# Patient Record
Sex: Female | Born: 1960 | Race: Black or African American | Hispanic: No | Marital: Married | Smoking: Never smoker
Health system: Southern US, Community
[De-identification: ages and names within clinical notes are randomized; demographics above are authoritative.]

## PROBLEM LIST (undated history)

## (undated) DIAGNOSIS — R51 Headache: Secondary | ICD-10-CM

## (undated) DIAGNOSIS — G8929 Other chronic pain: Secondary | ICD-10-CM

## (undated) DIAGNOSIS — E119 Type 2 diabetes mellitus without complications: Secondary | ICD-10-CM

## (undated) DIAGNOSIS — T7840XA Allergy, unspecified, initial encounter: Secondary | ICD-10-CM

## (undated) DIAGNOSIS — R519 Headache, unspecified: Secondary | ICD-10-CM

## (undated) DIAGNOSIS — D649 Anemia, unspecified: Secondary | ICD-10-CM

## (undated) DIAGNOSIS — I10 Essential (primary) hypertension: Secondary | ICD-10-CM

## (undated) DIAGNOSIS — E7800 Pure hypercholesterolemia, unspecified: Secondary | ICD-10-CM

## (undated) HISTORY — DX: Anemia, unspecified: D64.9

## (undated) HISTORY — DX: Type 2 diabetes mellitus without complications: E11.9

## (undated) HISTORY — DX: Pure hypercholesterolemia, unspecified: E78.00

## (undated) HISTORY — DX: Headache, unspecified: R51.9

## (undated) HISTORY — DX: Headache: R51

## (undated) HISTORY — DX: Essential (primary) hypertension: I10

## (undated) HISTORY — PX: TONSILLECTOMY: SUR1361

## (undated) HISTORY — DX: Allergy, unspecified, initial encounter: T78.40XA

## (undated) HISTORY — DX: Other chronic pain: G89.29

## (undated) HISTORY — PX: TONSILECTOMY/ADENOIDECTOMY WITH MYRINGOTOMY: SHX6125

---
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
## 1994-01-09 HISTORY — PX: TUBAL LIGATION: SHX77

## 2004-03-10 ENCOUNTER — Ambulatory Visit: Payer: Self-pay | Admitting: Internal Medicine

## 2004-08-05 ENCOUNTER — Ambulatory Visit: Payer: Self-pay | Admitting: Internal Medicine

## 2005-05-24 ENCOUNTER — Ambulatory Visit: Payer: Self-pay | Admitting: Internal Medicine

## 2006-11-06 ENCOUNTER — Ambulatory Visit: Payer: Self-pay | Admitting: Internal Medicine

## 2006-11-21 ENCOUNTER — Emergency Department: Payer: Self-pay | Admitting: Emergency Medicine

## 2007-11-27 ENCOUNTER — Ambulatory Visit: Payer: Self-pay | Admitting: Internal Medicine

## 2007-12-09 ENCOUNTER — Ambulatory Visit: Payer: Self-pay | Admitting: Internal Medicine

## 2008-03-02 ENCOUNTER — Ambulatory Visit: Payer: Self-pay | Admitting: Unknown Physician Specialty

## 2008-03-12 ENCOUNTER — Ambulatory Visit: Payer: Self-pay | Admitting: Unknown Physician Specialty

## 2008-03-27 ENCOUNTER — Observation Stay: Payer: Self-pay | Admitting: Unknown Physician Specialty

## 2008-04-01 ENCOUNTER — Inpatient Hospital Stay: Payer: Self-pay | Admitting: Unknown Physician Specialty

## 2008-12-23 ENCOUNTER — Ambulatory Visit: Payer: Self-pay | Admitting: Internal Medicine

## 2009-01-09 HISTORY — PX: ABDOMINAL HYSTERECTOMY: SHX81

## 2009-12-29 ENCOUNTER — Ambulatory Visit: Payer: Self-pay | Admitting: Internal Medicine

## 2011-01-25 ENCOUNTER — Ambulatory Visit: Payer: Self-pay | Admitting: Unknown Physician Specialty

## 2011-12-01 ENCOUNTER — Telehealth: Payer: Self-pay | Admitting: Internal Medicine

## 2011-12-01 MED ORDER — PANTOPRAZOLE SODIUM 40 MG PO TBEC: 40.0000 mg | DELAYED_RELEASE_TABLET | Freq: Every day | ORAL | Status: DC

## 2011-12-01 NOTE — Telephone Encounter (Signed)
Called patient to let her know. Appt scheduled. Prescription sent in to [facility] and also [facility]

## 2011-12-01 NOTE — Telephone Encounter (Signed)
Pt called made appointment for march  Wanted  to see if she could get in sooner.  She is not feeling well. Low energy having pains in chest (acid reflex)  Pt had endoscopy in April Pt is out of proton ix [facility] [place]

## 2011-12-01 NOTE — Telephone Encounter (Signed)
I can see her on 12/04/11 at 8:30 - will need to put on schedule.  Will need 30 min.  I am ok to refill her protonix 40mg .  Need to confirm with pt she was taking q day.  Can refill x 3.  If she is having chest pain however, I do recommend her to go ahead to acute care and be evaluated today to confirm nothing more acute going on and then I can follow up with her on Monday.

## 2011-12-04 ENCOUNTER — Ambulatory Visit (INDEPENDENT_AMBULATORY_CARE_PROVIDER_SITE_OTHER): Payer: BC Managed Care – PPO | Admitting: Internal Medicine

## 2011-12-04 ENCOUNTER — Encounter: Payer: Self-pay | Admitting: Internal Medicine

## 2011-12-04 VITALS — BP 120/70 | HR 74 | Temp 98.6°F | Ht 60.0 in | Wt 185.2 lb

## 2011-12-04 DIAGNOSIS — R109 Unspecified abdominal pain: Secondary | ICD-10-CM

## 2011-12-04 DIAGNOSIS — R5381 Other malaise: Secondary | ICD-10-CM

## 2011-12-04 DIAGNOSIS — E119 Type 2 diabetes mellitus without complications: Secondary | ICD-10-CM

## 2011-12-04 DIAGNOSIS — E7800 Pure hypercholesterolemia, unspecified: Secondary | ICD-10-CM

## 2011-12-04 DIAGNOSIS — R5383 Other fatigue: Secondary | ICD-10-CM

## 2011-12-04 DIAGNOSIS — G4710 Hypersomnia, unspecified: Secondary | ICD-10-CM

## 2011-12-04 DIAGNOSIS — D649 Anemia, unspecified: Secondary | ICD-10-CM

## 2011-12-04 DIAGNOSIS — R400 Somnolence: Secondary | ICD-10-CM

## 2011-12-04 DIAGNOSIS — R079 Chest pain, unspecified: Secondary | ICD-10-CM

## 2011-12-04 DIAGNOSIS — I10 Essential (primary) hypertension: Secondary | ICD-10-CM

## 2011-12-04 DIAGNOSIS — K219 Gastro-esophageal reflux disease without esophagitis: Secondary | ICD-10-CM

## 2011-12-04 LAB — CBC WITH DIFFERENTIAL/PLATELET
Basophils Absolute: 0 10*3/uL (ref 0.0–0.1)
Basophils Relative: 0.3 % (ref 0.0–3.0)
Eosinophils Relative: 1.2 % (ref 0.0–5.0)
Hemoglobin: 12.4 g/dL (ref 12.0–15.0)
Lymphocytes Relative: 42.4 % (ref 12.0–46.0)
Monocytes Relative: 5.3 % (ref 3.0–12.0)
Neutro Abs: 2.8 10*3/uL (ref 1.4–7.7)
RBC: 4.3 Mil/uL (ref 3.87–5.11)
WBC: 5.6 10*3/uL (ref 4.5–10.5)

## 2011-12-04 LAB — BASIC METABOLIC PANEL
CO2: 29 mEq/L (ref 19–32)
Chloride: 103 mEq/L (ref 96–112)
Creatinine, Ser: 0.8 mg/dL (ref 0.4–1.2)
Glucose, Bld: 110 mg/dL — ABNORMAL HIGH (ref 70–99)

## 2011-12-04 LAB — LDL CHOLESTEROL, DIRECT: Direct LDL: 165.7 mg/dL

## 2011-12-04 LAB — LIPID PANEL
Cholesterol: 219 mg/dL — ABNORMAL HIGH (ref 0–200)
Total CHOL/HDL Ratio: 6
Triglycerides: 111 mg/dL (ref 0.0–149.0)
VLDL: 22.2 mg/dL (ref 0.0–40.0)

## 2011-12-04 LAB — HEPATIC FUNCTION PANEL
ALT: 14 U/L (ref 0–35)
Bilirubin, Direct: 0 mg/dL (ref 0.0–0.3)
Total Protein: 7.7 g/dL (ref 6.0–8.3)

## 2011-12-04 MED ORDER — PANTOPRAZOLE SODIUM 40 MG PO TBEC: 40.0000 mg | DELAYED_RELEASE_TABLET | Freq: Every day | ORAL | Status: DC

## 2011-12-04 MED ORDER — FLUTICASONE PROPIONATE 50 MCG/ACT NA SUSP: 2.0000 | Freq: Every day | NASAL | Status: DC

## 2011-12-04 MED ORDER — FELODIPINE ER 5 MG PO TB24: 5.0000 mg | ORAL_TABLET | Freq: Every day | ORAL | Status: DC

## 2011-12-04 NOTE — Patient Instructions (Addendum)
It was nice seeing you today.  I am sorry you have not been feeling well.  I want you to continue your Protonix.  We will get your sleep study, stress test and referral to GI - scheduled.  Let me know if you have any problems.

## 2011-12-04 NOTE — Progress Notes (Signed)
Subjective:    Patient ID: Courtney Donovan, female    DOB: 13-Feb-1960, 51 y.o.   MRN: [id number]  HPI 51 year old female with past history of hypertension, hypercholesterolemia, anemia and GERD who comes in today as a work in with concerns regarding increased acid reflux.  She reports she has been out of her protonix.  Had EGD 4/13 that revealed mild gastritis.  She has noticed burning in her chest and food "coming up".  When she would eat - would feel bad.  Feels better now that she is back on Protonix.  Still with some faint intermittent chest discomfort.  She has also noticed getting more sob with going up stairs.  Will notice some tightness occasionally with exertion - as well.  Occasional nausea, no vomiting.  Does report she feels she has a belt around her lower back and lower abdomen - at times.  Bowels stable.   Not sleeping well.  Wakes up - not feeling rested.  No increased cough.  Headaches started two weeks ago.    Past Medical History  Diagnosis Date  . Hypertension   . Hypercholesterolemia   . Chronic headaches   . Anemia   . Diabetes mellitus     diet controlled   .   Review of Systems Patient denies any lightheadedness or dizziness currently.  Headache as outlined.  Frontal.  No vision change.  No significant sinus congestion.  Some nasal dryness.  Planning to get her eyes checked soon.  Chest pain as outlined.  No palpitations.  No increased cough or congestion.  Does report some sob with exertion.  No vomiting. Does report some intermittent nausea.  Abdominal discomfort as outlined.  No bowel change, such as diarrhea, constipation, BRBPR or melana.  No urine change.  Acid reflux better now that she has restarted the Protonix.       Objective:   Physical Exam Filed Vitals:   12/04/11 0841  BP: 120/70  Pulse: 74  Temp: 98.6 F (59 C)   51 year old female in no acute distress.   HEENT:  Nares - clear.  OP- without lesions or erythema.  Minimal tenderness to  palpation over the sinus.  No temporal artery tenderness.  TMs without erythema.   NECK:  Supple, nontender.  No audible bruit.   HEART:  Appears to be regular. LUNGS:  Without crackles or wheezing audible.  Respirations even and unlabored.   RADIAL PULSE:  Equal bilaterally.  ABDOMEN:  Soft, nontender.  No audible abdominal bruit.   EXTREMITIES:  No increased edema to be present.                     Assessment & Plan:  CARDIOVASCULAR.  With the chest discomfort as outlined - EKG obtained and revealed SR with no acute ischemic changes.  Given symptoms and risk factors, will go ahead and obtain a stress test to confirm no ischemia.  Continue risk factor modification.    POSSIBLE SLEEP APNEA.  Describes headaches.  Does not feel rested when she wakes.  Daytime somnolence.  Obtain split night sleep study to further evaluate.    FATIGUE.  Check cbc, met c and tsh.   INCREASED PSYCHOSOCIAL STRESSORS.  Off Citalopram.  Discussed restarting.  Follow.    GYN.  Is s/p supracervical hysterectomy.  Previously saw Dr Harold Hedge.  States she is up to date with her pelvics and paps.  Follow.    HEALTH MAINTENANCE.  Pelvic and pap -  through GYN.  Need copy of last mammogram.  Was referred to GI for colon cancer screening - since due.  They performed an EGD.  Will refer back for a colonoscopy.

## 2011-12-05 ENCOUNTER — Telehealth: Payer: Self-pay | Admitting: Internal Medicine

## 2011-12-05 NOTE — Telephone Encounter (Signed)
Need to add an a1c (hyperglycemia - 790.6) to her labs yesterday.  Thanks.

## 2011-12-06 ENCOUNTER — Other Ambulatory Visit (INDEPENDENT_AMBULATORY_CARE_PROVIDER_SITE_OTHER): Payer: BC Managed Care – PPO

## 2011-12-06 ENCOUNTER — Other Ambulatory Visit: Payer: Self-pay | Admitting: Internal Medicine

## 2011-12-06 DIAGNOSIS — E119 Type 2 diabetes mellitus without complications: Secondary | ICD-10-CM

## 2011-12-06 DIAGNOSIS — E7800 Pure hypercholesterolemia, unspecified: Secondary | ICD-10-CM

## 2011-12-06 LAB — HEMOGLOBIN A1C: Hgb A1c MFr Bld: 6.4 % (ref 4.6–6.5)

## 2011-12-08 ENCOUNTER — Telehealth: Payer: Self-pay | Admitting: *Deleted

## 2011-12-08 MED ORDER — PRAVASTATIN SODIUM 10 MG PO TABS: 10.0000 mg | ORAL_TABLET | Freq: Every day | ORAL | Status: DC

## 2011-12-08 NOTE — Telephone Encounter (Signed)
Appointment scheduled. Sent prescription in to pharmacy.

## 2011-12-08 NOTE — Progress Notes (Signed)
Called and gave lab results to patient. Appointment scheduled. Sent prescription in to pharmacy.

## 2011-12-09 ENCOUNTER — Encounter: Payer: Self-pay | Admitting: Internal Medicine

## 2011-12-09 DIAGNOSIS — E119 Type 2 diabetes mellitus without complications: Secondary | ICD-10-CM | POA: Insufficient documentation

## 2011-12-09 DIAGNOSIS — K219 Gastro-esophageal reflux disease without esophagitis: Secondary | ICD-10-CM | POA: Insufficient documentation

## 2011-12-09 NOTE — Assessment & Plan Note (Signed)
Worsening reflux recently.  EGD 4/13 revealed mild gastritis.  She recently was out of her Protonix.  Noticed burning and food coming up. Back on Protonix now.  Better.  Follow.

## 2011-12-09 NOTE — Assessment & Plan Note (Signed)
Check cbc.  Off iron.  Is s/p hysterectomy now.  Follow. GI w/up planned and as outlined.

## 2011-12-09 NOTE — Assessment & Plan Note (Signed)
Low cholesterol diet and exercise.  Check lipid panel with next labs.  

## 2011-12-09 NOTE — Assessment & Plan Note (Signed)
Blood pressure as outlined.  Have her spot check her pressures.  Get her back in soon to reassess.  Check metabolic panel.

## 2011-12-09 NOTE — Assessment & Plan Note (Signed)
Diet controlled.  Low carb diet and exercise.  Check met b and a1c.    

## 2011-12-11 ENCOUNTER — Emergency Department: Payer: Self-pay | Admitting: Emergency Medicine

## 2011-12-11 LAB — HEPATIC FUNCTION PANEL A (ARMC)
Albumin: 3.9 g/dL (ref 3.4–5.0)
Alkaline Phosphatase: 94 U/L (ref 50–136)
Bilirubin, Direct: <0.1 mg/dL (ref 0.00–0.20)
Bilirubin,Total: 0.2 mg/dL (ref 0.2–1.0)
SGOT(AST): 16 U/L (ref 15–37)
SGPT (ALT): 18 U/L (ref 12–78)
Total Protein: 7.6 g/dL (ref 6.4–8.2)

## 2011-12-11 LAB — BASIC METABOLIC PANEL
Anion Gap: 4 — ABNORMAL LOW (ref 7–16)
Calcium, Total: 8.3 mg/dL — ABNORMAL LOW (ref 8.5–10.1)
Chloride: 105 mmol/L (ref 98–107)
Co2: 28 mmol/L (ref 21–32)
Osmolality: 274 (ref 275–301)
Potassium: 3.9 mmol/L (ref 3.5–5.1)

## 2011-12-11 LAB — CK TOTAL AND CKMB (NOT AT ARMC)
CK, Total: 118 U/L (ref 21–215)
CK-MB: 0.8 ng/mL (ref 0.5–3.6)

## 2011-12-11 LAB — CBC
HCT: 35.7 % (ref 35.0–47.0)
HGB: 12 g/dL (ref 12.0–16.0)
MCH: 29.2 pg (ref 26.0–34.0)
MCHC: 33.5 g/dL (ref 32.0–36.0)
MCV: 87 fL (ref 80–100)
Platelet: 215 10*3/uL (ref 150–440)
RBC: 4.1 10*6/uL (ref 3.80–5.20)
RDW: 13.5 % (ref 11.5–14.5)
WBC: 9.4 10*3/uL (ref 3.6–11.0)

## 2011-12-11 LAB — TROPONIN I: Troponin-I: <0.02 ng/mL

## 2011-12-12 ENCOUNTER — Telehealth: Payer: Self-pay | Admitting: Internal Medicine

## 2011-12-12 NOTE — Telephone Encounter (Signed)
Please call and confirm pt doing ok and let her know we are waiting on records.  Hold message until we receive

## 2011-12-12 NOTE — Telephone Encounter (Signed)
Pt was recently in [facility] and wanted to know if a nurse could call her back.

## 2011-12-12 NOTE — Telephone Encounter (Signed)
Patient was taken to [facility] 12/11/11 with chest pains.  [facility] performed an EKG and lab work, which came back as ok. [facility] wanted patient to follow-up with you. Faxed request for er records.

## 2011-12-13 NOTE — Telephone Encounter (Signed)
Called patient, she is agreeable for the 12:00 pm appointment for 12/5

## 2011-12-13 NOTE — Telephone Encounter (Signed)
Pt calling back and states that she is going to try to go back to work today and would like a call as soon as possible.

## 2011-12-13 NOTE — Telephone Encounter (Signed)
She said that she was going to rest today and tomorrow's appt was fine.

## 2011-12-13 NOTE — Telephone Encounter (Signed)
Please call pt and inform her we were waiting on records and make sure she is doing ok - see yesterdays message. I can see her tomorrow at 12:00 - workin for ER follow up.  Will need to schedule appt or send to robin or jamie to put on schedule.  If any acute problems - let me know.  She needs a call toay.

## 2011-12-13 NOTE — Telephone Encounter (Signed)
Was she fine with waiting until tomorrow.  If not, then acute care today - since I am not here this pm.

## 2011-12-13 NOTE — Telephone Encounter (Signed)
Patient is still having same sx,

## 2011-12-14 ENCOUNTER — Encounter: Payer: Self-pay | Admitting: Internal Medicine

## 2011-12-14 ENCOUNTER — Ambulatory Visit (INDEPENDENT_AMBULATORY_CARE_PROVIDER_SITE_OTHER): Payer: BC Managed Care – PPO | Admitting: Internal Medicine

## 2011-12-14 ENCOUNTER — Encounter: Payer: Self-pay | Admitting: General Practice

## 2011-12-14 ENCOUNTER — Other Ambulatory Visit: Payer: Self-pay | Admitting: *Deleted

## 2011-12-14 VITALS — BP 150/100 | HR 75 | Temp 98.9°F | Ht 60.0 in | Wt 181.5 lb

## 2011-12-14 DIAGNOSIS — E119 Type 2 diabetes mellitus without complications: Secondary | ICD-10-CM

## 2011-12-14 DIAGNOSIS — K219 Gastro-esophageal reflux disease without esophagitis: Secondary | ICD-10-CM

## 2011-12-14 DIAGNOSIS — D649 Anemia, unspecified: Secondary | ICD-10-CM

## 2011-12-14 DIAGNOSIS — I10 Essential (primary) hypertension: Secondary | ICD-10-CM

## 2011-12-14 DIAGNOSIS — R079 Chest pain, unspecified: Secondary | ICD-10-CM

## 2011-12-14 MED ORDER — LORAZEPAM 0.5 MG PO TABS: ORAL_TABLET | ORAL | Status: DC

## 2011-12-14 NOTE — Progress Notes (Signed)
Subjective:    Patient ID: Courtney Donovan, female    DOB: 03-Dec-1960, 51 y.o.   MRN: [id number]  HPI  51 year old female with past history of hypertension, hypercholesterolemia, anemia and GERD who comes in today as a work in for an ER follow up.  She was seen in the ER on 12/11/11 for near syncope. She reports she was driving home from work and felt a sensation in her lower abdomen that came up over her.  Felt like she was going to pass out.  EMS called.  Unsure if she actually lost consciousness.  She was able to talk and answer questions, but she states she did not feel right and was out of sorts.  Was taken to the ER.  Lab work unrevealing.  EKG revealed nonspecific T wave abnormality in the anterior leads and prolonged QT.  Was discharged to follow up here.  On questioning her, she has had intermittent chest discomfort - mostly right sided.  Some intermittent palpitations and increased heart rate.  (started occurring prior to this episode).  Will feel a little light headed when this occurs.  She is still not back to her baseline.  She did have two episodes of emesis when she got home from the ER.  Has been eating bland foods since.  Has had not further emesis.  No diarrhea.  She had started back on her Celexa - the day this occurred.  ER instructed her to stop.    Past Medical History  Diagnosis Date  . Hypertension   . Hypercholesterolemia   . Chronic headaches   . Anemia   . Diabetes mellitus     diet controlled   . Current Outpatient Prescriptions on File Prior to Visit  Medication Sig Dispense Refill  . aspirin 81 MG chewable tablet Chew 81 mg by mouth daily.      . felodipine (PLENDIL) 5 MG 24 hr tablet Take 1 tablet (5 mg total) by mouth daily.  90 tablet  3  . fluticasone (FLONASE) 50 MCG/ACT nasal spray Place 2 sprays into the nose daily.  16 g  0  . Fluticasone Propionate (FLONASE NA) Place into the nose.      . pantoprazole (PROTONIX) 40 MG tablet Take 1 tablet (40 mg  total) by mouth daily.  90 tablet  3  . pravastatin (PRAVACHOL) 10 MG tablet Take 1 tablet (10 mg total) by mouth daily.  30 tablet  1      Review of Systems Has had some minimal headache.  No vision change or sinus symptoms.  She does describe the intermittent chest pain and palpitatioins as outlined.   No increased shortness of breath, cough or congestion.  Had the nausea and emesis as outlined.  Had been having increased acid reflux.  See last note for details.  Back on Protonix now.  Helping.  No bowel change, such as diarrhea, constipation, BRBPR or melana.  No urine change.        Objective:   Physical Exam  Filed Vitals:   12/14/11 1208  BP: 150/100  Pulse: 75  Temp: 98.9 F (37.2 C)   Blood pressure recheck:  6/71  51 year old female in no acute distress.   HEENT:  Nares - clear.  OP- without lesions or erythema.  No temporal artery tenderness.  TMs without erythema.   NECK:  Supple, nontender.  No audible bruit.   HEART:  Appears to be regular. LUNGS:  Without crackles or wheezing audible.  Respirations even and unlabored.   RADIAL PULSE:  Equal bilaterally.  ABDOMEN:  Soft, nontender.  No audible abdominal bruit.   EXTREMITIES:  No increased edema to be present.                     Assessment & Plan:  CARDIOVASCULAR.  Symptoms as outlined.  Given concern over possible prolonged QT, EKG repeated today.  Revealed ST with no prolonged QT and no acute ischemic changes.  Unclear as to the exact etiology.  Given the persistent intermittent chest pain, palpitations, increased heart rate and associated light headedness - I do feel further cardiac w/up is warranted.  We had already discussed doing a stress echo - last visit.  Will instead, refer her to cardiology and let them evaluate her prior to any testing - for consideration of stress test, holter monitor and/or ECHO.  Pt comfortable with this plan.  Knows if she has any worsening change or problems, she is to be reevaluated.  Treat the anxiety and GERD as outlined.  I spent more that 25 minutes with the patient and more than 50% of the time was spent in consultation regarding the above.    ANXIETY.  We discussed the possibility of some of this being related to anxiety (or at least worsened by anxiety).  Will hold on the Celexa for now, until we can get the above worked up - regarding her heart.  I did give her a Rx for Lorazepam .5mg  (instructed to take 1/2 tab bid prn) - if needed.  Will follow closely.    POSSIBLE VIRAL SYNDROME.  She did have some nausea and emesis associated with the above episode.  Eating bland foods now.  No diarrhea.  Advance diet as tolerated.  Follow.  Stay hydrated. If persistent flares, consider abdominal ultrasound to confirm no gallbladder etiology, etc.      POSSIBLE SLEEP APNEA.  See last visit.  Scheduled for a split night sleep study.       HEALTH MAINTENANCE.  Pelvic and pap - through GYN.  Need copy of last mammogram.  Was referred to GI for colon cancer screening - since due.  They performed an EGD.  Will refer back for a colonoscopy.

## 2011-12-14 NOTE — Telephone Encounter (Signed)
Sent in to pharmacy.  

## 2011-12-15 ENCOUNTER — Encounter: Payer: Self-pay | Admitting: Internal Medicine

## 2011-12-15 NOTE — Assessment & Plan Note (Signed)
Had worsening acid reflux recently.  Back on Protonix now.  Symptoms have improved.  Already referred back to GI.  Follow.  Question if any of this could have contributed to her recent "episode".

## 2011-12-15 NOTE — Assessment & Plan Note (Signed)
Recent cbc wnl.  Follow.   

## 2011-12-15 NOTE — Assessment & Plan Note (Signed)
Blood pressure looks good on my check.  Continue same meds.  Follow.

## 2011-12-15 NOTE — Assessment & Plan Note (Signed)
Sugars controlled.  Follow.  No lows.

## 2011-12-25 ENCOUNTER — Telehealth: Payer: Self-pay | Admitting: Internal Medicine

## 2011-12-25 NOTE — Telephone Encounter (Signed)
I don't make referrals to chiropractor.  Why does she want to cx sleep study.  i would like for her to follow through with sleep study.  If having more msk issues - I can reassess and see about referral to physical therapy.

## 2011-12-25 NOTE — Telephone Encounter (Signed)
Has not meet her deductible, she wants to wait until after new year so she want have to pay twice for same treatment.  She wants to know what she has to do to get sleep study done in home. She is still having head and neck pain that is worse. She said that there are xr's and reports in her records.

## 2011-12-25 NOTE — Telephone Encounter (Signed)
Pt is needing referral to Chiropractor and she wants to cancel sleep study and wanted to let Dr. Lorin Picket know.

## 2011-12-26 NOTE — Telephone Encounter (Signed)
Reviewed messages.  I can refer her to physical therapy for neck pain.  Let me know if she is agreeable to this and i can place order for referral.  She cannot get a full sleep study at home.  What can be done at home is an overnight oximetry - to measure oxygen level while she is sleeping.  If she is unable to get the sleep study now, I can talk with her more about this at next visit.  Notify her to avoid lying flat on her back and avoid sedating medication.

## 2011-12-26 NOTE — Telephone Encounter (Signed)
Called patient to let her know. She said that she would get back in touch with [place] to let us know if she wanted physical therapy.

## 2012-01-19 ENCOUNTER — Other Ambulatory Visit: Payer: BC Managed Care – PPO

## 2012-01-29 ENCOUNTER — Other Ambulatory Visit (INDEPENDENT_AMBULATORY_CARE_PROVIDER_SITE_OTHER): Payer: BC Managed Care – PPO

## 2012-01-29 ENCOUNTER — Ambulatory Visit: Payer: Self-pay | Admitting: Internal Medicine

## 2012-01-29 DIAGNOSIS — E7800 Pure hypercholesterolemia, unspecified: Secondary | ICD-10-CM

## 2012-01-29 LAB — HEPATIC FUNCTION PANEL
ALT: 16 U/L (ref 0–35)
AST: 17 U/L (ref 0–37)
Alkaline Phosphatase: 78 U/L (ref 39–117)
Bilirubin, Direct: 0 mg/dL (ref 0.0–0.3)
Total Bilirubin: 0.7 mg/dL (ref 0.3–1.2)
Total Protein: 7.7 g/dL (ref 6.0–8.3)

## 2012-01-29 LAB — LIPID PANEL
Cholesterol: 195 mg/dL (ref 0–200)
VLDL: 19.6 mg/dL (ref 0.0–40.0)

## 2012-01-30 ENCOUNTER — Encounter: Payer: Self-pay | Admitting: Internal Medicine

## 2012-01-30 DIAGNOSIS — K219 Gastro-esophageal reflux disease without esophagitis: Secondary | ICD-10-CM

## 2012-01-31 ENCOUNTER — Other Ambulatory Visit: Payer: Self-pay | Admitting: *Deleted

## 2012-01-31 MED ORDER — PRAVASTATIN SODIUM 20 MG PO TABS: 20.0000 mg | ORAL_TABLET | Freq: Every day | ORAL | Status: DC

## 2012-02-07 ENCOUNTER — Encounter: Payer: Self-pay | Admitting: Internal Medicine

## 2012-03-08 ENCOUNTER — Encounter: Payer: Self-pay | Admitting: *Deleted

## 2012-03-12 ENCOUNTER — Ambulatory Visit: Payer: Self-pay | Admitting: Internal Medicine

## 2012-03-12 ENCOUNTER — Telehealth: Payer: Self-pay | Admitting: *Deleted

## 2012-03-12 NOTE — Telephone Encounter (Signed)
Patient wants to know what you would recommend for her to take over the counter for headache, sinus drainage, and sore nose. Patient stated that she does not have any other symptoms.

## 2012-03-12 NOTE — Telephone Encounter (Signed)
Patient called to speak with Marcelino Duster. She did not state what the call was regarding. Will route

## 2012-03-12 NOTE — Telephone Encounter (Signed)
She can use her flonase nasal spray - 2 sprays each nostril q day - do in the evening.  Also saline nasal flushes - flush nose at least 2-3 x/day.  If persistent sx, needs evaluation.  She missed her appt today.

## 2012-03-12 NOTE — Telephone Encounter (Signed)
Left doctors recommendation on patients vm

## 2012-04-04 ENCOUNTER — Other Ambulatory Visit: Payer: Self-pay | Admitting: *Deleted

## 2012-04-05 ENCOUNTER — Other Ambulatory Visit: Payer: Self-pay | Admitting: *Deleted

## 2012-04-05 MED ORDER — LORAZEPAM 0.5 MG PO TABS: ORAL_TABLET | ORAL | Status: DC

## 2012-04-05 MED ORDER — PANTOPRAZOLE SODIUM 40 MG PO TBEC: 40.0000 mg | DELAYED_RELEASE_TABLET | Freq: Every day | ORAL | Status: DC

## 2012-04-05 NOTE — Telephone Encounter (Signed)
Med filled.  

## 2012-04-12 ENCOUNTER — Other Ambulatory Visit: Payer: Self-pay | Admitting: *Deleted

## 2012-04-12 ENCOUNTER — Telehealth: Payer: Self-pay | Admitting: Internal Medicine

## 2012-04-12 MED ORDER — PANTOPRAZOLE SODIUM 40 MG PO TBEC: 40.0000 mg | DELAYED_RELEASE_TABLET | Freq: Every day | ORAL | Status: DC

## 2012-04-12 NOTE — Telephone Encounter (Signed)
Pt was wanting to know if you could call her in some Protinics 40 mg to [facility] until she recieves from [facility] because she is having chest pains and she is completely out.

## 2012-04-12 NOTE — Telephone Encounter (Addendum)
Patient wanted to know if you could fit her in on 4/17 in the morning?

## 2012-04-12 NOTE — Telephone Encounter (Signed)
I am ok to refill her med. She does need a follow up appt with me.  Ok for her to continue her cholesterol med.

## 2012-04-12 NOTE — Telephone Encounter (Addendum)
Patient said that she is having indigestion. Patient was wanting enough to get her by until mail order comes. Patient also wanted to know if it is ok for her to take her cholesterol med with vivelle patch 0.05 mg? Another physician that she is seeing put her on the patch and she says that she is doing fine on it but she has not been taking her cholesterol med since she stated the patch.

## 2012-04-12 NOTE — Addendum Note (Signed)
Addended by: Marlene Lard on: 04/12/2012 05:03 PM   Modules accepted: Orders

## 2012-04-12 NOTE — Telephone Encounter (Signed)
Rx sent in to pharmacy. 

## 2012-04-12 NOTE — Telephone Encounter (Signed)
I am ok to refill her protonix, but need to know more about her chest pain.  If having acute chest pain - needs eval today.  Also she needs a follow up appt with me.

## 2012-04-14 NOTE — Telephone Encounter (Signed)
I can see her at 11:45 on 04/25/12.  Will need to block the 9:45 spot.  Thanks.

## 2012-04-15 ENCOUNTER — Other Ambulatory Visit: Payer: Self-pay | Admitting: *Deleted

## 2012-04-15 NOTE — Telephone Encounter (Signed)
She was just given 20 on 04/05/12.  How often is she taking?  Let me know.

## 2012-04-25 ENCOUNTER — Ambulatory Visit (INDEPENDENT_AMBULATORY_CARE_PROVIDER_SITE_OTHER): Payer: BC Managed Care – PPO | Admitting: Internal Medicine

## 2012-04-25 ENCOUNTER — Encounter: Payer: Self-pay | Admitting: Internal Medicine

## 2012-04-25 VITALS — BP 100/60 | HR 83 | Temp 98.5°F | Ht 60.0 in | Wt 188.5 lb

## 2012-04-25 DIAGNOSIS — I10 Essential (primary) hypertension: Secondary | ICD-10-CM

## 2012-04-25 DIAGNOSIS — E119 Type 2 diabetes mellitus without complications: Secondary | ICD-10-CM

## 2012-04-25 DIAGNOSIS — E7800 Pure hypercholesterolemia, unspecified: Secondary | ICD-10-CM

## 2012-04-25 DIAGNOSIS — D649 Anemia, unspecified: Secondary | ICD-10-CM

## 2012-04-25 DIAGNOSIS — K219 Gastro-esophageal reflux disease without esophagitis: Secondary | ICD-10-CM

## 2012-04-27 ENCOUNTER — Encounter: Payer: Self-pay | Admitting: Internal Medicine

## 2012-04-27 NOTE — Assessment & Plan Note (Signed)
Recent cbc wnl.  Follow.   

## 2012-04-27 NOTE — Assessment & Plan Note (Signed)
Blood pressure looks good on my check.  Continue same meds.  Follow.

## 2012-04-27 NOTE — Assessment & Plan Note (Signed)
No reflux.  Doing well on current regimen.  Follow.

## 2012-04-27 NOTE — Progress Notes (Signed)
  Subjective:    Patient ID: Courtney Donovan, female    DOB: 1960/04/16, 52 y.o.   MRN: [id number]  HPI 52 year old female with past history of hypertension, hypercholesterolemia, anemia and GERD who comes in today for a scheduled follow up.  She states she is doing better.  Feels better.  Using Vivelle dot.  Sleeping better.  No chest pain or tightness.  Breathing stable.  No acid reflux.  Bowels stable.    Past Medical History  Diagnosis Date  . Hypertension   . Hypercholesterolemia   . Chronic headaches   . Anemia   . Diabetes mellitus     diet controlled  . Allergy    . Current Outpatient Prescriptions on File Prior to Visit  Medication Sig Dispense Refill  . aspirin 81 MG chewable tablet Chew 81 mg by mouth daily.      . felodipine (PLENDIL) 5 MG 24 hr tablet Take 1 tablet (5 mg total) by mouth daily.  90 tablet  3  . fluticasone (FLONASE) 50 MCG/ACT nasal spray Place 2 sprays into the nose daily.  16 g  0  . LORazepam (ATIVAN) 0.5 MG tablet 1/2 tablet bid prn  20 tablet  0  . pantoprazole (PROTONIX) 40 MG tablet Take 1 tablet (40 mg total) by mouth daily.  15 tablet  0   No current facility-administered medications on file prior to visit.      Review of Systems Patient denies any headache, lightheadedness or dizziness.  No sinus or allergy symptoms.  No chest pain, tightness or palpitations.  No increased shortness of breath, cough or congestion.  No nausea or vomiting.  No abdominal pain or cramping.  No bowel change, such as diarrhea, constipation, BRBPR or melana.  No urine change.  Sleeping better.  Seeing GYN.         Objective:   Physical Exam  Filed Vitals:   04/25/12 1157  BP: 100/60  Pulse: 83  Temp: 98.5 F (36.9 C)   Blood pressure recheck:  112/62, pulse 22  52 year old female in no acute distress.   HEENT:  Nares - clear.  OP- without lesions or erythema.   NECK:  Supple, nontender.  No audible bruit.   HEART:  Appears to be regular. LUNGS:   Without crackles or wheezing audible.  Respirations even and unlabored.   RADIAL PULSE:  Equal bilaterally.  ABDOMEN:  Soft, nontender.  No audible abdominal bruit.   EXTREMITIES:  No increased edema to be present.                     Assessment & Plan:  CARDIOVASCULAR.  Asymptomatic.  Continue risk factor modification.      ANXIETY. Doing better. Sleeping better.  Follow.      POSSIBLE SLEEP APNEA.  See previous visits.  Was scheduled for a split night sleep study.  They have contacted her.  She has not scheduled.  Sleeping better now.         HEALTH MAINTENANCE.  Pelvic and pap - through GYN.  Mammogram 01/29/12 - Birads I.  Was referred to GI for colon cancer screening - since due.  They performed an EGD.  Was referred back for a colonoscopy.

## 2012-04-27 NOTE — Assessment & Plan Note (Signed)
Diet controlled.  Low carb diet and exercise.  Check met b and a1c.

## 2012-04-27 NOTE — Assessment & Plan Note (Signed)
Low cholesterol diet and exercise.  Lipid panel 01/29/12 revealed total cholesterol 195, triglycerides 98, HDL 44 and LDL 130.  Off pravastatin.  Wants to try diet and exercise.  Follow.  Recheck lipid profile with next fasting labs.

## 2012-05-30 ENCOUNTER — Other Ambulatory Visit: Payer: BC Managed Care – PPO

## 2012-07-01 ENCOUNTER — Encounter: Payer: Self-pay | Admitting: Adult Health

## 2012-07-01 ENCOUNTER — Ambulatory Visit (INDEPENDENT_AMBULATORY_CARE_PROVIDER_SITE_OTHER): Payer: BC Managed Care – PPO | Admitting: Adult Health

## 2012-07-01 VITALS — BP 124/76 | HR 78 | Temp 98.1°F | Resp 12 | Wt 188.0 lb

## 2012-07-01 DIAGNOSIS — R0982 Postnasal drip: Secondary | ICD-10-CM | POA: Insufficient documentation

## 2012-07-01 DIAGNOSIS — K625 Hemorrhage of anus and rectum: Secondary | ICD-10-CM

## 2012-07-01 LAB — BASIC METABOLIC PANEL
BUN: 12 mg/dL (ref 6–23)
Chloride: 103 mEq/L (ref 96–112)
Creatinine, Ser: 0.8 mg/dL (ref 0.4–1.2)

## 2012-07-01 LAB — CBC WITH DIFFERENTIAL/PLATELET
Basophils Relative: 0.3 % (ref 0.0–3.0)
Eosinophils Absolute: 0 10*3/uL (ref 0.0–0.7)
HCT: 37.7 % (ref 36.0–46.0)
Hemoglobin: 12.5 g/dL (ref 12.0–15.0)
Lymphocytes Relative: 28.4 % (ref 12.0–46.0)
MCHC: 33.2 g/dL (ref 30.0–36.0)
MCV: 87.8 fl (ref 78.0–100.0)
Monocytes Absolute: 0.4 10*3/uL (ref 0.1–1.0)
Neutro Abs: 4.4 10*3/uL (ref 1.4–7.7)
RBC: 4.29 Mil/uL (ref 3.87–5.11)

## 2012-07-01 NOTE — Assessment & Plan Note (Signed)
Suspect this may be remnants from recent sinus infection. Continue Flonase nasal spray. Start Claritin 10 mg at bedtime. Dimetapp elixir decongestant for one week. Continue to drink plenty of fluids. RTC if symptoms are not improved within one week

## 2012-07-01 NOTE — Patient Instructions (Addendum)
  Start taking claritin in the evening since it makes you sleepy.  Also take Dimetapp decongestant for 1 week to see if this helps your symptoms.  Continue the flonase nasal spray.  Drink plenty of water to help loosen the secretions.  The blood in your rectum after you had a bowel movement may be coming from an internal hemorrhoid. I am referring you for your screening colonoscopy since you have already turned 52. (Happy Birthday).

## 2012-07-01 NOTE — Assessment & Plan Note (Signed)
Given presentation of symptoms suspect this may be from internal hemorrhoids. Offered to examine patient; however, she stated there was nothing on the outside. Patient wanted to have a screening colonoscopy when she had her endoscopy; however, her insurance would not authorize. Patient turns 52 today. Refer to GI for screening colonoscopy. Check CBC and metabolic panel.

## 2012-07-01 NOTE — Progress Notes (Signed)
Subjective:    Patient ID: Courtney Donovan, female    DOB: 09-25-1960, 52 y.o.   MRN: [id number]  HPI  Patient presents to clinic after being seen at [facility] in Clinic on 06/23/12 for sinus and affect. Completed Z-pack and feels some improvement. No congestion but having persistent post nasal drip which is causing tickle in her throat and coughing. She is drinking tea and sucking on cough drops. She was taking Mucinex but she did not like the way this medication made her feel. She started taking Claritin but this made her feel sleepy. Patient is overall feeling improvement, however, she is concerned about the persistent cough.  Also patient reports seeing some blood when she wiped after a bowel movement. She denies seeing any blood in the toilet. She believes she has hemorrhoids from when she first gave birth over 18 years ago however she doesn't remember the specifics. She is concerned that she might "bleed out". She reports that she tried to call her GI doctor but there was no available appointments until August. She also states that she recently had an endoscopy but her insurance would not let her have a colonoscopy because she was not yet 52 and there were no symptoms. Patient is turning 52 today.   Current Outpatient Prescriptions on File Prior to Visit  Medication Sig Dispense Refill  . aspirin 81 MG chewable tablet Chew 81 mg by mouth daily.      . felodipine (PLENDIL) 5 MG 24 hr tablet Take 1 tablet (5 mg total) by mouth daily.  90 tablet  3  . fluticasone (FLONASE) 50 MCG/ACT nasal spray Place 2 sprays into the nose daily.  16 g  0  . LORazepam (ATIVAN) 0.5 MG tablet 1/2 tablet bid prn  20 tablet  0  . pantoprazole (PROTONIX) 40 MG tablet Take 1 tablet (40 mg total) by mouth daily.  15 tablet  0   No current facility-administered medications on file prior to visit.     Review of Systems  Constitutional: Negative for fever and chills.  HENT: Positive for postnasal drip.  Negative for congestion, sore throat and sinus pressure.   Respiratory: Positive for cough. Negative for shortness of breath and wheezing.   Cardiovascular: Negative for chest pain.  Gastrointestinal: Positive for constipation and anal bleeding. Negative for nausea, abdominal pain and rectal pain.  Psychiatric/Behavioral: The patient is nervous/anxious.      BP 124/76  Pulse 78  Temp(Src) 98.1 F (36.7 C) (Oral)  Resp 12  Wt 188 lb (85.276 kg)  BMI 36.72 kg/m2  SpO2 97%    Objective:   Physical Exam  Constitutional: She is oriented to person, place, and time.  Overweight, pleasant female in no apparent distress  HENT:  Head: Normocephalic and atraumatic.  Right Ear: External ear normal.  Left Ear: External ear normal.  Nose: Nose normal.  Mouth/Throat: Oropharynx is clear and moist. No oropharyngeal exudate.  Eyes: Conjunctivae and EOM are normal. Pupils are equal, round, and reactive to light.  Neck: Normal range of motion. Neck supple. No tracheal deviation present. No thyromegaly present.  Cardiovascular: Normal rate, regular rhythm, normal heart sounds and intact distal pulses.  Exam reveals no gallop and no friction rub.   No murmur heard. Pulmonary/Chest: Effort normal and breath sounds normal. No respiratory distress. She has no wheezes. She has no rales.  Abdominal: Soft. Bowel sounds are normal.  Lymphadenopathy:    She has no cervical adenopathy.  Neurological: She is alert and  oriented to person, place, and time.  Skin: Skin is warm and dry.  Psychiatric: She has a normal mood and affect. Her behavior is normal. Judgment and thought content normal.  Shouldn't anxious about symptoms.          Assessment & Plan:

## 2012-07-11 ENCOUNTER — Telehealth: Payer: Self-pay | Admitting: Internal Medicine

## 2012-07-11 MED ORDER — FLUCONAZOLE 150 MG PO TABS: 150.0000 mg | ORAL_TABLET | Freq: Once | ORAL | Status: DC

## 2012-07-11 NOTE — Telephone Encounter (Signed)
Patient Information:  Caller Name: Graciana  Phone: [phone]  Patient: Courtney Donovan  Gender: Female  DOB: 19-Sep-1960  Age: 52 Years  PCP: Dale Quincy  Pregnant: No  Office Follow Up:  Does the office need to follow up with this patient?: No  Instructions For The Office: N/A  RN Note:  Taking Augmentin prescribed by ENT for sinus infection.  States started Augmentin 07/06/12.  States has developed vaginal irritation and redness.  Per vaginal discharge protocol, emergent symptoms denied; appts in Epic full at this time.  Rx for diflucan 150mg  x 1 po, #1, NR called in to [facility] [phone].  krs/can  Symptoms  Reason For Call & Symptoms: possible yeast infection  Reviewed Health History In EMR: Yes  Reviewed Medications In EMR: Yes  Reviewed Allergies In EMR: Yes  Reviewed Surgeries / Procedures: Yes  Date of Onset of Symptoms: 07/09/2012 OB / GYN:  LMP: Unknown  Guideline(s) Used:  Vaginal Discharge  Disposition Per Guideline:   See Within 3 Days in Office  Reason For Disposition Reached:   Symptoms of a yeast infection" (i.e., itchy, white discharge, not bad smelling) and not improved > 3 days following Care Advice  Advice Given:  N/A  Patient Will Follow Care Advice:  YES

## 2012-07-11 NOTE — Telephone Encounter (Signed)
Pt called and stated that she uses the [facility] on [address]Sent RX in electronically to correct pharmacy

## 2012-08-09 ENCOUNTER — Encounter: Payer: Self-pay | Admitting: *Deleted

## 2012-08-19 NOTE — Telephone Encounter (Signed)
Mailed pt unread message

## 2012-08-26 ENCOUNTER — Ambulatory Visit: Payer: BC Managed Care – PPO | Admitting: Internal Medicine

## 2012-08-29 ENCOUNTER — Ambulatory Visit: Payer: Self-pay | Admitting: Otolaryngology

## 2012-09-13 ENCOUNTER — Telehealth: Payer: Self-pay | Admitting: Internal Medicine

## 2012-09-13 NOTE — Telephone Encounter (Signed)
Pt is asking that you call her regarding some questions she has about a prescription that was prescribed for her in April, Nystatin cream 100,000 units/gram.  Pt asking that you call her cell.

## 2012-09-16 NOTE — Telephone Encounter (Signed)
C/O sweating on thigh area (in creases)- currently has Nystatin cream that was prescribed in April & wants to know if it is safe to use. Please advise-if not wants to know if there is anything OTC that you would recommend.

## 2012-09-16 NOTE — Telephone Encounter (Signed)
Pt notified and to call back with persistence of symptoms.

## 2012-09-16 NOTE — Telephone Encounter (Signed)
It should be ok to use the nystatin cream.  If she has persistent problems, I can evaluate her.

## 2012-10-24 ENCOUNTER — Encounter: Payer: Self-pay | Admitting: Internal Medicine

## 2012-10-24 ENCOUNTER — Telehealth: Payer: Self-pay | Admitting: *Deleted

## 2012-10-24 ENCOUNTER — Ambulatory Visit (INDEPENDENT_AMBULATORY_CARE_PROVIDER_SITE_OTHER): Payer: BC Managed Care – PPO | Admitting: Internal Medicine

## 2012-10-24 VITALS — BP 110/80 | HR 78 | Temp 98.6°F | Ht 60.0 in | Wt 180.8 lb

## 2012-10-24 DIAGNOSIS — D649 Anemia, unspecified: Secondary | ICD-10-CM

## 2012-10-24 DIAGNOSIS — I10 Essential (primary) hypertension: Secondary | ICD-10-CM

## 2012-10-24 DIAGNOSIS — E119 Type 2 diabetes mellitus without complications: Secondary | ICD-10-CM

## 2012-10-24 DIAGNOSIS — R400 Somnolence: Secondary | ICD-10-CM

## 2012-10-24 DIAGNOSIS — G4710 Hypersomnia, unspecified: Secondary | ICD-10-CM

## 2012-10-24 DIAGNOSIS — N951 Menopausal and female climacteric states: Secondary | ICD-10-CM

## 2012-10-24 DIAGNOSIS — R5383 Other fatigue: Secondary | ICD-10-CM

## 2012-10-24 DIAGNOSIS — K625 Hemorrhage of anus and rectum: Secondary | ICD-10-CM

## 2012-10-24 DIAGNOSIS — E7800 Pure hypercholesterolemia, unspecified: Secondary | ICD-10-CM

## 2012-10-24 DIAGNOSIS — K219 Gastro-esophageal reflux disease without esophagitis: Secondary | ICD-10-CM

## 2012-10-24 DIAGNOSIS — R5381 Other malaise: Secondary | ICD-10-CM

## 2012-10-24 LAB — COMPREHENSIVE METABOLIC PANEL
ALT: 15 U/L (ref 0–35)
AST: 14 U/L (ref 0–37)
Albumin: 4.3 g/dL (ref 3.5–5.2)
BUN: 14 mg/dL (ref 6–23)
Calcium: 9.6 mg/dL (ref 8.4–10.5)
Chloride: 102 mEq/L (ref 96–112)
Creatinine, Ser: 0.9 mg/dL (ref 0.4–1.2)
Potassium: 4.6 mEq/L (ref 3.5–5.1)
Sodium: 139 mEq/L (ref 135–145)
Total Protein: 7.2 g/dL (ref 6.0–8.3)

## 2012-10-24 LAB — CBC WITH DIFFERENTIAL/PLATELET
Basophils Absolute: 0 10*3/uL (ref 0.0–0.1)
Eosinophils Absolute: 0.1 10*3/uL (ref 0.0–0.7)
Eosinophils Relative: 1.2 % (ref 0.0–5.0)
Lymphocytes Relative: 36.8 % (ref 12.0–46.0)
Lymphs Abs: 2.1 10*3/uL (ref 0.7–4.0)
MCHC: 33.9 g/dL (ref 30.0–36.0)
Monocytes Relative: 4.8 % (ref 3.0–12.0)
Platelets: 221 10*3/uL (ref 150.0–400.0)
RDW: 14.3 % (ref 11.5–14.6)
WBC: 5.7 10*3/uL (ref 4.5–10.5)

## 2012-10-24 LAB — LIPID PANEL
HDL: 44.6 mg/dL (ref >39.00)
Total CHOL/HDL Ratio: 5
Triglycerides: 90 mg/dL (ref 0.0–149.0)

## 2012-10-24 LAB — MICROALBUMIN / CREATININE URINE RATIO
Creatinine,U: 154.3 mg/dL
Microalb Creat Ratio: 0.2 mg/g (ref 0.0–30.0)
Microalb, Ur: 0.3 mg/dL (ref 0.0–1.9)

## 2012-10-24 LAB — HEMOGLOBIN A1C: Hgb A1c MFr Bld: 6.8 % — ABNORMAL HIGH (ref 4.6–6.5)

## 2012-10-24 LAB — LDL CHOLESTEROL, DIRECT: Direct LDL: 155.3 mg/dL

## 2012-10-24 LAB — TSH: TSH: 0.92 u[IU]/mL (ref 0.35–5.50)

## 2012-10-24 MED ORDER — VENLAFAXINE HCL ER 37.5 MG PO CP24: 37.5000 mg | ORAL_CAPSULE | Freq: Every day | ORAL | Status: DC

## 2012-10-24 NOTE — Telephone Encounter (Signed)
opened in error

## 2012-10-25 ENCOUNTER — Other Ambulatory Visit: Payer: Self-pay | Admitting: Internal Medicine

## 2012-10-25 ENCOUNTER — Encounter: Payer: Self-pay | Admitting: Internal Medicine

## 2012-10-25 MED ORDER — PRAVASTATIN SODIUM 10 MG PO TABS: 10.0000 mg | ORAL_TABLET | Freq: Every day | ORAL | Status: DC

## 2012-10-25 NOTE — Progress Notes (Signed)
rx sent in for pravastatin 10mg  q day.

## 2012-10-28 ENCOUNTER — Encounter: Payer: Self-pay | Admitting: Internal Medicine

## 2012-10-28 DIAGNOSIS — N951 Menopausal and female climacteric states: Secondary | ICD-10-CM | POA: Insufficient documentation

## 2012-10-28 NOTE — Assessment & Plan Note (Signed)
Noticed recently.  Resolved now.  Is scheduled for colonoscopy 11/13/12.

## 2012-10-28 NOTE — Assessment & Plan Note (Signed)
Blood pressure doing well.  Same medication regimen.  Follow.    

## 2012-10-28 NOTE — Assessment & Plan Note (Signed)
No reflux.  Doing well on current regimen.  Follow.

## 2012-10-28 NOTE — Assessment & Plan Note (Signed)
Low cholesterol diet and exercise.  Lipid panel 01/29/12 revealed total cholesterol 195, triglycerides 98, HDL 44 and LDL 130.  Off pravastatin.  Wants to try diet and exercise.  Follow.  Recheck lipid profile with next fasting labs.

## 2012-10-28 NOTE — Assessment & Plan Note (Signed)
Recent cbc wnl.  Follow.   

## 2012-10-28 NOTE — Assessment & Plan Note (Signed)
Diet controlled.  Low carb diet and exercise.  Follow met b and a1c.  Is due to see Dr Larence Penning for eye check.

## 2012-10-28 NOTE — Assessment & Plan Note (Signed)
Persistent hot flashes.  Affecting her sleep.  Discussed treatment options.  Will start effexor XR 37.5mg  q day.  Follow.

## 2012-10-28 NOTE — Progress Notes (Signed)
Subjective:    Patient ID: Courtney Donovan, female    DOB: 1960-02-21, 52 y.o.   MRN: [id number]  HPI 52 year old female with past history of hypertension, hypercholesterolemia, anemia and GERD who comes in today for a scheduled follow up.  She states she is doing better.  Feels better.  Off vivelle.  Feels it made her gaing weight.  She is still having hot flashes.  Affecting her sleep.  Discussed treatment options.  She prefers to avoid estrogen.   No chest pain or tightness.  Breathing stable.  No acid reflux.  Bowels stable.  Blood pressure per her report has been doing well - averaging 110-115/72-75.  Saw ENT for her neck.  Had CT scan.  Negative.  We discussed sleep study.  She prefers to do a study in home.  Had reported some increased daytime fatigue and not waking up rested.      Past Medical History  Diagnosis Date  . Hypertension   . Hypercholesterolemia   . Chronic headaches   . Anemia   . Diabetes mellitus     diet controlled  . Allergy    . Current Outpatient Prescriptions on File Prior to Visit  Medication Sig Dispense Refill  . aspirin 81 MG chewable tablet Chew 81 mg by mouth daily.      . fluticasone (FLONASE) 50 MCG/ACT nasal spray Place 2 sprays into the nose daily.  16 g  0  . LORazepam (ATIVAN) 0.5 MG tablet 1/2 tablet bid prn  20 tablet  0  . pantoprazole (PROTONIX) 40 MG tablet Take 1 tablet (40 mg total) by mouth daily.  15 tablet  0   No current facility-administered medications on file prior to visit.      Review of Systems Patient denies any lightheadedness or dizziness.  Headache in the am.  No sinus or allergy symptoms.  No chest pain, tightness or palpitations.  No increased shortness of breath, cough or congestion.  No nausea or vomiting.  No abdominal pain or cramping.  No bowel change, such as diarrhea, constipation, BRBPR or melana.  No urine change.  Off estrogen.  Hot flashes have returned.  Not sleeping well.  Headaches in the am.          Objective:   Physical Exam  Filed Vitals:   10/24/12 0839  BP: 110/80  Pulse: 78  Temp: 98.6 F (75 C)   52 year old female in no acute distress.   HEENT:  Nares - clear.  OP- without lesions or erythema.   NECK:  Supple, nontender.  No audible bruit.   HEART:  Appears to be regular. LUNGS:  Without crackles or wheezing audible.  Respirations even and unlabored.   RADIAL PULSE:  Equal bilaterally.  ABDOMEN:  Soft, nontender.  No audible abdominal bruit.   EXTREMITIES:  No increased edema to be present.                     Assessment & Plan:  CARDIOVASCULAR.  Asymptomatic.  Continue risk factor modification.      ANXIETY. Not sleeping well.  Discussed treatment options.  Start effexor.  Follow.       POSSIBLE SLEEP APNEA.  See previous visits.  Was scheduled for a split night sleep study.  They have contacted her. She has not scheduled.  She prefers to have the study in home.  See if can arrange.           HEALTH MAINTENANCE.  Pelvic and pap - through GYN.  Mammogram 01/29/12 - Birads I.  Was referred to GI for colon cancer screening - since due.  They performed an EGD.  Planning for colonoscopy 11/13/12.

## 2012-10-28 NOTE — Telephone Encounter (Signed)
Mailed unread message to pt  

## 2012-11-13 LAB — HM COLONOSCOPY

## 2012-11-14 ENCOUNTER — Other Ambulatory Visit: Payer: Self-pay

## 2012-12-02 ENCOUNTER — Ambulatory Visit (INDEPENDENT_AMBULATORY_CARE_PROVIDER_SITE_OTHER): Payer: BC Managed Care – PPO | Admitting: Internal Medicine

## 2012-12-02 ENCOUNTER — Telehealth: Payer: Self-pay | Admitting: Internal Medicine

## 2012-12-02 ENCOUNTER — Encounter: Payer: Self-pay | Admitting: Internal Medicine

## 2012-12-02 VITALS — BP 110/70 | HR 80 | Temp 97.9°F | Ht 60.0 in | Wt 180.2 lb

## 2012-12-02 DIAGNOSIS — K219 Gastro-esophageal reflux disease without esophagitis: Secondary | ICD-10-CM

## 2012-12-02 DIAGNOSIS — E119 Type 2 diabetes mellitus without complications: Secondary | ICD-10-CM

## 2012-12-02 DIAGNOSIS — N951 Menopausal and female climacteric states: Secondary | ICD-10-CM

## 2012-12-02 DIAGNOSIS — D649 Anemia, unspecified: Secondary | ICD-10-CM

## 2012-12-02 DIAGNOSIS — R5383 Other fatigue: Secondary | ICD-10-CM

## 2012-12-02 DIAGNOSIS — R0982 Postnasal drip: Secondary | ICD-10-CM

## 2012-12-02 DIAGNOSIS — E7800 Pure hypercholesterolemia, unspecified: Secondary | ICD-10-CM

## 2012-12-02 DIAGNOSIS — I10 Essential (primary) hypertension: Secondary | ICD-10-CM

## 2012-12-02 DIAGNOSIS — K625 Hemorrhage of anus and rectum: Secondary | ICD-10-CM

## 2012-12-02 DIAGNOSIS — R5381 Other malaise: Secondary | ICD-10-CM

## 2012-12-02 LAB — HM DIABETES FOOT EXAM

## 2012-12-02 LAB — HM DIABETES EYE EXAM

## 2012-12-02 MED ORDER — ATORVASTATIN CALCIUM 10 MG PO TABS: ORAL_TABLET | ORAL | Status: DC

## 2012-12-02 NOTE — Telephone Encounter (Signed)
Left patient a message on her voicemail with that information

## 2012-12-02 NOTE — Assessment & Plan Note (Addendum)
Blood pressure doing well.  Off medication.  Follow.    

## 2012-12-02 NOTE — Assessment & Plan Note (Addendum)
Will hold on further medication at this time.

## 2012-12-02 NOTE — Progress Notes (Signed)
Pre-visit discussion using our clinic review tool. No additional management support is needed unless otherwise documented below in the visit note.  

## 2012-12-02 NOTE — Progress Notes (Signed)
Subjective:    Patient ID: Courtney Donovan, female    DOB: [birth year], 52 y.o.   MRN: [id number]  HPI 52 year old female with past history of hypertension, hypercholesterolemia, anemia and GERD who comes in today for a scheduled follow up.  Off vivelle.  Feels it made her gain weight.  She prefers to avoid estrogen.   No chest pain or tightness.  Breathing stable.  No acid reflux.  Bowels stable.  Blood pressure per her report has been doing well.  Saw ENT for her neck.  Had CT scan.  Negative.  They are following.  They changed her protonix to 40mg  prilosec and instructed her to take zantac in the evening.  She was just evaluated by them today.  Planning for allergy testing.  Has some drainage.   We discussed sleep study.  She was previously unable to afford.  She states she is ready now to proceed with a sleep study.  Has increased daytime fatigue and reports not feeling rested when she awakens.  Did not tolerate the pravastatin.  LDL recently checked 155.  Discussed starting a new cholesterol medication.      Past Medical History  Diagnosis Date  . Hypertension   . Hypercholesterolemia   . Chronic headaches   . Anemia   . Diabetes mellitus     diet controlled  . Allergy    . Current Outpatient Prescriptions on File Prior to Visit  Medication Sig Dispense Refill  . aspirin 81 MG chewable tablet Chew 81 mg by mouth daily.      . fluticasone (FLONASE) 50 MCG/ACT nasal spray Place 2 sprays into the nose daily.  16 g  0  . LORazepam (ATIVAN) 0.5 MG tablet 1/2 tablet bid prn  20 tablet  0   No current facility-administered medications on file prior to visit.      Review of Systems Patient denies any lightheadedness or dizziness.   Some drainage.  See above.  No chest pain, tightness or palpitations.  No increased shortness of breath, cough or congestion.  No nausea or vomiting.  Planning to change her protonix as outlined.  No abdominal pain or cramping.  No bowel change, such as  diarrhea, constipation, BRBPR or melana.  No urine change.  Off estrogen.  Increased fatigue and daytime somnolence as outlined.  Persistent problem.          Objective:   Physical Exam  Filed Vitals:   12/02/12 1117  BP: 110/70  Pulse: 80  Temp: 97.9 F (36.6 C)   Blood pressure recheck:  126/74, pulse 3  52 year old female in no acute distress.   HEENT:  Nares - clear.  OP- without lesions or erythema.   NECK:  Supple, nontender.  No audible bruit.   HEART:  Appears to be regular. LUNGS:  Without crackles or wheezing audible.  Respirations even and unlabored.   RADIAL PULSE:  Equal bilaterally.  ABDOMEN:  Soft, nontender.  No audible abdominal bruit.   EXTREMITIES:  No increased edema to be present.  FEET:  No lesions.                     Assessment & Plan:  CARDIOVASCULAR.  Asymptomatic.  Continue risk factor modification.      POSSIBLE SLEEP APNEA.  See previous visits.  Was scheduled for a split night sleep study.  She previously wanted to hold off secondary to financial issues.  She is ready to proceed.  With  the increased fatigue, daytime somnolence, not waking up rested and her elevated sugars - I do feel need to perform split night sleep study.            HEALTH MAINTENANCE.  Pelvic and pap - through GYN.  Mammogram 01/29/12 - Birads I.   Colonoscopy 11/13/12 - internal hemorrhoids.

## 2012-12-02 NOTE — Assessment & Plan Note (Addendum)
Recent cbc wnl.  Follow.   

## 2012-12-02 NOTE — Assessment & Plan Note (Addendum)
Low cholesterol diet and exercise.  Lipid panel 10/24/12 revealed total cholesterol 214, triglycerides 90, HDL 45 and LDL 155.   Discussed at length with her today.  She agreed to start lipitor 10mg  1/2 tab q Monday, Wednesday and Friday.  Check liver panel in 6 months.

## 2012-12-02 NOTE — Assessment & Plan Note (Addendum)
Diet controlled.  Low carb diet and exercise.  Follow met b and a1c.  A1c just checked 6.8.  Sees Dr Larence Penning.

## 2012-12-02 NOTE — Telephone Encounter (Signed)
Can the patient take one before bed LORazepam (ATIVAN) 0.5 MG tablet.

## 2012-12-02 NOTE — Telephone Encounter (Signed)
No -  hold on taking a full tablet of lorazepam until we get the information from the sleep study.

## 2012-12-02 NOTE — Telephone Encounter (Signed)
Please advise 

## 2012-12-03 ENCOUNTER — Encounter: Payer: Self-pay | Admitting: Internal Medicine

## 2012-12-03 NOTE — Assessment & Plan Note (Signed)
Planning for allergy testing.  Seeing ENT.

## 2012-12-03 NOTE — Assessment & Plan Note (Signed)
Saw ENT today.  They are changing her protonix to prilosec 40mg  q day.  Will also start zantac in the evening.  Follow.

## 2012-12-03 NOTE — Assessment & Plan Note (Signed)
Colonoscopy 11/13/12 - internal hemorrhoids.  No problems reported today.    

## 2012-12-04 ENCOUNTER — Ambulatory Visit: Payer: BC Managed Care – PPO

## 2013-01-08 ENCOUNTER — Other Ambulatory Visit: Payer: Self-pay | Admitting: *Deleted

## 2013-01-14 ENCOUNTER — Encounter: Payer: Self-pay | Admitting: *Deleted

## 2013-01-15 ENCOUNTER — Telehealth: Payer: Self-pay | Admitting: Internal Medicine

## 2013-01-15 NOTE — Telephone Encounter (Signed)
Error made appointment with raquel 1/8

## 2013-01-16 ENCOUNTER — Ambulatory Visit: Payer: BC Managed Care – PPO | Admitting: Adult Health

## 2013-01-16 ENCOUNTER — Other Ambulatory Visit (INDEPENDENT_AMBULATORY_CARE_PROVIDER_SITE_OTHER): Payer: BC Managed Care – PPO

## 2013-01-16 ENCOUNTER — Encounter: Payer: Self-pay | Admitting: Adult Health

## 2013-01-16 ENCOUNTER — Ambulatory Visit (INDEPENDENT_AMBULATORY_CARE_PROVIDER_SITE_OTHER): Payer: BC Managed Care – PPO | Admitting: Adult Health

## 2013-01-16 VITALS — BP 118/72 | HR 79 | Temp 98.1°F | Resp 12 | Ht 60.0 in | Wt 178.5 lb

## 2013-01-16 DIAGNOSIS — E7800 Pure hypercholesterolemia, unspecified: Secondary | ICD-10-CM

## 2013-01-16 DIAGNOSIS — E119 Type 2 diabetes mellitus without complications: Secondary | ICD-10-CM

## 2013-01-16 DIAGNOSIS — R519 Headache, unspecified: Secondary | ICD-10-CM | POA: Insufficient documentation

## 2013-01-16 DIAGNOSIS — R6889 Other general symptoms and signs: Secondary | ICD-10-CM

## 2013-01-16 DIAGNOSIS — R51 Headache: Secondary | ICD-10-CM

## 2013-01-16 DIAGNOSIS — R899 Unspecified abnormal finding in specimens from other organs, systems and tissues: Secondary | ICD-10-CM

## 2013-01-16 DIAGNOSIS — G43909 Migraine, unspecified, not intractable, without status migrainosus: Secondary | ICD-10-CM

## 2013-01-16 LAB — BASIC METABOLIC PANEL
BUN: 13 mg/dL (ref 6–23)
CALCIUM: 9.3 mg/dL (ref 8.4–10.5)
CO2: 29 mEq/L (ref 19–32)
Chloride: 105 mEq/L (ref 96–112)
Creatinine, Ser: 0.9 mg/dL (ref 0.4–1.2)
GFR: 87.28 mL/min (ref >60.00)
Glucose, Bld: 104 mg/dL — ABNORMAL HIGH (ref 70–99)
Potassium: 4.2 mEq/L (ref 3.5–5.1)
SODIUM: 140 meq/L (ref 135–145)

## 2013-01-16 LAB — HEPATIC FUNCTION PANEL
ALT: 14 U/L (ref 0–35)
AST: 15 U/L (ref 0–37)
Albumin: 4.4 g/dL (ref 3.5–5.2)
Alkaline Phosphatase: 66 U/L (ref 39–117)
BILIRUBIN TOTAL: 0.6 mg/dL (ref 0.3–1.2)
Bilirubin, Direct: 0 mg/dL (ref 0.0–0.3)
Total Protein: 7.6 g/dL (ref 6.0–8.3)

## 2013-01-16 LAB — LIPID PANEL
CHOL/HDL RATIO: 6
Cholesterol: 228 mg/dL — ABNORMAL HIGH (ref 0–200)
HDL: 38.2 mg/dL — ABNORMAL LOW (ref >39.00)
TRIGLYCERIDES: 116 mg/dL (ref 0.0–149.0)
VLDL: 23.2 mg/dL (ref 0.0–40.0)

## 2013-01-16 LAB — HEMOGLOBIN A1C: Hgb A1c MFr Bld: 6.4 % (ref 4.6–6.5)

## 2013-01-16 LAB — LDL CHOLESTEROL, DIRECT: Direct LDL: 159.5 mg/dL

## 2013-01-16 NOTE — Progress Notes (Signed)
Pre visit review using our clinic review tool, if applicable. No additional management support is needed unless otherwise documented below in the visit note. 

## 2013-01-16 NOTE — Telephone Encounter (Signed)
Spoke with pt while in office, her insurance declined the in home sleep study due to pt's BMI. Pt still likes to move forward with it, but will need to do it at a center rather than at home. She states she will contact [employer] to set up.

## 2013-01-16 NOTE — Progress Notes (Signed)
   Subjective:    Patient ID: Courtney Donovan, female    DOB: 1960-06-21, 53 y.o.   MRN: [id number]  HPI  Patient is a pleasant 53 year old female who presents to clinic with complaints of headache x1 week. She was seen at [facility] clinic approximately one week ago. She was given an intramuscular injection; however, she does not remember what the medication was. She reports that her headache is still ongoing. Patient with history of migraines and sinus problems. Her PCP has also recommended a sleep study to evaluate for sleep apnea since patient reports waking up with headaches most mornings. Patient has gone back-and-forth between scheduling the sleep study. She reports that she is going to scheduled a sleep study soon. She is also reporting that her vision is not as sharp as it has been in the past. She has an appointment to see the optometrist on 02/03/2013. Patient denies any neurological symptoms. She denies numbness, tingling, facial droop or paralysis. Patient denies altered speech. Patient denies was with her migraines. She does not recall ever having an MRI or CT of her head. She reports that [facility] clinic urgent care has referred her to a neurologist.   Current Outpatient Prescriptions on File Prior to Visit  Medication Sig Dispense Refill  . aspirin 81 MG chewable tablet Chew 81 mg by mouth daily.      Marland Kitchen atorvastatin (LIPITOR) 10 MG tablet 1/2 tablet q day  30 tablet  1  . fluticasone (FLONASE) 50 MCG/ACT nasal spray Place 2 sprays into the nose daily.  16 g  0  . omeprazole (PRILOSEC) 40 MG capsule Take 40 mg by mouth daily.      Marland Kitchen LORazepam (ATIVAN) 0.5 MG tablet 1/2 tablet bid prn  20 tablet  0   No current facility-administered medications on file prior to visit.      Review of Systems  Constitutional: Negative.   HENT: Positive for postnasal drip and sinus pressure.   Respiratory: Negative.   Cardiovascular: Negative.   Neurological: Positive for headaches.  Negative for dizziness, tremors, syncope, facial asymmetry, speech difficulty, weakness and light-headedness.  All other systems reviewed and are negative.       Objective:   Physical Exam  Constitutional: She is oriented to person, place, and time.  Pleasant 53 year old female in no distress.  HENT:  Head: Normocephalic and atraumatic.  Right Ear: External ear normal.  Left Ear: External ear normal.  Nose: Nose normal.  Mouth/Throat: Oropharynx is clear and moist. No oropharyngeal exudate.  Tenderness upon palpating TMJ on the left. Patient denies grinding teeth.  Eyes: Conjunctivae and EOM are normal. Pupils are equal, round, and reactive to light.  Neck: Normal range of motion. Neck supple. No tracheal deviation present.  Cardiovascular: Normal rate and regular rhythm.   Pulmonary/Chest: Effort normal. No respiratory distress.  Musculoskeletal: Normal range of motion.  Lymphadenopathy:    She has no cervical adenopathy.  Neurological: She is alert and oriented to person, place, and time. No cranial nerve deficit. Coordination normal.  Skin: Skin is warm and dry.  Psychiatric: She has a normal mood and affect. Her behavior is normal. Judgment and thought content normal.          Assessment & Plan:

## 2013-01-16 NOTE — Assessment & Plan Note (Signed)
Ongoing x1 week. She was seen at [facility] clinic urgent care and prescribed medication for nausea as well as intramuscular injection. I suspect Toradol. However patient does not remember the medication she was administered. She is currently taking Tylenol to help relieve headache. Recommend that she schedule sleep study to evaluate for sleep apnea. I appointment already scheduled on 02/03/2013. MRI - evaluate for AV malformations prior to initiating migraine specific medication.

## 2013-01-18 ENCOUNTER — Ambulatory Visit: Payer: Self-pay | Admitting: Adult Health

## 2013-01-19 ENCOUNTER — Encounter: Payer: Self-pay | Admitting: Internal Medicine

## 2013-01-20 ENCOUNTER — Other Ambulatory Visit: Payer: BC Managed Care – PPO

## 2013-01-20 ENCOUNTER — Other Ambulatory Visit: Payer: Self-pay | Admitting: Adult Health

## 2013-01-20 DIAGNOSIS — R9389 Abnormal findings on diagnostic imaging of other specified body structures: Secondary | ICD-10-CM

## 2013-01-20 NOTE — Progress Notes (Signed)
Left message for pt to return my call.

## 2013-01-20 NOTE — Progress Notes (Signed)
Mildly abnormal MRI brain 01/18/13. Referring to Dr. Brigitte Pulse at [facility]

## 2013-01-21 NOTE — Progress Notes (Signed)
Left message for pt to return my call.

## 2013-01-23 NOTE — Progress Notes (Signed)
Left message, notifying of results and appointment, Dr. Lanelle Bal Appt 02/06/13 @8 :9, arrive 8:30, #[phone].

## 2013-01-23 NOTE — Progress Notes (Signed)
Left message for pt to return my call.

## 2013-01-29 ENCOUNTER — Ambulatory Visit: Payer: Self-pay | Admitting: Internal Medicine

## 2013-01-29 LAB — HM MAMMOGRAPHY: HM Mammogram: NEGATIVE

## 2013-01-30 ENCOUNTER — Encounter: Payer: Self-pay | Admitting: Internal Medicine

## 2013-02-05 ENCOUNTER — Encounter: Payer: Self-pay | Admitting: Adult Health

## 2013-02-07 ENCOUNTER — Other Ambulatory Visit: Payer: Self-pay | Admitting: *Deleted

## 2013-02-07 MED ORDER — FLUTICASONE PROPIONATE 50 MCG/ACT NA SUSP: 2.0000 | Freq: Every day | NASAL | Status: DC

## 2013-02-15 ENCOUNTER — Ambulatory Visit: Payer: Self-pay | Admitting: Neurology

## 2013-03-07 ENCOUNTER — Ambulatory Visit: Payer: Self-pay | Admitting: Neurology

## 2013-04-02 ENCOUNTER — Ambulatory Visit (INDEPENDENT_AMBULATORY_CARE_PROVIDER_SITE_OTHER): Payer: BC Managed Care – PPO | Admitting: Internal Medicine

## 2013-04-02 ENCOUNTER — Encounter: Payer: Self-pay | Admitting: Internal Medicine

## 2013-04-02 VITALS — BP 130/80 | HR 76 | Temp 98.5°F | Ht 60.0 in | Wt 175.5 lb

## 2013-04-02 DIAGNOSIS — R51 Headache: Secondary | ICD-10-CM

## 2013-04-02 DIAGNOSIS — R21 Rash and other nonspecific skin eruption: Secondary | ICD-10-CM

## 2013-04-02 DIAGNOSIS — E119 Type 2 diabetes mellitus without complications: Secondary | ICD-10-CM

## 2013-04-02 DIAGNOSIS — K219 Gastro-esophageal reflux disease without esophagitis: Secondary | ICD-10-CM

## 2013-04-02 DIAGNOSIS — D649 Anemia, unspecified: Secondary | ICD-10-CM

## 2013-04-02 DIAGNOSIS — E7800 Pure hypercholesterolemia, unspecified: Secondary | ICD-10-CM

## 2013-04-02 DIAGNOSIS — E559 Vitamin D deficiency, unspecified: Secondary | ICD-10-CM

## 2013-04-02 DIAGNOSIS — N951 Menopausal and female climacteric states: Secondary | ICD-10-CM

## 2013-04-02 DIAGNOSIS — I10 Essential (primary) hypertension: Secondary | ICD-10-CM

## 2013-04-02 LAB — LIPID PANEL
Cholesterol: 174 mg/dL (ref 0–200)
HDL: 41.3 mg/dL (ref >39.00)
LDL Cholesterol: 113 mg/dL — ABNORMAL HIGH (ref 0–99)
Total CHOL/HDL Ratio: 4
Triglycerides: 98 mg/dL (ref 0.0–149.0)
VLDL: 19.6 mg/dL (ref 0.0–40.0)

## 2013-04-02 LAB — HEPATIC FUNCTION PANEL
ALT: 14 U/L (ref 0–35)
AST: 15 U/L (ref 0–37)
Albumin: 4.1 g/dL (ref 3.5–5.2)
Alkaline Phosphatase: 78 U/L (ref 39–117)
BILIRUBIN DIRECT: 0.1 mg/dL (ref 0.0–0.3)
BILIRUBIN TOTAL: 0.5 mg/dL (ref 0.3–1.2)
Total Protein: 6.6 g/dL (ref 6.0–8.3)

## 2013-04-02 LAB — BASIC METABOLIC PANEL
BUN: 14 mg/dL (ref 6–23)
CALCIUM: 9.4 mg/dL (ref 8.4–10.5)
CO2: 28 meq/L (ref 19–32)
Chloride: 105 mEq/L (ref 96–112)
Creatinine, Ser: 0.9 mg/dL (ref 0.4–1.2)
GFR: 90.77 mL/min (ref >60.00)
GLUCOSE: 106 mg/dL — AB (ref 70–99)
Potassium: 4.1 mEq/L (ref 3.5–5.1)
Sodium: 139 mEq/L (ref 135–145)

## 2013-04-02 LAB — HEMOGLOBIN A1C: Hgb A1c MFr Bld: 6.6 % — ABNORMAL HIGH (ref 4.6–6.5)

## 2013-04-02 NOTE — Progress Notes (Signed)
Pre-visit discussion using our clinic review tool. No additional management support is needed unless otherwise documented below in the visit note.  

## 2013-04-03 ENCOUNTER — Encounter: Payer: Self-pay | Admitting: Internal Medicine

## 2013-04-05 ENCOUNTER — Encounter: Payer: Self-pay | Admitting: Internal Medicine

## 2013-04-05 DIAGNOSIS — R21 Rash and other nonspecific skin eruption: Secondary | ICD-10-CM | POA: Insufficient documentation

## 2013-04-05 DIAGNOSIS — E559 Vitamin D deficiency, unspecified: Secondary | ICD-10-CM | POA: Insufficient documentation

## 2013-04-05 NOTE — Assessment & Plan Note (Signed)
Refer to dermatology for evaluation. 

## 2013-04-05 NOTE — Assessment & Plan Note (Signed)
No problems with acid reflux reported today.  Follow.   

## 2013-04-05 NOTE — Assessment & Plan Note (Signed)
Headaches better.  Seeing neurology.  Not taking topamax or flexeril.  Follow.  Continues f/u with neurology.   

## 2013-04-05 NOTE — Progress Notes (Signed)
Subjective:    Patient ID: Courtney Donovan, female    DOB: 07/03/1960, 53 y.o.   MRN: [id number]  HPI 53 year old female with past history of hypertension, hypercholesterolemia, anemia and GERD who comes in today for a scheduled follow up.  Off vivelle.  Feels it made her gain weight.  She prefers to avoid estrogen.   No chest pain or tightness.  Breathing stable.  No acid reflux.  Bowels stable.  Blood pressure per her report has been doing well.  Saw ENT for her neck.  Had CT scan.  Negative.  They are following.  They changed her protonix to 40mg  prilosec and instructed her to take zantac in the evening.  Did not tolerate the pravastatin.  Now on lipitor and tolerating.  Had her sleep study.  Negative for sleep apnea.  Say gyn - Dr Cristino Martes.  Started on vitamin D supplementation.  Has f/u soon to recheck her vitamin D level.  Has lunesta if needed to help her sleep.  Not taking.  Headaches are better.  Has topamax and flexeril.  Not taking.  Is following up with neurology.  Seeing Dr Matilde Sprang for her eye exams.      Past Medical History  Diagnosis Date  . Hypertension   . Hypercholesterolemia   . Chronic headaches   . Anemia   . Diabetes mellitus     diet controlled  . Allergy    . Current Outpatient Prescriptions on File Prior to Visit  Medication Sig Dispense Refill  . aspirin 81 MG chewable tablet Chew 81 mg by mouth daily.      Marland Kitchen atorvastatin (LIPITOR) 10 MG tablet 1/2 tablet q day  30 tablet  1  . fluticasone (FLONASE) 50 MCG/ACT nasal spray Place 2 sprays into both nostrils daily.  48 g  3  . LORazepam (ATIVAN) 0.5 MG tablet 1/2 tablet bid prn  20 tablet  0  . Multiple Vitamin (MULTIVITAMIN) tablet Take 1 tablet by mouth daily.      Marland Kitchen omeprazole (PRILOSEC) 40 MG capsule Take 40 mg by mouth daily.       No current facility-administered medications on file prior to visit.      Review of Systems Patient denies any lightheadedness or dizziness.   Headaches better.  No chest  pain, tightness or palpitations.  No increased shortness of breath, cough or congestion.  No nausea or vomiting.  No problems with acid reflux reported today.  No abdominal pain or cramping.  No bowel change, such as diarrhea, constipation, BRBPR or melana.  No urine change.  Off estrogen.  Tolerating lipitor.           Objective:   Physical Exam  Filed Vitals:   04/02/13 0809  BP: 130/80  Pulse: 76  Temp: 98.5 F (36.9 C)   Blood pressure recheck:  68/66  53 year old female in no acute distress.   HEENT:  Nares - clear.  OP- without lesions or erythema.   NECK:  Supple, nontender.  No audible bruit.   HEART:  Appears to be regular. LUNGS:  Without crackles or wheezing audible.  Respirations even and unlabored.   RADIAL PULSE:  Equal bilaterally.  ABDOMEN:  Soft, nontender.  No audible abdominal bruit.   EXTREMITIES:  No increased edema to be present.  FEET:  No lesions.   SKIN:  Rash - posterior neck and extends aound her neck.  Assessment & Plan:  CARDIOVASCULAR.  Asymptomatic.  Continue risk factor modification.      HEALTH MAINTENANCE.  Pelvic and pap - through GYN.  Mammogram 01/29/13 - Birads I.   Colonoscopy 11/13/12 - internal hemorrhoids.

## 2013-04-05 NOTE — Assessment & Plan Note (Signed)
On replacement.  Followed by Dr Klett.    

## 2013-04-05 NOTE — Assessment & Plan Note (Signed)
Low cholesterol diet and exercise.  Tolerating lipitor.  Follow.

## 2013-04-05 NOTE — Assessment & Plan Note (Signed)
Blood pressure doing well.  Off medication.  Follow.    

## 2013-04-05 NOTE — Assessment & Plan Note (Signed)
Diet controlled.  Low carb diet and exercise.  Follow met b and a1c.  Sees Dr Matilde Sprang.

## 2013-04-05 NOTE — Assessment & Plan Note (Signed)
Last cbc wnl.  Follow.   

## 2013-04-05 NOTE — Assessment & Plan Note (Signed)
Will hold on further medication at this time.  Appears to be doing well.  Follow.

## 2013-04-20 ENCOUNTER — Telehealth: Payer: Self-pay | Admitting: Internal Medicine

## 2013-04-20 NOTE — Telephone Encounter (Signed)
Pt notified sleep study normal.

## 2013-04-22 NOTE — Telephone Encounter (Signed)
Mailed unread message to pt  

## 2013-07-25 ENCOUNTER — Other Ambulatory Visit: Payer: Self-pay | Admitting: *Deleted

## 2013-07-25 NOTE — Telephone Encounter (Signed)
Ok refill? Last visit 04/02/13

## 2013-07-27 NOTE — Telephone Encounter (Signed)
Ok to refill x1.  She needs a f/u appt with me.  Please get Lenna Sciara to schedule a f/u appt (70min) - first available.  Thanks.

## 2013-07-28 MED ORDER — LORAZEPAM 0.5 MG PO TABS: ORAL_TABLET | ORAL | Status: DC

## 2013-07-28 NOTE — Telephone Encounter (Signed)
Rx printed and faxed to pharmacy.  Melissa- see below to call and schedule appt. Thanks!

## 2013-09-03 ENCOUNTER — Telehealth: Payer: Self-pay | Admitting: Internal Medicine

## 2013-09-03 NOTE — Telephone Encounter (Signed)
Pt is having headaches and request to be seen sooner. Please advise.msn

## 2013-09-04 NOTE — Telephone Encounter (Signed)
I don't mind seeing her but she was referred to neurology from here because of persistent headache.  If having increased problems, I would recommend a f/u with neurology.  May save her two visit.  If agreeable, let me know and we will get her an appt.  Thanks.

## 2013-09-04 NOTE — Telephone Encounter (Signed)
Please advise 

## 2013-09-04 NOTE — Telephone Encounter (Signed)
If Dr. Nicki Reaper has nothing, please see if Raquel has anything first. Pt can also keep appt with Dr. Nicki Reaper in September

## 2013-09-04 NOTE — Telephone Encounter (Signed)
Pt given option to schedule with Raqual. Pt only wants appt with Dr. Nicki Reaper

## 2013-09-04 NOTE — Telephone Encounter (Signed)
Pt notified & states that she will contact Dr. Manuella Ghazi office herself & keep her appt here on the 24th

## 2013-10-02 ENCOUNTER — Ambulatory Visit (INDEPENDENT_AMBULATORY_CARE_PROVIDER_SITE_OTHER): Payer: BC Managed Care – PPO | Admitting: Internal Medicine

## 2013-10-02 ENCOUNTER — Other Ambulatory Visit: Payer: Self-pay | Admitting: Internal Medicine

## 2013-10-02 ENCOUNTER — Encounter: Payer: Self-pay | Admitting: Internal Medicine

## 2013-10-02 VITALS — BP 120/80 | HR 83 | Temp 98.5°F | Ht 60.0 in | Wt 177.5 lb

## 2013-10-02 DIAGNOSIS — E7800 Pure hypercholesterolemia, unspecified: Secondary | ICD-10-CM

## 2013-10-02 DIAGNOSIS — R5383 Other fatigue: Secondary | ICD-10-CM

## 2013-10-02 DIAGNOSIS — E119 Type 2 diabetes mellitus without complications: Secondary | ICD-10-CM

## 2013-10-02 DIAGNOSIS — N951 Menopausal and female climacteric states: Secondary | ICD-10-CM

## 2013-10-02 DIAGNOSIS — I10 Essential (primary) hypertension: Secondary | ICD-10-CM

## 2013-10-02 DIAGNOSIS — K219 Gastro-esophageal reflux disease without esophagitis: Secondary | ICD-10-CM

## 2013-10-02 DIAGNOSIS — K625 Hemorrhage of anus and rectum: Secondary | ICD-10-CM

## 2013-10-02 DIAGNOSIS — D649 Anemia, unspecified: Secondary | ICD-10-CM

## 2013-10-02 DIAGNOSIS — R51 Headache: Secondary | ICD-10-CM

## 2013-10-02 DIAGNOSIS — E559 Vitamin D deficiency, unspecified: Secondary | ICD-10-CM

## 2013-10-02 DIAGNOSIS — R5381 Other malaise: Secondary | ICD-10-CM

## 2013-10-02 LAB — CBC WITH DIFFERENTIAL/PLATELET
BASOS ABS: 0 10*3/uL (ref 0.0–0.1)
BASOS PCT: 0.2 % (ref 0.0–3.0)
EOS ABS: 0.1 10*3/uL (ref 0.0–0.7)
Eosinophils Relative: 1 % (ref 0.0–5.0)
HCT: 38.1 % (ref 36.0–46.0)
Hemoglobin: 12.6 g/dL (ref 12.0–15.0)
LYMPHS PCT: 33.2 % (ref 12.0–46.0)
Lymphs Abs: 2.2 10*3/uL (ref 0.7–4.0)
MCHC: 33 g/dL (ref 30.0–36.0)
MCV: 87.7 fl (ref 78.0–100.0)
Monocytes Absolute: 0.4 10*3/uL (ref 0.1–1.0)
Monocytes Relative: 6.5 % (ref 3.0–12.0)
Neutro Abs: 3.8 10*3/uL (ref 1.4–7.7)
Neutrophils Relative %: 59.1 % (ref 43.0–77.0)
Platelets: 230 10*3/uL (ref 150.0–400.0)
RBC: 4.34 Mil/uL (ref 3.87–5.11)
RDW: 14 % (ref 11.5–15.5)
WBC: 6.5 10*3/uL (ref 4.0–10.5)

## 2013-10-02 LAB — HEPATIC FUNCTION PANEL
ALBUMIN: 4.2 g/dL (ref 3.5–5.2)
ALK PHOS: 76 U/L (ref 39–117)
ALT: 15 U/L (ref 0–35)
AST: 14 U/L (ref 0–37)
Bilirubin, Direct: 0.1 mg/dL (ref 0.0–0.3)
TOTAL PROTEIN: 7.2 g/dL (ref 6.0–8.3)
Total Bilirubin: 0.6 mg/dL (ref 0.2–1.2)

## 2013-10-02 LAB — VITAMIN B12: Vitamin B-12: 362 pg/mL (ref 211–911)

## 2013-10-02 LAB — BASIC METABOLIC PANEL
BUN: 15 mg/dL (ref 6–23)
CALCIUM: 9.4 mg/dL (ref 8.4–10.5)
CO2: 30 mEq/L (ref 19–32)
CREATININE: 0.7 mg/dL (ref 0.4–1.2)
Chloride: 103 mEq/L (ref 96–112)
GFR: 107.98 mL/min (ref >60.00)
Glucose, Bld: 113 mg/dL — ABNORMAL HIGH (ref 70–99)
Potassium: 4.3 mEq/L (ref 3.5–5.1)
Sodium: 138 mEq/L (ref 135–145)

## 2013-10-02 LAB — LIPID PANEL
Cholesterol: 242 mg/dL — ABNORMAL HIGH (ref 0–200)
HDL: 44.6 mg/dL (ref >39.00)
LDL Cholesterol: 169 mg/dL — ABNORMAL HIGH (ref 0–99)
NonHDL: 197.4
Total CHOL/HDL Ratio: 5
Triglycerides: 140 mg/dL (ref 0.0–149.0)
VLDL: 28 mg/dL (ref 0.0–40.0)

## 2013-10-02 LAB — HEMOGLOBIN A1C: HEMOGLOBIN A1C: 6.5 % (ref 4.6–6.5)

## 2013-10-02 LAB — TSH: TSH: 1.29 u[IU]/mL (ref 0.35–4.50)

## 2013-10-02 LAB — FERRITIN: Ferritin: 97.9 ng/mL (ref 10.0–291.0)

## 2013-10-02 LAB — IBC PANEL
Iron: 69 ug/dL (ref 42–145)
Saturation Ratios: 24 % (ref 20.0–50.0)
Transferrin: 205.1 mg/dL — ABNORMAL LOW (ref 212.0–360.0)

## 2013-10-02 MED ORDER — LORAZEPAM 0.5 MG PO TABS: ORAL_TABLET | ORAL | Status: DC

## 2013-10-02 NOTE — Assessment & Plan Note (Signed)
Blood pressure doing well.  Off medication.  Follow.

## 2013-10-02 NOTE — Assessment & Plan Note (Signed)
Low cholesterol diet and exercise.  Tolerated lipitor.  Off now.  Recheck lipid panel today.  Follow.

## 2013-10-02 NOTE — Assessment & Plan Note (Signed)
On replacement.  Followed by Dr Cristino Martes.

## 2013-10-02 NOTE — Assessment & Plan Note (Signed)
Symptoms as outlined.  Just saw Dr Cristino Martes.  Back on estrogen patch now.  Follow.

## 2013-10-02 NOTE — Progress Notes (Signed)
Order placed for f/u liver panel.  

## 2013-10-02 NOTE — Assessment & Plan Note (Signed)
No problems with acid reflux reported today.  Follow.

## 2013-10-02 NOTE — Assessment & Plan Note (Signed)
Colonoscopy 11/13/12 - internal hemorrhoids.  No problems reported today.

## 2013-10-02 NOTE — Progress Notes (Signed)
Pre visit review using our clinic review tool, if applicable. No additional management support is needed unless otherwise documented below in the visit note. 

## 2013-10-02 NOTE — Assessment & Plan Note (Signed)
Diet controlled.  Low carb diet and exercise.  Follow met b and a1c.  Sees Dr Nice.     

## 2013-10-02 NOTE — Progress Notes (Signed)
Subjective:    Patient ID: Courtney Donovan, female    DOB: January 17, 1960, 53 y.o.   MRN: [id number]  HPI 53 year old female with past history of hypertension, hypercholesterolemia, anemia and GERD who comes in today for a scheduled follow up.  She was off vivelle.  Felt it made her gain weight.  Recently started back on estrogen.  Wearing the patch.  Recently saw Dr Cristino Martes and she started her back on the estrogen.  She reports not sleeping as well and reports some mood swings.  Some fatigue associated.  No chest pain or tightness.  Breathing stable.  No acid reflux.  Bowels stable.  Saw ENT for her neck.  Had CT scan.  Negative.  They are following.   Was on lipitor and tolerating.  She has stopped taking the lipitor regularly.  Wanted to see how her cholesterol was doing with diet.  Had a sleep study.  Negative for sleep apnea.  Say gyn - Dr Cristino Martes.    Has lunesta if needed to help her sleep.  Prescribed by Dr Cristino Martes.   Not taking.  Headaches are better.  Seeing neurology.  Seeing Dr Matilde Sprang for her eye exams.      Past Medical History  Diagnosis Date  . Hypertension   . Hypercholesterolemia   . Chronic headaches   . Anemia   . Diabetes mellitus     diet controlled  . Allergy    . Current Outpatient Prescriptions on File Prior to Visit  Medication Sig Dispense Refill  . aspirin 81 MG chewable tablet Chew 81 mg by mouth daily.      Marland Kitchen atorvastatin (LIPITOR) 10 MG tablet 1/2 tablet q day  30 tablet  1  . fluticasone (FLONASE) 50 MCG/ACT nasal spray Place 2 sprays into both nostrils daily.  48 g  3  . LORazepam (ATIVAN) 0.5 MG tablet 1/2 tablet bid prn  20 tablet  0  . Multiple Vitamin (MULTIVITAMIN) tablet Take 1 tablet by mouth daily.      Marland Kitchen omeprazole (PRILOSEC) 40 MG capsule Take 40 mg by mouth daily.       No current facility-administered medications on file prior to visit.      Review of Systems Patient denies any lightheadedness or dizziness.   Headaches better.  No chest pain,  tightness or palpitations.  No increased shortness of breath, cough or congestion.  No nausea or vomiting.  No problems with acid reflux reported today.  No abdominal pain or cramping.  No bowel change, such as diarrhea, constipation, BRBPR or melana.  No urine change.  Just started back on estrogen.  Not sleeping.  Mood swings.            Objective:   Physical Exam  Filed Vitals:   10/02/13 0809  BP: 120/80  Pulse: 83  Temp: 98.5 F (36.9 C)   Blood pressure recheck:  47/31  53 year old female in no acute distress.   HEENT:  Nares - clear.  OP- without lesions or erythema.   NECK:  Supple, nontender.  No audible bruit.   HEART:  Appears to be regular. LUNGS:  Without crackles or wheezing audible.  Respirations even and unlabored.   RADIAL PULSE:  Equal bilaterally.  ABDOMEN:  Soft, nontender.  No audible abdominal bruit.   EXTREMITIES:  No increased edema to be present.  FEET:  No lesions.  Assessment & Plan:  CARDIOVASCULAR.  Asymptomatic.  Continue risk factor modification.      HEALTH MAINTENANCE.  Pelvic and pap - through GYN.  Mammogram 01/29/13 - Birads I.   Colonoscopy 11/13/12 - internal hemorrhoids.     I spent 25 minutes with the patient and more than 50% of the time was spent in consultation regarding the above.

## 2013-10-02 NOTE — Assessment & Plan Note (Signed)
Last cbc wnl.  Follow.  Check cbc, iron studies and B12 today.

## 2013-10-02 NOTE — Assessment & Plan Note (Signed)
Headaches better.  Seeing neurology.  Not taking topamax or flexeril.  Follow.  Continues f/u with neurology.

## 2013-10-03 ENCOUNTER — Encounter: Payer: Self-pay | Admitting: *Deleted

## 2013-10-10 ENCOUNTER — Telehealth: Payer: Self-pay | Admitting: Internal Medicine

## 2013-10-10 ENCOUNTER — Other Ambulatory Visit: Payer: Self-pay | Admitting: *Deleted

## 2013-10-10 MED ORDER — ATORVASTATIN CALCIUM 10 MG PO TABS: ORAL_TABLET | ORAL | Status: DC

## 2013-10-10 NOTE — Telephone Encounter (Signed)
Rx sent to pharmacy   

## 2013-10-10 NOTE — Telephone Encounter (Signed)
atorvastatin (LIPITOR) 10 MG tablet   

## 2013-11-10 ENCOUNTER — Other Ambulatory Visit (INDEPENDENT_AMBULATORY_CARE_PROVIDER_SITE_OTHER): Payer: BC Managed Care – PPO

## 2013-11-10 DIAGNOSIS — E7800 Pure hypercholesterolemia, unspecified: Secondary | ICD-10-CM

## 2013-11-10 LAB — HEPATIC FUNCTION PANEL
ALT: 14 U/L (ref 0–35)
AST: 15 U/L (ref 0–37)
Albumin: 3.7 g/dL (ref 3.5–5.2)
Alkaline Phosphatase: 73 U/L (ref 39–117)
BILIRUBIN DIRECT: 0.1 mg/dL (ref 0.0–0.3)
BILIRUBIN TOTAL: 0.5 mg/dL (ref 0.2–1.2)
Total Protein: 7.1 g/dL (ref 6.0–8.3)

## 2013-11-11 ENCOUNTER — Encounter: Payer: Self-pay | Admitting: Internal Medicine

## 2013-11-28 ENCOUNTER — Ambulatory Visit: Payer: BC Managed Care – PPO | Admitting: Internal Medicine

## 2013-12-18 ENCOUNTER — Other Ambulatory Visit: Payer: Self-pay | Admitting: Internal Medicine

## 2013-12-19 ENCOUNTER — Encounter: Payer: Self-pay | Admitting: Internal Medicine

## 2013-12-19 ENCOUNTER — Other Ambulatory Visit: Payer: Self-pay | Admitting: Internal Medicine

## 2013-12-19 MED ORDER — ATORVASTATIN CALCIUM 10 MG PO TABS: ORAL_TABLET | ORAL | Status: DC

## 2014-02-02 ENCOUNTER — Ambulatory Visit: Payer: Self-pay | Admitting: Internal Medicine

## 2014-02-02 LAB — HM MAMMOGRAPHY: HM Mammogram: NEGATIVE

## 2014-02-03 ENCOUNTER — Encounter: Payer: Self-pay | Admitting: *Deleted

## 2014-02-06 ENCOUNTER — Encounter: Payer: Self-pay | Admitting: Internal Medicine

## 2014-04-08 ENCOUNTER — Telehealth: Payer: Self-pay

## 2014-04-08 DIAGNOSIS — E7800 Pure hypercholesterolemia, unspecified: Secondary | ICD-10-CM

## 2014-04-08 DIAGNOSIS — E119 Type 2 diabetes mellitus without complications: Secondary | ICD-10-CM

## 2014-04-08 DIAGNOSIS — E559 Vitamin D deficiency, unspecified: Secondary | ICD-10-CM

## 2014-04-08 DIAGNOSIS — I10 Essential (primary) hypertension: Secondary | ICD-10-CM

## 2014-04-08 NOTE — Telephone Encounter (Signed)
The patient called and is hoping to have lab orders entered to check her cholesterol level.

## 2014-04-08 NOTE — Telephone Encounter (Signed)
Order placed for labs.  Please schedule her for fasting labs and also, she is overdue a f/u appt.  Please schedule 30 minute f/u appt with me.  Thanks.

## 2014-04-15 ENCOUNTER — Other Ambulatory Visit (INDEPENDENT_AMBULATORY_CARE_PROVIDER_SITE_OTHER): Payer: BLUE CROSS/BLUE SHIELD

## 2014-04-15 DIAGNOSIS — E7800 Pure hypercholesterolemia, unspecified: Secondary | ICD-10-CM

## 2014-04-15 DIAGNOSIS — E119 Type 2 diabetes mellitus without complications: Secondary | ICD-10-CM | POA: Diagnosis not present

## 2014-04-15 DIAGNOSIS — E559 Vitamin D deficiency, unspecified: Secondary | ICD-10-CM

## 2014-04-15 LAB — LIPID PANEL
CHOL/HDL RATIO: 5
CHOLESTEROL: 188 mg/dL (ref 0–200)
HDL: 40.2 mg/dL (ref >39.00)
LDL Cholesterol: 125 mg/dL — ABNORMAL HIGH (ref 0–99)
NonHDL: 147.8
Triglycerides: 114 mg/dL (ref 0.0–149.0)
VLDL: 22.8 mg/dL (ref 0.0–40.0)

## 2014-04-15 LAB — BASIC METABOLIC PANEL
BUN: 20 mg/dL (ref 6–23)
CHLORIDE: 104 meq/L (ref 96–112)
CO2: 29 meq/L (ref 19–32)
CREATININE: 0.89 mg/dL (ref 0.40–1.20)
Calcium: 9.6 mg/dL (ref 8.4–10.5)
GFR: 85.73 mL/min (ref >60.00)
Glucose, Bld: 110 mg/dL — ABNORMAL HIGH (ref 70–99)
Potassium: 4.2 mEq/L (ref 3.5–5.1)
Sodium: 137 mEq/L (ref 135–145)

## 2014-04-15 LAB — MICROALBUMIN / CREATININE URINE RATIO
Creatinine,U: 134.9 mg/dL
MICROALB/CREAT RATIO: 0.5 mg/g (ref 0.0–30.0)
Microalb, Ur: <0.7 mg/dL (ref 0.0–1.9)

## 2014-04-15 LAB — HEPATIC FUNCTION PANEL
ALBUMIN: 4.2 g/dL (ref 3.5–5.2)
ALT: 11 U/L (ref 0–35)
AST: 14 U/L (ref 0–37)
Alkaline Phosphatase: 68 U/L (ref 39–117)
Bilirubin, Direct: 0.1 mg/dL (ref 0.0–0.3)
Total Bilirubin: 0.3 mg/dL (ref 0.2–1.2)
Total Protein: 7.1 g/dL (ref 6.0–8.3)

## 2014-04-15 LAB — HEMOGLOBIN A1C: HEMOGLOBIN A1C: 6.2 % (ref 4.6–6.5)

## 2014-04-15 LAB — VITAMIN D 25 HYDROXY (VIT D DEFICIENCY, FRACTURES): VITD: 11.44 ng/mL — AB (ref 30.00–100.00)

## 2014-04-16 ENCOUNTER — Encounter: Payer: Self-pay | Admitting: Internal Medicine

## 2014-04-17 MED ORDER — ERGOCALCIFEROL 1.25 MG (50000 UT) PO CAPS: 50000.0000 [IU] | ORAL_CAPSULE | ORAL | Status: DC

## 2014-04-17 NOTE — Telephone Encounter (Signed)
Pt called states she  Received your email and responded back stating she is ok with the Vitamin D Rx.  However she requests the 1st month be sent to local pharmacy and all others sent to [facility].

## 2014-04-17 NOTE — Telephone Encounter (Signed)
rx sent in to local pharmacy for ergocalciferol (one month) and to mail order for three months.

## 2014-06-11 ENCOUNTER — Ambulatory Visit: Payer: Self-pay | Admitting: Internal Medicine

## 2014-08-03 ENCOUNTER — Encounter: Payer: Self-pay | Admitting: Nurse Practitioner

## 2014-08-03 ENCOUNTER — Ambulatory Visit (INDEPENDENT_AMBULATORY_CARE_PROVIDER_SITE_OTHER): Payer: BLUE CROSS/BLUE SHIELD | Admitting: Nurse Practitioner

## 2014-08-03 VITALS — BP 116/82 | HR 67 | Temp 98.2°F | Resp 16 | Ht 60.0 in | Wt 173.2 lb

## 2014-08-03 DIAGNOSIS — R197 Diarrhea, unspecified: Secondary | ICD-10-CM | POA: Diagnosis not present

## 2014-08-03 LAB — CBC WITH DIFFERENTIAL/PLATELET
Basophils Absolute: 0 10*3/uL (ref 0.0–0.1)
Basophils Relative: 0.5 % (ref 0.0–3.0)
EOS PCT: 1.7 % (ref 0.0–5.0)
Eosinophils Absolute: 0.1 10*3/uL (ref 0.0–0.7)
HCT: 36.3 % (ref 36.0–46.0)
HEMOGLOBIN: 12 g/dL (ref 12.0–15.0)
LYMPHS PCT: 36.8 % (ref 12.0–46.0)
Lymphs Abs: 1.9 10*3/uL (ref 0.7–4.0)
MCHC: 33 g/dL (ref 30.0–36.0)
MCV: 87.1 fl (ref 78.0–100.0)
MONO ABS: 0.3 10*3/uL (ref 0.1–1.0)
Monocytes Relative: 5.8 % (ref 3.0–12.0)
Neutro Abs: 2.8 10*3/uL (ref 1.4–7.7)
Neutrophils Relative %: 55.2 % (ref 43.0–77.0)
PLATELETS: 204 10*3/uL (ref 150.0–400.0)
RBC: 4.17 Mil/uL (ref 3.87–5.11)
RDW: 14 % (ref 11.5–15.5)
WBC: 5.2 10*3/uL (ref 4.0–10.5)

## 2014-08-03 LAB — HEPATIC FUNCTION PANEL
ALBUMIN: 4.1 g/dL (ref 3.5–5.2)
ALT: 18 U/L (ref 0–35)
AST: 17 U/L (ref 0–37)
Alkaline Phosphatase: 71 U/L (ref 39–117)
BILIRUBIN DIRECT: 0.1 mg/dL (ref 0.0–0.3)
Total Bilirubin: 0.5 mg/dL (ref 0.2–1.2)
Total Protein: 6.4 g/dL (ref 6.0–8.3)

## 2014-08-03 MED ORDER — DICYCLOMINE HCL 10 MG PO CAPS: 10.0000 mg | ORAL_CAPSULE | Freq: Three times a day (TID) | ORAL | Status: DC

## 2014-08-03 NOTE — Progress Notes (Signed)
Subjective:    Patient ID: Courtney Donovan, female    DOB: [birth year], 54 y.o.   MRN: [id number]  HPI  Courtney Donovan is a 54 yo female with a CC of diarrhea x 8 days.   1) Every day, > 6 x a day, 15 after eating, or with nothing on stomach cramping begins. 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   Review of Systems  Constitutional: Negative for fever, chills, diaphoresis and fatigue.  Gastrointestinal: Positive for abdominal pain and diarrhea. Negative for nausea, vomiting, constipation, blood in stool, abdominal distention, anal bleeding and rectal pain.       Cramping in the lower abdomen Excessive gas   Musculoskeletal: Negative for myalgias, back pain and neck pain.  Skin: Negative for rash.  Neurological: Negative for dizziness and headaches.  Hematological: Negative for adenopathy.  Psychiatric/Behavioral: Negative for sleep disturbance. The patient is not nervous/anxious.    Past Medical History  Diagnosis Date  . Hypertension   . Hypercholesterolemia   . Chronic headaches   . Anemia   . Diabetes mellitus     diet controlled  . Allergy     History   Social History  . Marital Status: Married    Spouse Name: N/A  . Number of Children: 2  . Years of Education: N/A   Occupational History  .     Social History Main Topics  . Smoking status: Never Smoker   . Smokeless tobacco: Never Used  . Alcohol Use: No  . Drug Use: No  . Sexual  Activity: Not on file   Other Topics Concern  . Not on file   Social History Narrative  . No narrative on file    Past Surgical History  Procedure Laterality Date  . Abdominal hysterectomy  2011    supracervical hysterectomy  . Tubal ligation  1996  . Tonsilectomy/adenoidectomy with myringotomy  1067    Family History  Problem Relation Age of Onset  . Hypertension Mother   . Cancer Father     lung  . Hypertension Father     Allergies  Allergen Reactions  . Metrogel [Metronidazole]     Current Outpatient Prescriptions on File Prior to Visit  Medication Sig Dispense Refill  . aspirin 81 MG chewable tablet Chew 81 mg by mouth daily.    . ergocalciferol (VITAMIN D2) 50000 UNITS capsule Take 1 capsule (50,000 Units total) by mouth once a week. 4 capsule 0  . fluticasone (FLONASE) 50 MCG/ACT nasal spray Place 2 sprays into both nostrils daily. 48 g 3  . Multiple Vitamin (MULTIVITAMIN) tablet Take 1 tablet by mouth daily.    Marland Kitchen omeprazole (PRILOSEC) 40 MG capsule Take 40 mg by mouth daily.    Marland Kitchen atorvastatin (LIPITOR) 10 MG tablet 1/2 tablet q day (Patient not taking: Reported on 08/03/2014) 45 tablet 1  . LORazepam (ATIVAN) 0.5 MG tablet 1/2 tablet daily prn (Patient not taking: Reported  on 08/03/2014) 20 tablet 0   No current facility-administered medications on file prior to visit.      Objective:   Physical Exam  Constitutional: She is oriented to person, place, and time. She appears well-developed and well-nourished. No distress.  BP 116/82 mmHg  Pulse 67  Temp(Src) 98.2 F (36.8 C)  Resp 16  Ht 5' (1.524 m)  Wt 173 lb 3.2 oz (78.563 kg)  BMI 33.83 kg/m2  SpO2 99%   HENT:  Head: Normocephalic and atraumatic.  Right Ear: External ear normal.  Left Ear: External ear normal.  Eyes: No scleral icterus.  Cardiovascular: Normal rate, regular rhythm and normal heart sounds.  Exam reveals no gallop and no friction rub.   No murmur heard. Pulmonary/Chest: Effort normal  and breath sounds normal. No respiratory distress. She has no wheezes. She has no rales. She exhibits no tenderness.  Abdominal: Soft. Bowel sounds are normal. She exhibits no distension and no mass. There is no tenderness. There is no rebound and no guarding.  Neurological: She is alert and oriented to person, place, and time. No cranial nerve deficit. She exhibits normal muscle tone. Coordination normal.  Skin: Skin is warm and dry. No rash noted. She is not diaphoretic.  Psychiatric: She has a normal mood and affect. Her behavior is normal. Judgment and thought content normal.          Assessment & Plan:

## 2014-08-03 NOTE — Patient Instructions (Signed)
Soft bland foods until we get back results.   Lots of water and ginger ale as needed.   Try the bentyl (can use up to 4 x a day) this will help slow things down  Side effects- drowsiness, dry mouth, blurry vision --> don't drive until you know how this affects you.

## 2014-08-03 NOTE — Progress Notes (Signed)
Pre visit review using our clinic review tool, if applicable. No additional management support is needed unless otherwise documented below in the visit note. 

## 2014-08-16 ENCOUNTER — Encounter: Payer: Self-pay | Admitting: Nurse Practitioner

## 2014-08-16 DIAGNOSIS — R197 Diarrhea, unspecified: Secondary | ICD-10-CM | POA: Insufficient documentation

## 2014-08-16 NOTE — Assessment & Plan Note (Signed)
Will obtain CBC w/ diff to r/out possible infection/parasites and HFP for gold colored stools and bloating. No recent abx use. Supportive care with bland foods, fluid replacement, Bentyl prescribed to pt and AVS has common adverse effects. Can take up to 4 x daily. FU prn worsening/failure to improve.

## 2014-09-11 ENCOUNTER — Telehealth: Payer: Self-pay | Admitting: Internal Medicine

## 2014-09-11 DIAGNOSIS — E7800 Pure hypercholesterolemia, unspecified: Secondary | ICD-10-CM

## 2014-09-11 DIAGNOSIS — E119 Type 2 diabetes mellitus without complications: Secondary | ICD-10-CM

## 2014-09-11 DIAGNOSIS — I10 Essential (primary) hypertension: Secondary | ICD-10-CM

## 2014-09-11 NOTE — Telephone Encounter (Signed)
Pt wants to know if lab work is needed before her appt? Thank You!

## 2014-09-15 NOTE — Telephone Encounter (Signed)
Please advise, patient had labs drawn in July

## 2014-09-15 NOTE — Telephone Encounter (Signed)
Yes she needs appt for fasting labs.  (cholesterol and sugar not checked).  Please schedule.  I have place the order for the labs.

## 2014-09-15 NOTE — Telephone Encounter (Signed)
Done.  Thank You. 

## 2014-09-17 ENCOUNTER — Other Ambulatory Visit: Payer: Self-pay | Admitting: Internal Medicine

## 2014-09-28 ENCOUNTER — Other Ambulatory Visit (INDEPENDENT_AMBULATORY_CARE_PROVIDER_SITE_OTHER): Payer: BLUE CROSS/BLUE SHIELD

## 2014-09-28 ENCOUNTER — Encounter: Payer: Self-pay | Admitting: Internal Medicine

## 2014-09-28 DIAGNOSIS — E119 Type 2 diabetes mellitus without complications: Secondary | ICD-10-CM | POA: Diagnosis not present

## 2014-09-28 DIAGNOSIS — E7800 Pure hypercholesterolemia, unspecified: Secondary | ICD-10-CM

## 2014-09-28 LAB — LIPID PANEL
CHOL/HDL RATIO: 4
Cholesterol: 179 mg/dL (ref 0–200)
HDL: 45.7 mg/dL (ref >39.00)
LDL Cholesterol: 123 mg/dL — ABNORMAL HIGH (ref 0–99)
NONHDL: 133.5
TRIGLYCERIDES: 54 mg/dL (ref 0.0–149.0)
VLDL: 10.8 mg/dL (ref 0.0–40.0)

## 2014-09-28 LAB — BASIC METABOLIC PANEL
BUN: 16 mg/dL (ref 6–23)
CO2: 30 mEq/L (ref 19–32)
CREATININE: 0.81 mg/dL (ref 0.40–1.20)
Calcium: 9.3 mg/dL (ref 8.4–10.5)
Chloride: 104 mEq/L (ref 96–112)
GFR: 95.4 mL/min (ref >60.00)
Glucose, Bld: 99 mg/dL (ref 70–99)
Potassium: 4.1 mEq/L (ref 3.5–5.1)
Sodium: 140 mEq/L (ref 135–145)

## 2014-09-28 LAB — HEMOGLOBIN A1C: Hgb A1c MFr Bld: 6.2 % (ref 4.6–6.5)

## 2014-09-28 LAB — HEPATIC FUNCTION PANEL
ALBUMIN: 4.2 g/dL (ref 3.5–5.2)
ALK PHOS: 72 U/L (ref 39–117)
ALT: 13 U/L (ref 0–35)
AST: 13 U/L (ref 0–37)
Bilirubin, Direct: 0.1 mg/dL (ref 0.0–0.3)
TOTAL PROTEIN: 7.1 g/dL (ref 6.0–8.3)
Total Bilirubin: 0.4 mg/dL (ref 0.2–1.2)

## 2014-09-28 LAB — TSH: TSH: 1.06 u[IU]/mL (ref 0.35–4.50)

## 2014-10-01 ENCOUNTER — Other Ambulatory Visit: Payer: BLUE CROSS/BLUE SHIELD

## 2014-10-01 NOTE — Telephone Encounter (Signed)
Unread mychart message mailed to patient 

## 2014-10-08 ENCOUNTER — Ambulatory Visit (INDEPENDENT_AMBULATORY_CARE_PROVIDER_SITE_OTHER): Payer: BLUE CROSS/BLUE SHIELD | Admitting: Internal Medicine

## 2014-10-08 ENCOUNTER — Encounter: Payer: Self-pay | Admitting: Internal Medicine

## 2014-10-08 ENCOUNTER — Other Ambulatory Visit (HOSPITAL_COMMUNITY)
Admission: RE | Admit: 2014-10-08 | Discharge: 2014-10-08 | Disposition: A | Discharge status: Home / Self Care | Payer: BLUE CROSS/BLUE SHIELD | Source: Ambulatory Visit | Attending: Internal Medicine | Admitting: Internal Medicine

## 2014-10-08 VITALS — BP 102/70 | HR 70 | Temp 98.3°F | Resp 18 | Ht 60.0 in | Wt 168.2 lb

## 2014-10-08 DIAGNOSIS — N760 Acute vaginitis: Secondary | ICD-10-CM | POA: Diagnosis not present

## 2014-10-08 DIAGNOSIS — Z01419 Encounter for gynecological examination (general) (routine) without abnormal findings: Secondary | ICD-10-CM | POA: Insufficient documentation

## 2014-10-08 DIAGNOSIS — Z124 Encounter for screening for malignant neoplasm of cervix: Secondary | ICD-10-CM | POA: Diagnosis not present

## 2014-10-08 DIAGNOSIS — Z0000 Encounter for general adult medical examination without abnormal findings: Secondary | ICD-10-CM | POA: Diagnosis not present

## 2014-10-08 DIAGNOSIS — Z1151 Encounter for screening for human papillomavirus (HPV): Secondary | ICD-10-CM | POA: Insufficient documentation

## 2014-10-08 DIAGNOSIS — E559 Vitamin D deficiency, unspecified: Secondary | ICD-10-CM

## 2014-10-08 DIAGNOSIS — D649 Anemia, unspecified: Secondary | ICD-10-CM

## 2014-10-08 DIAGNOSIS — I10 Essential (primary) hypertension: Secondary | ICD-10-CM

## 2014-10-08 DIAGNOSIS — E7800 Pure hypercholesterolemia, unspecified: Secondary | ICD-10-CM

## 2014-10-08 DIAGNOSIS — E119 Type 2 diabetes mellitus without complications: Secondary | ICD-10-CM

## 2014-10-08 DIAGNOSIS — K219 Gastro-esophageal reflux disease without esophagitis: Secondary | ICD-10-CM

## 2014-10-08 DIAGNOSIS — R5383 Other fatigue: Secondary | ICD-10-CM | POA: Diagnosis not present

## 2014-10-08 MED ORDER — LORAZEPAM 0.5 MG PO TABS: ORAL_TABLET | ORAL | Status: DC

## 2014-10-08 NOTE — Progress Notes (Signed)
Patient ID: Courtney Donovan, female   DOB: Jul 24, 1960, 54 y.o.   MRN: [id number]   Subjective:    Patient ID: Courtney Donovan, female    DOB: 1960/12/29, 54 y.o.   MRN: [id number]  HPI  Patient with past history of hypertension, GERD, diabetes and hypercholesterolemia who comes in today to follow up on these issues as well as for a complete physical exam.  She reports she is doing better.  Feels better.  Has adjusted her diet. Lost weight.  Just had EGD 08/19/14.  EGD revealed normal esophagus, small hiatal hernia and duodenitis.  On omeprazole.  No abdominal pain or cramping.  Bowels stable.  She is planning to change jobs.  Working from home.  With the change, wants refill of lorazepam.  Only takes this prn.  Helps her relax.  Has noticed some decreased energy.  Taking vitamin D supplements.  Started back on prescription vitamin D recently - 3 weeks ago.  Would like B12 checked.    Past Medical History  Diagnosis Date  . Hypertension   . Hypercholesterolemia   . Chronic headaches   . Anemia   . Diabetes mellitus ([place])     diet controlled  . Allergy    Past Surgical History  Procedure Laterality Date  . Abdominal hysterectomy  2011    supracervical hysterectomy  . Tubal ligation  1996  . Tonsilectomy/adenoidectomy with myringotomy  1067   Family History  Problem Relation Age of Onset  . Hypertension Mother   . Cancer Father     lung  . Hypertension Father    Social History   Social History  . Marital Status: Married    Spouse Name: N/A  . Number of Children: 2  . Years of Education: N/A   Occupational History  .     Social History Main Topics  . Smoking status: Never Smoker   . Smokeless tobacco: Never Used  . Alcohol Use: No  . Drug Use: No  . Sexual Activity: Not Asked   Other Topics Concern  . None   Social History Narrative    Outpatient Encounter Prescriptions as of 10/08/2014  Medication Sig  . aspirin 81 MG chewable tablet  Chew 81 mg by mouth daily.  . ergocalciferol (VITAMIN D2) 50000 UNITS capsule Take 1 capsule (50,000 Units total) by mouth once a week.  . fluticasone (FLONASE) 50 MCG/ACT nasal spray USE 2 SPRAYS IN EACH NOSTRIL DAILY  . Multiple Vitamin (MULTIVITAMIN) tablet Take 1 tablet by mouth daily.  Marland Kitchen omeprazole (PRILOSEC) 40 MG capsule Take 40 mg by mouth daily.  . [DISCONTINUED] dicyclomine (BENTYL) 10 MG capsule Take 1 capsule (10 mg total) by mouth 4 (four) times daily -  before meals and at bedtime.  Marland Kitchen atorvastatin (LIPITOR) 10 MG tablet 1/2 tablet q day (Patient not taking: Reported on 10/08/2014)  . LORazepam (ATIVAN) 0.5 MG tablet Take 1/2 -1 tablet q day prn.  . [DISCONTINUED] LORazepam (ATIVAN) 0.5 MG tablet 1/2 tablet daily prn (Patient not taking: Reported on 10/08/2014)   No facility-administered encounter medications on file as of 10/08/2014.    Review of Systems  Constitutional: Negative for unexpected weight change.       Has adjusted her diet.  Lost weight.   HENT: Negative for congestion and sinus pressure.   Eyes: Negative for pain and visual disturbance.  Respiratory: Negative for cough, chest tightness and shortness of breath.   Cardiovascular: Negative for chest pain, palpitations and leg swelling.  Gastrointestinal: Negative for nausea, vomiting, abdominal pain and diarrhea.  Genitourinary: Negative for dysuria and difficulty urinating.  Musculoskeletal: Negative for back pain and joint swelling.  Skin: Negative for color change and rash.  Neurological: Negative for dizziness, light-headedness and headaches.  Hematological: Negative for adenopathy. Does not bruise/bleed easily.  Psychiatric/Behavioral: Negative for dysphoric mood and agitation.       Objective:    Physical Exam  Constitutional: She is oriented to person, place, and time. She appears well-developed and well-nourished. No distress.  HENT:  Nose: Nose normal.  Mouth/Throat: Oropharynx is clear and moist.    Eyes: Right eye exhibits no discharge. Left eye exhibits no discharge. No scleral icterus.  Neck: Neck supple. No thyromegaly present.  Cardiovascular: Normal rate and regular rhythm.   Pulmonary/Chest: Breath sounds normal. No accessory muscle usage. No tachypnea. No respiratory distress. She has no decreased breath sounds. She has no wheezes. She has no rhonchi. Right breast exhibits no inverted nipple, no mass, no nipple discharge and no tenderness (no axillary adenopathy). Left breast exhibits no inverted nipple, no mass, no nipple discharge and no tenderness (no axilarry adenopathy).  Abdominal: Soft. Bowel sounds are normal. There is no tenderness.  Genitourinary:  Normal external genitalia.  Vaginal vault without lesions.  Cervix identified.  Pap smear performed.  Could not appreciate any adnexal masses or tenderness.  Discharge present.  Wet prep sent.    Musculoskeletal: She exhibits no edema or tenderness.  Lymphadenopathy:    She has no cervical adenopathy.  Neurological: She is alert and oriented to person, place, and time.  Skin: Skin is warm. No rash noted. No erythema.  Psychiatric: She has a normal mood and affect. Her behavior is normal.    BP 102/70 mmHg  Pulse 70  Temp(Src) 98.3 F (36.8 C) (Oral)  Resp 18  Ht 5' (1.524 m)  Wt 168 lb 4 oz (76.318 kg)  BMI 32.86 kg/m2  SpO2 97% Wt Readings from Last 3 Encounters:  10/08/14 168 lb 4 oz (76.318 kg)  08/03/14 173 lb 3.2 oz (78.563 kg)  10/02/13 177 lb 8 oz (80.513 kg)     Lab Results  Component Value Date   WBC 5.2 08/03/2014   HGB 12.0 08/03/2014   HCT 36.3 08/03/2014   PLT 204.0 08/03/2014   GLUCOSE 99 09/28/2014   CHOL 179 09/28/2014   TRIG 54.0 09/28/2014   HDL 45.70 09/28/2014   LDLDIRECT 159.5 01/16/2013   LDLCALC 123* 09/28/2014   ALT 13 09/28/2014   AST 13 09/28/2014   NA 140 09/28/2014   K 4.1 09/28/2014   CL 104 09/28/2014   CREATININE 0.81 09/28/2014   BUN 16 09/28/2014   CO2 30 09/28/2014    TSH 1.06 09/28/2014   HGBA1C 6.2 09/28/2014   MICROALBUR <0.7 04/15/2014       Assessment & Plan:   Problem List Items Addressed This Visit    Anemia    Check cbc and b12.        Diabetes mellitus ([place])    Has adjusted her diet.  Lost weight.  Feels better.  Follow met b and a1c.        Fatigue    Some fatigue, but overall feels better.  Check vitamin D level and B12.        Relevant Orders   Vitamin B12 (Completed)   GERD (gastroesophageal reflux disease)    EGD 08/19/14 as outlined.  Has adjusted her diet.  On omeprazole.  Upper symptoms controlled.  Follow.        Health care maintenance    Physical today 10/08/14.  PAP 10/08/14.  Mammogram 02/03/14 - Birads I.  Colonoscopy 11/2012 - internal hemorrhoids otherwise normal.  Recommended f/u colonoscopy in 10 years.        Hypercholesterolemia    Not on cholesterol medication.  She has adjusted her diet.  Has lost weight.  LDL 123.  If cholesterol continues to improve, will hold on medication.  Follow.        Hypertension    Blood pressure under good control.  Continue same medication regimen.  Follow pressures.  Follow metabolic panel.        Vitamin D deficiency - Primary    Has a history of vitamin D deficiency.  Back on vitamin D supplements.  Just started.  Recheck vitamin D level.        Relevant Orders   Vit D  25 hydroxy (rtn osteoporosis monitoring) (Completed)    Other Visit Diagnoses    Pap smear for cervical cancer screening        Relevant Orders    Cytology - PAP (Completed)    Vaginitis            SCOTT, CHARLENE, MD  

## 2014-10-08 NOTE — Progress Notes (Signed)
Pre-visit discussion using our clinic review tool. No additional management support is needed unless otherwise documented below in the visit note.  

## 2014-10-09 LAB — WET PREP BY MOLECULAR PROBE
CANDIDA SPECIES: NEGATIVE
GARDNERELLA VAGINALIS: POSITIVE — AB
TRICHOMONAS VAG: NEGATIVE

## 2014-10-09 LAB — VITAMIN D 25 HYDROXY (VIT D DEFICIENCY, FRACTURES): VITD: 25.08 ng/mL — AB (ref 30.00–100.00)

## 2014-10-09 LAB — CYTOLOGY - PAP

## 2014-10-09 LAB — VITAMIN B12: Vitamin B-12: 397 pg/mL (ref 211–911)

## 2014-10-12 ENCOUNTER — Encounter: Payer: Self-pay | Admitting: Internal Medicine

## 2014-10-12 ENCOUNTER — Other Ambulatory Visit: Payer: Self-pay | Admitting: *Deleted

## 2014-10-12 DIAGNOSIS — Z0000 Encounter for general adult medical examination without abnormal findings: Secondary | ICD-10-CM | POA: Insufficient documentation

## 2014-10-12 DIAGNOSIS — R5383 Other fatigue: Secondary | ICD-10-CM | POA: Insufficient documentation

## 2014-10-12 MED ORDER — CLINDAMYCIN PHOSPHATE 2 % VA CREA: 1.0000 | TOPICAL_CREAM | Freq: Every day | VAGINAL | Status: DC

## 2014-10-12 NOTE — Assessment & Plan Note (Signed)
Blood pressure under good control.  Continue same medication regimen.  Follow pressures.  Follow metabolic panel.   

## 2014-10-12 NOTE — Assessment & Plan Note (Signed)
Has a history of vitamin D deficiency.  Back on vitamin D supplements.  Just started.  Recheck vitamin D level.

## 2014-10-12 NOTE — Assessment & Plan Note (Signed)
Some fatigue, but overall feels better.  Check vitamin D level and B12.

## 2014-10-12 NOTE — Assessment & Plan Note (Signed)
Check cbc and b12.

## 2014-10-12 NOTE — Assessment & Plan Note (Signed)
EGD 08/19/14 as outlined.  Has adjusted her diet.  On omeprazole.  Upper symptoms controlled.  Follow.

## 2014-10-12 NOTE — Assessment & Plan Note (Signed)
Not on cholesterol medication.  She has adjusted her diet.  Has lost weight.  LDL 123.  If cholesterol continues to improve, will hold on medication.  Follow.

## 2014-10-12 NOTE — Assessment & Plan Note (Signed)
Physical today 10/08/14.  PAP 10/08/14.  Mammogram 02/03/14 - Birads I.  Colonoscopy 11/2012 - internal hemorrhoids otherwise normal.  Recommended f/u colonoscopy in 10 years.

## 2014-10-12 NOTE — Assessment & Plan Note (Signed)
Has adjusted her diet.  Lost weight.  Feels better.  Follow met b and a1c.

## 2014-10-19 ENCOUNTER — Encounter: Payer: Self-pay | Admitting: Internal Medicine

## 2015-02-03 ENCOUNTER — Telehealth: Payer: Self-pay | Admitting: *Deleted

## 2015-02-03 NOTE — Telephone Encounter (Signed)
Member ID [id number]

## 2015-02-03 NOTE — Telephone Encounter (Signed)
Patient has requested a Pre- authorization for her Omeprazole Call [phone] Fax [phone] Pharmacy [facility] on [address]

## 2015-02-04 ENCOUNTER — Telehealth: Payer: Self-pay

## 2015-02-04 NOTE — Telephone Encounter (Signed)
Paper fax being sent

## 2015-02-04 NOTE — Telephone Encounter (Signed)
I can see her tomorrow to determine treatment.  Please put her in the 9:00 spot.

## 2015-02-04 NOTE — Telephone Encounter (Signed)
Pt states that she used  different soap that caused irritation in her vaginal area, wants to know what she can use OTC to help with irritation.

## 2015-02-05 NOTE — Telephone Encounter (Signed)
Pt scheduled for the 4:30 slot.

## 2015-02-05 NOTE — Telephone Encounter (Signed)
Please see comment below & contact patient. We do not see patients after 4:30.

## 2015-02-05 NOTE — Telephone Encounter (Signed)
Pt declined 9:00 am slot b/c she is in training for work, states she cannot come for an appointment until after 4:30pm. Dr.Tullo has a 6:30 appointment on Monday. Pt is willing to be seen at that time. Please advise

## 2015-02-05 NOTE — Telephone Encounter (Signed)
thanks

## 2015-02-05 NOTE — Telephone Encounter (Signed)
Dr Lupita Dawn saves these spots for her acute visits.  I can see her at 4:00 on Monday for work in for this problem - if she wants me to evaluate.

## 2015-02-05 NOTE — Telephone Encounter (Signed)
[id number]

## 2015-02-08 ENCOUNTER — Ambulatory Visit (INDEPENDENT_AMBULATORY_CARE_PROVIDER_SITE_OTHER): Payer: 59 | Admitting: Internal Medicine

## 2015-02-08 ENCOUNTER — Encounter: Payer: Self-pay | Admitting: Internal Medicine

## 2015-02-08 VITALS — BP 120/80 | HR 78 | Temp 97.6°F | Resp 18 | Ht 60.0 in | Wt 178.5 lb

## 2015-02-08 DIAGNOSIS — E119 Type 2 diabetes mellitus without complications: Secondary | ICD-10-CM | POA: Diagnosis not present

## 2015-02-08 DIAGNOSIS — I10 Essential (primary) hypertension: Secondary | ICD-10-CM

## 2015-02-08 DIAGNOSIS — N898 Other specified noninflammatory disorders of vagina: Secondary | ICD-10-CM | POA: Diagnosis not present

## 2015-02-08 DIAGNOSIS — E7800 Pure hypercholesterolemia, unspecified: Secondary | ICD-10-CM

## 2015-02-08 DIAGNOSIS — R21 Rash and other nonspecific skin eruption: Secondary | ICD-10-CM | POA: Diagnosis not present

## 2015-02-08 MED ORDER — FLUTICASONE PROPIONATE 50 MCG/ACT NA SUSP: 2.0000 | Freq: Every day | NASAL | Status: DC

## 2015-02-08 MED ORDER — MUPIROCIN 2 % EX OINT: 1.0000 "application " | TOPICAL_OINTMENT | Freq: Two times a day (BID) | CUTANEOUS | Status: DC

## 2015-02-08 NOTE — Progress Notes (Signed)
Pre-visit discussion using our clinic review tool. No additional management support is needed unless otherwise documented below in the visit note.  

## 2015-02-08 NOTE — Progress Notes (Signed)
Patient ID: Courtney Donovan, female   DOB: 08/24/1960, 55 y.o.   MRN: [id number]   Subjective:    Patient ID: Courtney Donovan, female    DOB: 1960-03-26, 55 y.o.   MRN: [id number]  HPI  Patient with past history of hypercholesterolemia, diabetes and hypertension.  She comes in today as a work in with concerns regarding persistent vaginal irritation.  She states she used safeguard (which she normally does not use).  The next day noticed some vaginal irritation.  Has persisted. Feels like irritation is mostly on outside of vagina.  Question if intravaginal involvement.  No significant discharge.  No abdominal pain or cramping.  Bowels stable.  Has been watching her diet.  Discussed exercise.  Has lost weight. Reports return of minimal rash - neck.  No itching.      Past Medical History  Diagnosis Date  . Hypertension   . Hypercholesterolemia   . Chronic headaches   . Anemia   . Diabetes mellitus ([place])     diet controlled  . Allergy    Past Surgical History  Procedure Laterality Date  . Abdominal hysterectomy  2011    supracervical hysterectomy  . Tubal ligation  1996  . Tonsilectomy/adenoidectomy with myringotomy  1067   Family History  Problem Relation Age of Onset  . Hypertension Mother   . Cancer Father     lung  . Hypertension Father    Social History   Social History  . Marital Status: Married    Spouse Name: N/A  . Number of Children: 2  . Years of Education: N/A   Occupational History  .     Social History Main Topics  . Smoking status: Never Smoker   . Smokeless tobacco: Never Used  . Alcohol Use: No  . Drug Use: No  . Sexual Activity: Not Asked   Other Topics Concern  . None   Social History Narrative    Outpatient Encounter Prescriptions as of 02/08/2015  Medication Sig  . aspirin 81 MG chewable tablet Chew 81 mg by mouth daily.  Marland Kitchen atorvastatin (LIPITOR) 10 MG tablet 1/2 tablet q day  . ergocalciferol (VITAMIN D2) 50000  UNITS capsule Take 1 capsule (50,000 Units total) by mouth once a week.  . fluticasone (FLONASE) 50 MCG/ACT nasal spray Place 2 sprays into both nostrils daily.  Marland Kitchen LORazepam (ATIVAN) 0.5 MG tablet Take 1/2 -1 tablet q day prn.  . Multiple Vitamin (MULTIVITAMIN) tablet Take 1 tablet by mouth daily.  . [DISCONTINUED] fluticasone (FLONASE) 50 MCG/ACT nasal spray USE 2 SPRAYS IN EACH NOSTRIL DAILY  . [DISCONTINUED] omeprazole (PRILOSEC) 40 MG capsule Take 40 mg by mouth daily.  . [DISCONTINUED] clindamycin (CLEOCIN) 2 % vaginal cream Place 1 Applicatorful vaginally at bedtime. X 7 nights  . [DISCONTINUED] mupirocin ointment (BACTROBAN) 2 % Place 1 application into the nose 2 (two) times daily.   No facility-administered encounter medications on file as of 02/08/2015.    Review of Systems  Constitutional: Negative for appetite change.       Has adjusted her diet.  Lost weight.    HENT: Negative for congestion and sinus pressure.   Respiratory: Negative for cough and shortness of breath.   Cardiovascular: Negative for palpitations and leg swelling.  Gastrointestinal: Negative for nausea, vomiting, abdominal pain and diarrhea.  Genitourinary: Negative for dysuria and difficulty urinating.       Persistent vaginal irritation.    Musculoskeletal: Negative for back pain and joint swelling.  Objective:    Physical Exam  Constitutional: She appears well-developed and well-nourished. No distress.  HENT:  Nose: Nose normal.  Mouth/Throat: Oropharynx is clear and moist.  Neck: Neck supple.  Cardiovascular: Normal rate and regular rhythm.   Pulmonary/Chest: Breath sounds normal. No respiratory distress. She has no wheezes.  Abdominal: Soft. Bowel sounds are normal. There is no tenderness.  Genitourinary:  Normal external genitalia.  Vaginal vault without lesions.  Discharge present.  Could not appreciated any adnexal masses or tenderness.    Musculoskeletal: She exhibits no edema or  tenderness.  Lymphadenopathy:    She has no cervical adenopathy.    BP 120/80 mmHg  Pulse 78  Temp(Src) 97.6 F (36.4 C) (Oral)  Resp 18  Ht 5' (1.524 m)  Wt 178 lb 8 oz (80.967 kg)  BMI 34.86 kg/m2  SpO2 99% Wt Readings from Last 3 Encounters:  02/08/15 178 lb 8 oz (80.967 kg)  10/08/14 168 lb 4 oz (76.318 kg)  08/03/14 173 lb 3.2 oz (78.563 kg)     Lab Results  Component Value Date   WBC 5.2 08/03/2014   HGB 12.0 08/03/2014   HCT 36.3 08/03/2014   PLT 204.0 08/03/2014   GLUCOSE 99 09/28/2014   CHOL 179 09/28/2014   TRIG 54.0 09/28/2014   HDL 45.70 09/28/2014   LDLDIRECT 159.5 01/16/2013   LDLCALC 123* 09/28/2014   ALT 13 09/28/2014   AST 13 09/28/2014   NA 140 09/28/2014   K 4.1 09/28/2014   CL 104 09/28/2014   CREATININE 0.81 09/28/2014   BUN 16 09/28/2014   CO2 30 09/28/2014   TSH 1.06 09/28/2014   HGBA1C 6.2 09/28/2014   MICROALBUR <0.7 04/15/2014       Assessment & Plan:   Problem List Items Addressed This Visit    Diabetes mellitus ([place])    Has adjusted her diet.  Lost weight.  Follow met b and a1c.        Relevant Orders   Hemoglobin A1c   Hypercholesterolemia   Relevant Orders   Lipid panel   Hepatic function panel   Hypertension    Blood pressure under good control.  Continue same medication regimen.  Follow pressures.  Follow metabolic panel.        Relevant Orders   Basic metabolic panel   Rash of neck    bactroban as directed.  Follow.        Vaginal irritation    Exam as outlined.  KOH and wet prep sent.  Await results before treatment.  Pt in agreement.         Other Visit Diagnoses    Vaginal discharge    -  Primary        Einar Pheasant, MD

## 2015-02-10 ENCOUNTER — Telehealth: Payer: Self-pay | Admitting: Internal Medicine

## 2015-02-10 ENCOUNTER — Encounter: Payer: Self-pay | Admitting: *Deleted

## 2015-02-10 ENCOUNTER — Other Ambulatory Visit: Payer: Self-pay | Admitting: *Deleted

## 2015-02-10 MED ORDER — OMEPRAZOLE 40 MG PO CPDR: 40.0000 mg | DELAYED_RELEASE_CAPSULE | Freq: Every day | ORAL | Status: DC

## 2015-02-10 NOTE — Telephone Encounter (Signed)
Pt.notified

## 2015-02-10 NOTE — Telephone Encounter (Signed)
Pt called about her medication mupirocin ointment (BACTROBAN) 2 % which said apply to nose. Pt gave it back and the pharmacist ask if she had surgery on her hand for cut. Pt felt it was a mistake. Can the prescription be clarified with pt? Call pt @ [phone]. Thank you!

## 2015-02-10 NOTE — Telephone Encounter (Signed)
This ointment was for her neck.  Apparently in the computer, it has nose written in instructions.  I do not have her vagianal swab back.  I will check on the swab and let her know what is needed for the vaginal area.  This ointment is for her neck.  Thanks

## 2015-02-10 NOTE — Telephone Encounter (Signed)
Pt was in for vaginal issues. Please advise if you meant to send something else in.

## 2015-02-10 NOTE — Telephone Encounter (Signed)
See other message.  I sent to wrong person.

## 2015-02-11 ENCOUNTER — Other Ambulatory Visit: Payer: Self-pay | Admitting: *Deleted

## 2015-02-11 LAB — WET PREP BY MOLECULAR PROBE
Candida species: POSITIVE — AB
GARDNERELLA VAGINALIS: POSITIVE — AB
Trichomonas vaginosis: NEGATIVE

## 2015-02-11 MED ORDER — MUPIROCIN 2 % EX OINT: 1.0000 "application " | TOPICAL_OINTMENT | Freq: Two times a day (BID) | CUTANEOUS | Status: DC

## 2015-02-11 MED ORDER — FLUCONAZOLE 150 MG PO TABS: 150.0000 mg | ORAL_TABLET | Freq: Every day | ORAL | Status: DC

## 2015-02-11 MED ORDER — CLINDAMYCIN PHOSPHATE 2 % VA CREA: 1.0000 | TOPICAL_CREAM | Freq: Every day | VAGINAL | Status: DC

## 2015-02-11 NOTE — Telephone Encounter (Signed)
RX sent in. See lab result

## 2015-02-15 ENCOUNTER — Encounter: Payer: Self-pay | Admitting: Internal Medicine

## 2015-02-15 DIAGNOSIS — N898 Other specified noninflammatory disorders of vagina: Secondary | ICD-10-CM | POA: Insufficient documentation

## 2015-02-15 NOTE — Assessment & Plan Note (Signed)
Has adjusted her diet.  Lost weight.  Follow met b and a1c.  

## 2015-02-15 NOTE — Assessment & Plan Note (Signed)
Blood pressure under good control.  Continue same medication regimen.  Follow pressures.  Follow metabolic panel.   

## 2015-02-15 NOTE — Assessment & Plan Note (Signed)
bactroban as directed.  Follow  

## 2015-02-15 NOTE — Assessment & Plan Note (Signed)
Exam as outlined.  KOH and wet prep sent.  Await results before treatment.  Pt in agreement.

## 2015-03-27 IMAGING — MG MM DIGITAL SCREENING BILAT W/ CAD
4 series · 4 of 4 positions shown · non-contrast
Comparison: Previous exam(s).

CLINICAL DATA: Screening.

EXAM:
DIGITAL SCREENING BILATERAL MAMMOGRAM WITH CAD

[R CC]
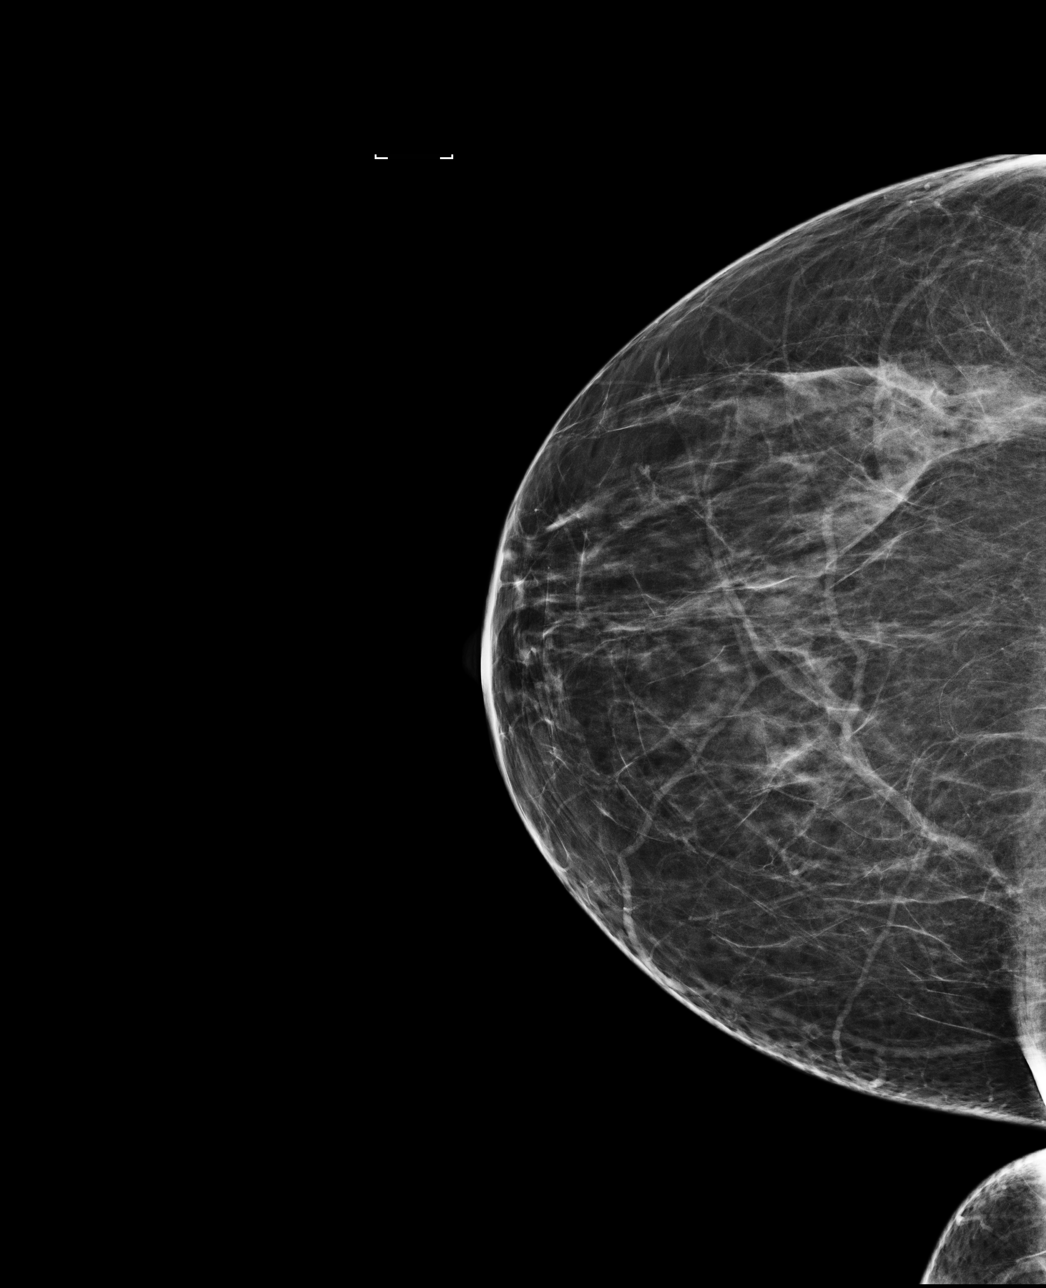

[L MLO]
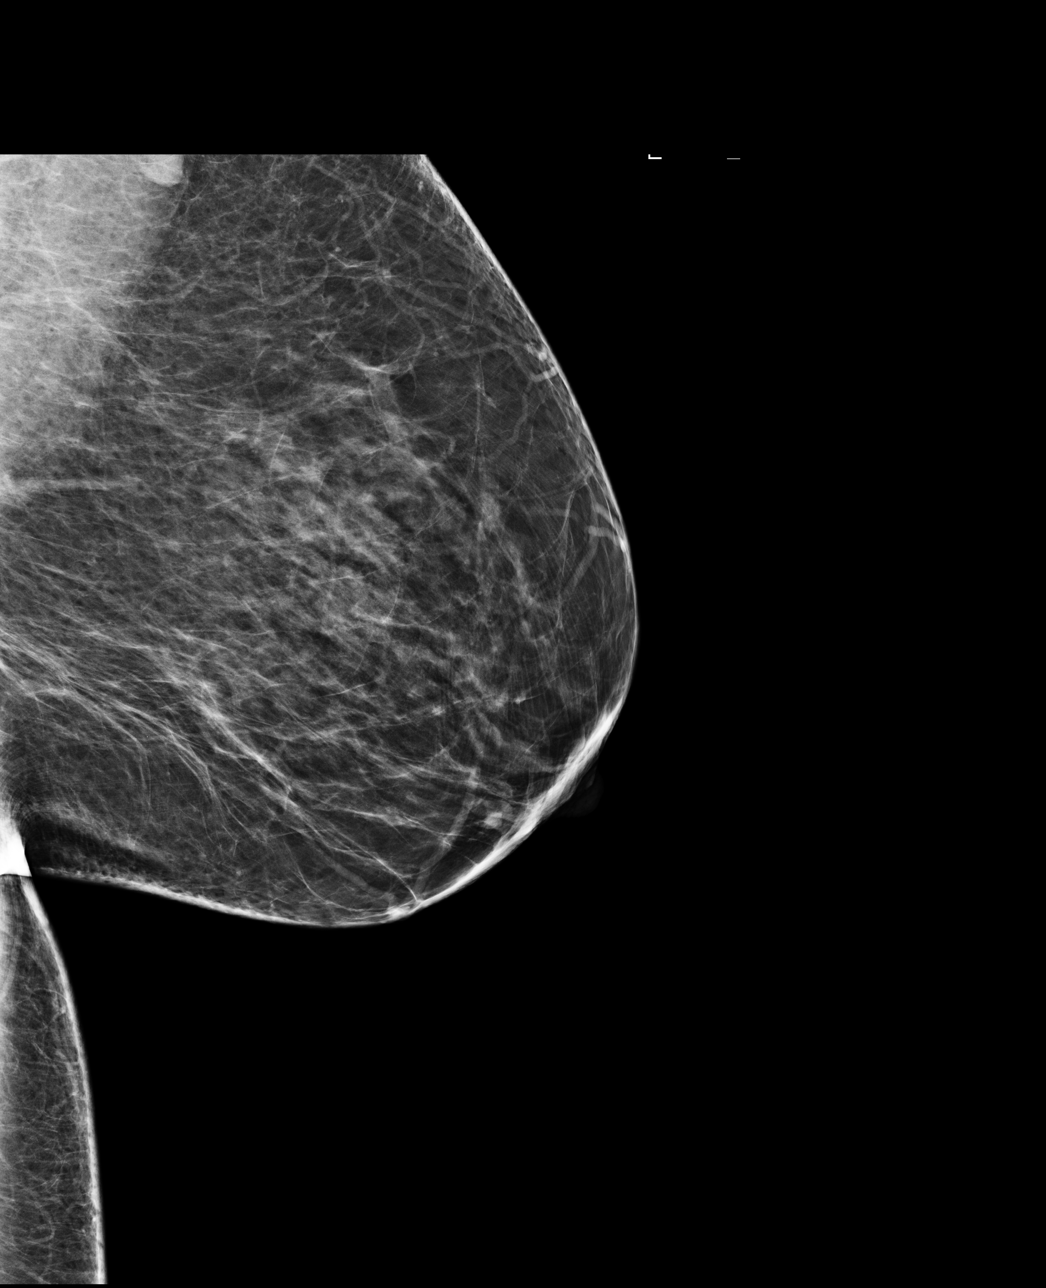

[L CC]
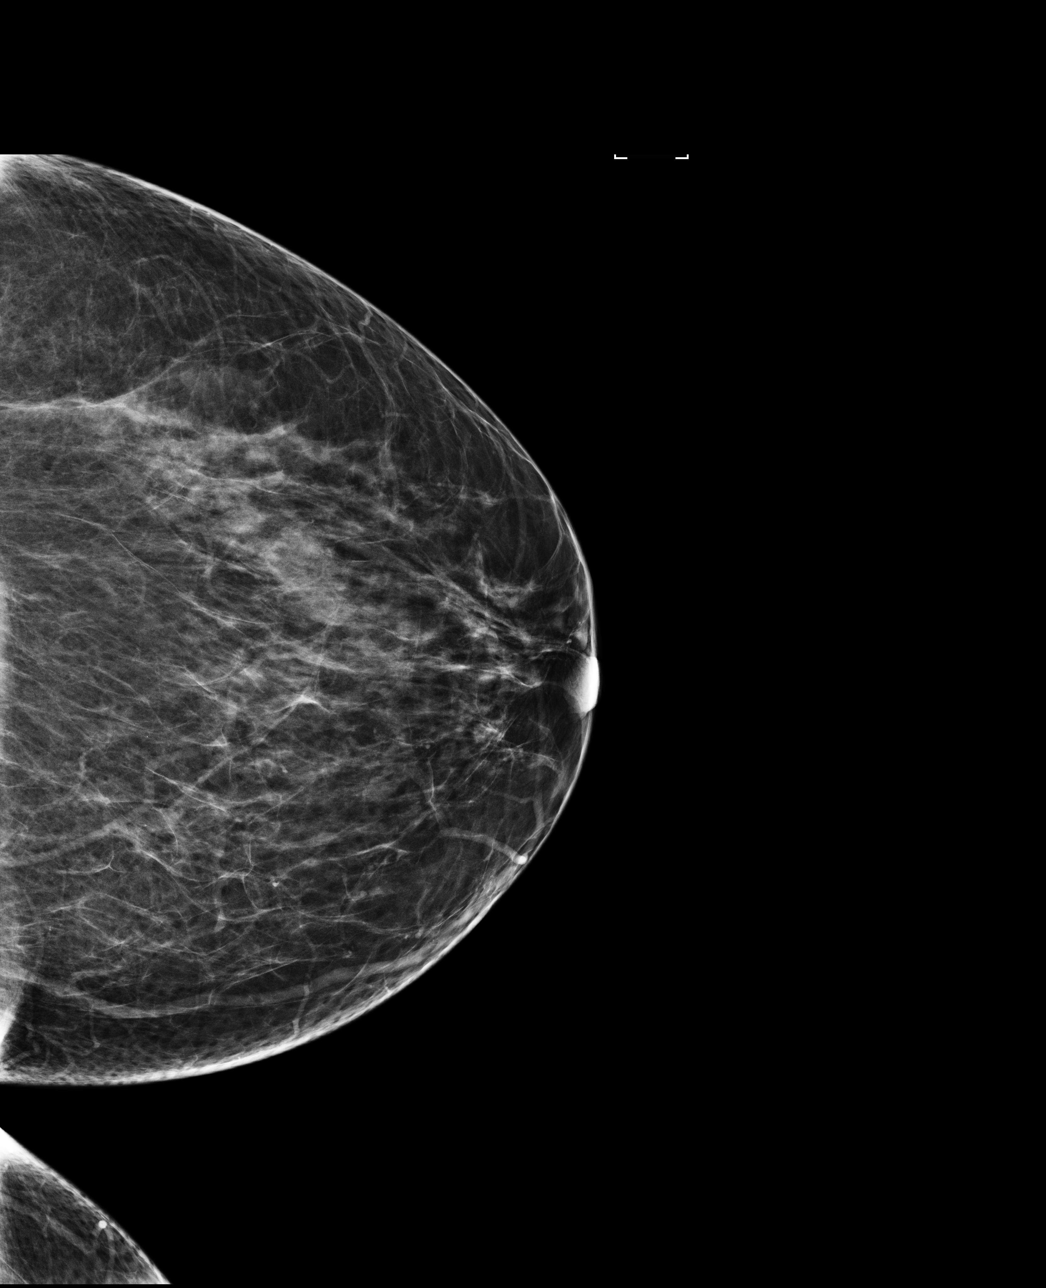

[R MLO]
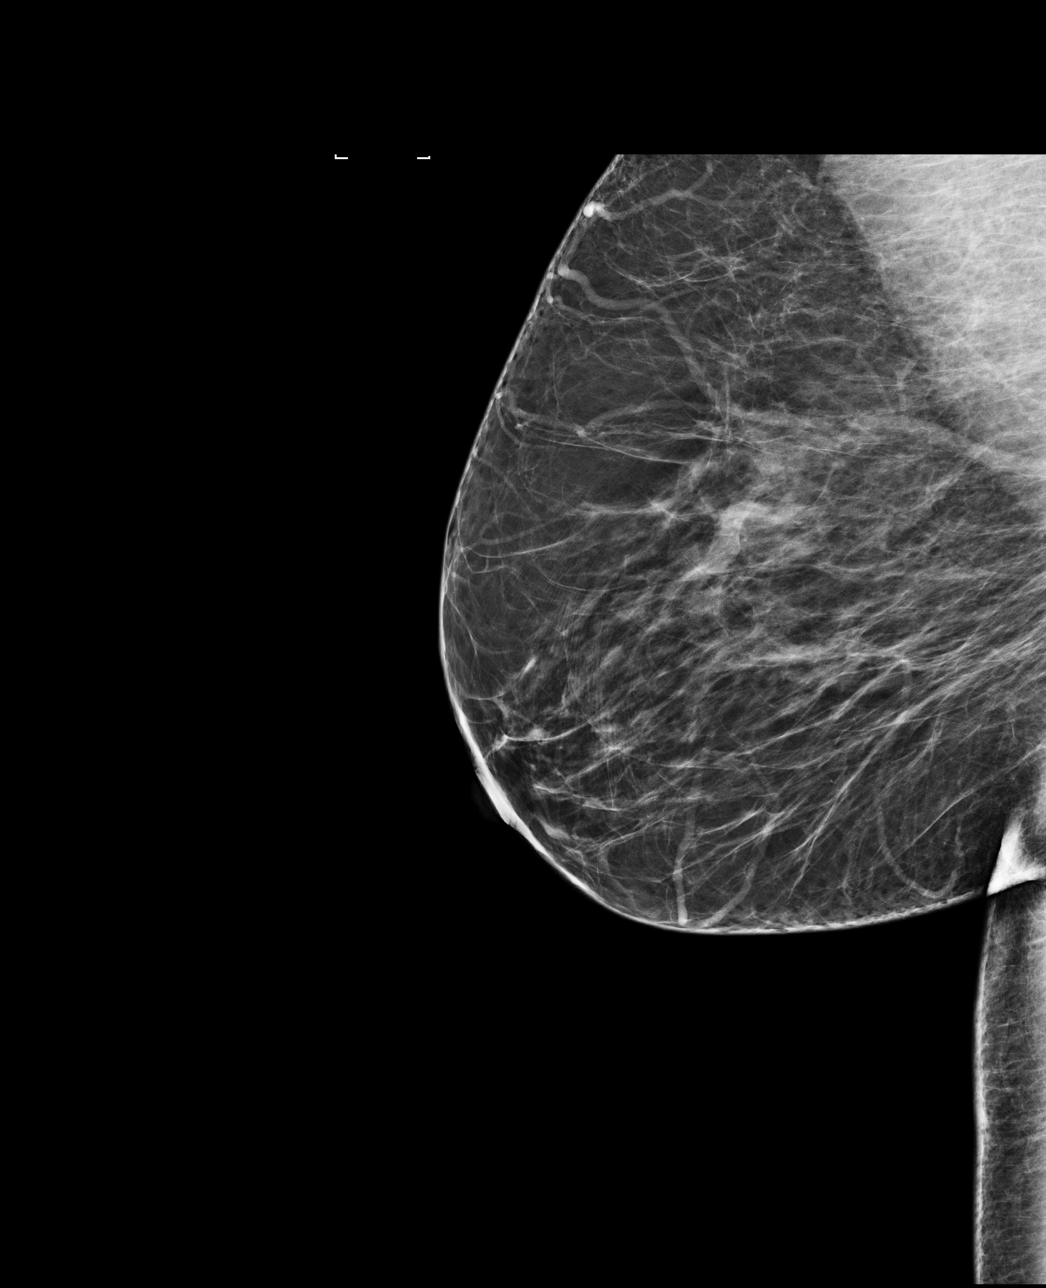

[4 of 4 positions shown; findings below may reference images not displayed]

ACR Breast Density Category b: There are scattered areas of
fibroglandular density.
FINDINGS: There are no findings suspicious for malignancy. Images were
processed with CAD.
IMPRESSION: No mammographic evidence of malignancy. A result letter of this
screening mammogram will be mailed directly to the patient.

RECOMMENDATION:
Screening mammogram in one year. (Code:AS-G-LCT)

BI-RADS CATEGORY  1: Negative.

## 2015-03-29 ENCOUNTER — Other Ambulatory Visit: Payer: Self-pay | Admitting: Internal Medicine

## 2015-03-29 DIAGNOSIS — Z1231 Encounter for screening mammogram for malignant neoplasm of breast: Secondary | ICD-10-CM

## 2015-03-30 ENCOUNTER — Ambulatory Visit
Admission: RE | Admit: 2015-03-30 | Discharge: 2015-03-30 | Disposition: A | Discharge status: Home / Self Care | Payer: 59 | Source: Ambulatory Visit | Attending: Internal Medicine | Admitting: Internal Medicine

## 2015-03-30 DIAGNOSIS — Z1231 Encounter for screening mammogram for malignant neoplasm of breast: Secondary | ICD-10-CM

## 2015-04-15 ENCOUNTER — Other Ambulatory Visit: Payer: 59

## 2015-04-15 ENCOUNTER — Other Ambulatory Visit (INDEPENDENT_AMBULATORY_CARE_PROVIDER_SITE_OTHER): Payer: 59

## 2015-04-15 DIAGNOSIS — I10 Essential (primary) hypertension: Secondary | ICD-10-CM

## 2015-04-15 DIAGNOSIS — E119 Type 2 diabetes mellitus without complications: Secondary | ICD-10-CM

## 2015-04-15 DIAGNOSIS — E7800 Pure hypercholesterolemia, unspecified: Secondary | ICD-10-CM | POA: Diagnosis not present

## 2015-04-16 ENCOUNTER — Encounter: Payer: Self-pay | Admitting: Internal Medicine

## 2015-04-16 LAB — LIPID PANEL
CHOLESTEROL: 220 mg/dL — AB (ref 0–200)
HDL: 51.9 mg/dL (ref >39.00)
LDL Cholesterol: 151 mg/dL — ABNORMAL HIGH (ref 0–99)
NonHDL: 167.73
TRIGLYCERIDES: 83 mg/dL (ref 0.0–149.0)
Total CHOL/HDL Ratio: 4
VLDL: 16.6 mg/dL (ref 0.0–40.0)

## 2015-04-16 LAB — BASIC METABOLIC PANEL
BUN: 12 mg/dL (ref 6–23)
CHLORIDE: 101 meq/L (ref 96–112)
CO2: 30 meq/L (ref 19–32)
CREATININE: 0.83 mg/dL (ref 0.40–1.20)
Calcium: 10 mg/dL (ref 8.4–10.5)
GFR: 92.56 mL/min (ref >60.00)
GLUCOSE: 84 mg/dL (ref 70–99)
POTASSIUM: 4 meq/L (ref 3.5–5.1)
Sodium: 138 mEq/L (ref 135–145)

## 2015-04-16 LAB — HEPATIC FUNCTION PANEL
ALT: 10 U/L (ref 0–35)
AST: 13 U/L (ref 0–37)
Albumin: 4.5 g/dL (ref 3.5–5.2)
Alkaline Phosphatase: 67 U/L (ref 39–117)
Bilirubin, Direct: 0.1 mg/dL (ref 0.0–0.3)
TOTAL PROTEIN: 7.7 g/dL (ref 6.0–8.3)
Total Bilirubin: 0.4 mg/dL (ref 0.2–1.2)

## 2015-04-16 LAB — HEMOGLOBIN A1C: HEMOGLOBIN A1C: 6.4 % (ref 4.6–6.5)

## 2015-04-20 ENCOUNTER — Encounter: Payer: Self-pay | Admitting: Internal Medicine

## 2015-04-20 ENCOUNTER — Ambulatory Visit (INDEPENDENT_AMBULATORY_CARE_PROVIDER_SITE_OTHER): Payer: 59 | Admitting: Internal Medicine

## 2015-04-20 VITALS — BP 120/80 | HR 72 | Temp 97.7°F | Resp 18 | Ht 60.0 in | Wt 176.2 lb

## 2015-04-20 DIAGNOSIS — K219 Gastro-esophageal reflux disease without esophagitis: Secondary | ICD-10-CM

## 2015-04-20 DIAGNOSIS — Z658 Other specified problems related to psychosocial circumstances: Secondary | ICD-10-CM

## 2015-04-20 DIAGNOSIS — E119 Type 2 diabetes mellitus without complications: Secondary | ICD-10-CM

## 2015-04-20 DIAGNOSIS — E7800 Pure hypercholesterolemia, unspecified: Secondary | ICD-10-CM

## 2015-04-20 DIAGNOSIS — I10 Essential (primary) hypertension: Secondary | ICD-10-CM

## 2015-04-20 DIAGNOSIS — E559 Vitamin D deficiency, unspecified: Secondary | ICD-10-CM | POA: Diagnosis not present

## 2015-04-20 DIAGNOSIS — F439 Reaction to severe stress, unspecified: Secondary | ICD-10-CM

## 2015-04-20 MED ORDER — PANTOPRAZOLE SODIUM 40 MG PO TBEC: 40.0000 mg | DELAYED_RELEASE_TABLET | Freq: Every day | ORAL | Status: DC

## 2015-04-20 MED ORDER — CITALOPRAM HYDROBROMIDE 10 MG PO TABS: 10.0000 mg | ORAL_TABLET | Freq: Every day | ORAL | Status: DC

## 2015-04-20 MED ORDER — NYSTATIN 100000 UNIT/GM EX CREA
1.0000 | TOPICAL_CREAM | Freq: Two times a day (BID) | CUTANEOUS | Status: DC
Start: 2015-04-20 — End: 2017-06-14

## 2015-04-20 NOTE — Progress Notes (Signed)
Pre-visit discussion using our clinic review tool. No additional management support is needed unless otherwise documented below in the visit note.  

## 2015-04-20 NOTE — Progress Notes (Signed)
Patient ID: Courtney Donovan, female   DOB: 1960/10/13, 55 y.o.   MRN: [id number]   Subjective:    Patient ID: Courtney Donovan, female    DOB: 1960-09-11, 55 y.o.   MRN: [id number]  HPI  Patient here for a scheduled follow up.  She started weight watchers three weeks ago.  Discussed diet and exercise.  Tries to stay active.  No cardiac symptoms with increased activity or exertion.  No sob.  No acid reflux.  No abdominal pain or cramping.  Bowels stable.  Increased stress.  Increased anxiety.  Work is stressful.  Takes lorazepam prn.  Discussed starting SSRI.  She is agreeable.     Past Medical History  Diagnosis Date  . Hypertension   . Hypercholesterolemia   . Chronic headaches   . Anemia   . Diabetes mellitus ([place])     diet controlled  . Allergy    Past Surgical History  Procedure Laterality Date  . Abdominal hysterectomy  2011    supracervical hysterectomy  . Tubal ligation  1996  . Tonsilectomy/adenoidectomy with myringotomy  1067   Family History  Problem Relation Age of Onset  . Hypertension Mother   . Cancer Father     lung  . Hypertension Father    Social History   Social History  . Marital Status: Married    Spouse Name: N/A  . Number of Children: 2  . Years of Education: N/A   Occupational History  .     Social History Main Topics  . Smoking status: Never Smoker   . Smokeless tobacco: Never Used  . Alcohol Use: No  . Drug Use: No  . Sexual Activity: Not Asked   Other Topics Concern  . None   Social History Narrative    Outpatient Encounter Prescriptions as of 04/20/2015  Medication Sig  . aspirin 81 MG chewable tablet Chew 81 mg by mouth daily.  . ergocalciferol (VITAMIN D2) 50000 UNITS capsule Take 1 capsule (50,000 Units total) by mouth once a week.  . fluticasone (FLONASE) 50 MCG/ACT nasal spray Place 2 sprays into both nostrils daily.  Marland Kitchen LORazepam (ATIVAN) 0.5 MG tablet Take 1/2 -1 tablet q day prn.  . Multiple  Vitamin (MULTIVITAMIN) tablet Take 1 tablet by mouth daily.  . [DISCONTINUED] atorvastatin (LIPITOR) 10 MG tablet 1/2 tablet q day  . [DISCONTINUED] omeprazole (PRILOSEC) 40 MG capsule Take 1 capsule (40 mg total) by mouth daily.  . citalopram (CELEXA) 10 MG tablet Take 1 tablet (10 mg total) by mouth daily.  Marland Kitchen nystatin cream (MYCOSTATIN) Apply 1 application topically 2 (two) times daily.  . pantoprazole (PROTONIX) 40 MG tablet Take 1 tablet (40 mg total) by mouth daily.  . [DISCONTINUED] clindamycin (CLEOCIN) 2 % vaginal cream Place 1 Applicatorful vaginally at bedtime. For 7 days  . [DISCONTINUED] fluconazole (DIFLUCAN) 150 MG tablet Take 1 tablet (150 mg total) by mouth daily.  . [DISCONTINUED] mupirocin ointment (BACTROBAN) 2 % Apply 1 application topically 2 (two) times daily. Apply to neck (Patient not taking: Reported on 04/20/2015)   No facility-administered encounter medications on file as of 04/20/2015.    Review of Systems  Constitutional: Negative for appetite change and unexpected weight change.  HENT: Negative for congestion and sinus pressure.   Respiratory: Negative for cough, chest tightness and shortness of breath.   Cardiovascular: Negative for chest pain, palpitations and leg swelling.  Gastrointestinal: Negative for nausea, abdominal pain and diarrhea.  Genitourinary: Negative for dysuria and  difficulty urinating.  Musculoskeletal: Negative for back pain and joint swelling.  Skin: Negative for color change and rash.  Neurological: Negative for dizziness, light-headedness and headaches.  Psychiatric/Behavioral: Negative for dysphoric mood and agitation.       Increased stress and anxiety.        Objective:    Physical Exam  Constitutional: She appears well-developed and well-nourished. No distress.  HENT:  Nose: Nose normal.  Mouth/Throat: Oropharynx is clear and moist.  Neck: Neck supple. No thyromegaly present.  Cardiovascular: Normal rate and regular rhythm.     Pulmonary/Chest: Breath sounds normal. No respiratory distress. She has no wheezes.  Abdominal: Soft. Bowel sounds are normal. There is no tenderness.  Musculoskeletal: She exhibits no edema or tenderness.  Lymphadenopathy:    She has no cervical adenopathy.  Skin: No rash noted. No erythema.  Psychiatric: She has a normal mood and affect. Her behavior is normal.    BP 120/80 mmHg  Pulse 72  Temp(Src) 97.7 F (36.5 C) (Oral)  Resp 18  Ht 5' (1.524 m)  Wt 176 lb 4 oz (79.946 kg)  BMI 34.42 kg/m2  SpO2 97% Wt Readings from Last 3 Encounters:  04/20/15 176 lb 4 oz (79.946 kg)  02/08/15 178 lb 8 oz (80.967 kg)  10/08/14 168 lb 4 oz (76.318 kg)     Lab Results  Component Value Date   WBC 5.2 08/03/2014   HGB 12.0 08/03/2014   HCT 36.3 08/03/2014   PLT 204.0 08/03/2014   GLUCOSE 84 04/15/2015   CHOL 220* 04/15/2015   TRIG 83.0 04/15/2015   HDL 51.90 04/15/2015   LDLDIRECT 159.5 01/16/2013   LDLCALC 151* 04/15/2015   ALT 10 04/15/2015   AST 13 04/15/2015   NA 138 04/15/2015   K 4.0 04/15/2015   CL 101 04/15/2015   CREATININE 0.83 04/15/2015   BUN 12 04/15/2015   CO2 30 04/15/2015   TSH 1.06 09/28/2014   HGBA1C 6.4 04/15/2015   MICROALBUR <0.7 04/15/2014    Mm Digital Screening Bilateral  03/31/2015  CLINICAL DATA:  Screening. EXAM: DIGITAL SCREENING BILATERAL MAMMOGRAM WITH CAD COMPARISON:  Previous exam(s). ACR Breast Density Category b: There are scattered areas of fibroglandular density. FINDINGS: There are no findings suspicious for malignancy. Images were processed with CAD. IMPRESSION: No mammographic evidence of malignancy. A result letter of this screening mammogram will be mailed directly to the patient. RECOMMENDATION: Screening mammogram in one year. (Code:SM-B-01Y) BI-RADS CATEGORY  1: Negative. Electronically Signed   By: Franki Cabot M.D.   On: 03/31/2015 08:12       Assessment & Plan:   Problem List Items Addressed This Visit    Diabetes mellitus  ([place])    Has adjusted her diet.  Follow met b and a1c.  a1c just checked 6.4.        GERD (gastroesophageal reflux disease)    EGD 08/19/14.  On omeprazole.  Needs to change to protonix.  Follow.        Relevant Medications   pantoprazole (PROTONIX) 40 MG tablet   Hypercholesterolemia    Low cholesterol diet and exercise.  Off lipitor.  Follow lipid panel.       Hypertension - Primary    Blood pressure under good control.  Continue same medication regimen.  Follow pressures.  Follow metabolic panel.        Stress    Increased stress as outlined.  Discussed with her today.  Start citalopram 26m q day.  Follow closely.  lorazeapm prn.  Follow.       Vitamin D deficiency    Vitamin D supplements.  Follow vitamin D level.            Einar Pheasant, MD

## 2015-04-25 ENCOUNTER — Encounter: Payer: Self-pay | Admitting: Internal Medicine

## 2015-04-25 DIAGNOSIS — F439 Reaction to severe stress, unspecified: Secondary | ICD-10-CM | POA: Insufficient documentation

## 2015-04-25 NOTE — Assessment & Plan Note (Signed)
EGD 08/19/14.  On omeprazole.  Needs to change to protonix.  Follow.

## 2015-04-25 NOTE — Assessment & Plan Note (Signed)
Blood pressure under good control.  Continue same medication regimen.  Follow pressures.  Follow metabolic panel.   

## 2015-04-25 NOTE — Assessment & Plan Note (Signed)
Increased stress as outlined.  Discussed with her today.  Start citalopram 10mg  q day.  Follow closely.  lorazeapm prn.  Follow.

## 2015-04-25 NOTE — Assessment & Plan Note (Addendum)
Has adjusted her diet.  Follow met b and a1c.  a1c just checked 6.4.

## 2015-04-25 NOTE — Assessment & Plan Note (Signed)
Low cholesterol diet and exercise.  Off lipitor.  Follow lipid panel.

## 2015-04-25 NOTE — Assessment & Plan Note (Signed)
Vitamin D supplements.  Follow vitamin D level.  

## 2015-05-17 ENCOUNTER — Telehealth: Payer: Self-pay | Admitting: Internal Medicine

## 2015-05-17 ENCOUNTER — Encounter (HOSPITAL_COMMUNITY): Payer: Self-pay | Admitting: Emergency Medicine

## 2015-05-17 ENCOUNTER — Ambulatory Visit (HOSPITAL_COMMUNITY)
Admission: EM | Admit: 2015-05-17 | Discharge: 2015-05-17 | Disposition: A | Discharge status: Home / Self Care | Payer: 59 | Attending: Emergency Medicine | Admitting: Emergency Medicine

## 2015-05-17 DIAGNOSIS — R51 Headache: Secondary | ICD-10-CM | POA: Diagnosis not present

## 2015-05-17 DIAGNOSIS — R519 Headache, unspecified: Secondary | ICD-10-CM

## 2015-05-17 MED ORDER — KETOROLAC TROMETHAMINE 60 MG/2ML IM SOLN
45.0000 mg | Freq: Once | INTRAMUSCULAR | Status: AC
  Administered 2015-05-17: 45 mg via INTRAMUSCULAR

## 2015-05-17 MED ORDER — KETOROLAC TROMETHAMINE 60 MG/2ML IM SOLN
INTRAMUSCULAR | Status: AC
  Filled 2015-05-17: qty 2

## 2015-05-17 MED ORDER — ACETAMINOPHEN 325 MG PO TABS
650.0000 mg | ORAL_TABLET | Freq: Once | ORAL | Status: AC
  Administered 2015-05-17: 650 mg via ORAL

## 2015-05-17 MED ORDER — ACETAMINOPHEN 325 MG PO TABS
ORAL_TABLET | ORAL | Status: AC
  Filled 2015-05-17: qty 2

## 2015-05-17 NOTE — ED Notes (Signed)
Head pain, onset today.

## 2015-05-17 NOTE — ED Notes (Signed)
Spoke with Janne Napoleon, np about notes for patient and visitor.  Agreeable to notes for today for both.

## 2015-05-17 NOTE — ED Provider Notes (Signed)
CSN: [id number]     Arrival date & time 05/17/15  1500 History   First MD Initiated Contact with Patient 05/17/15 1743     Chief Complaint  Patient presents with  . Headache   (Consider location/radiation/quality/duration/timing/severity/associated sxs/prior Treatment) HPI Comments: 55 year old female states that around 65 today she developed a sudden sharp pain across the forehead. It lasted for approximately 10 minutes before spontaneously resolving. Although the sharp pain was resolved she continued to have some soreness and minor headache across the forehead over the brow loss from temple to temple. A second episode occurred when driving to the urgent care. At no time did she have problems with vision, speech, hearing, swallowing. No GI symptoms. No photophobia. Denies focal paresthesias or weakness. She states that after having this headache that she just did not feel right in the head. She was unable to further elaborate on that. She denies confusion, disorientation, problems with memory or concentration.  Upon entering the room I noticed that she was furrowing her brows and maintaining that appearance during the H&P. She states that her husband had mentioned that to her before and this is been an ongoing condition.   Past Medical History  Diagnosis Date  . Hypertension   . Hypercholesterolemia   . Chronic headaches   . Anemia   . Diabetes mellitus ([place])     diet controlled  . Allergy    Past Surgical History  Procedure Laterality Date  . Abdominal hysterectomy  2011    supracervical hysterectomy  . Tubal ligation  1996  . Tonsilectomy/adenoidectomy with myringotomy  1067   Family History  Problem Relation Age of Onset  . Hypertension Mother   . Cancer Father     lung  . Hypertension Father    Social History  Substance Use Topics  . Smoking status: Never Smoker   . Smokeless tobacco: Never Used  . Alcohol Use: No   OB History    No data available     Review of  Systems  Constitutional: Negative for fever, activity change, appetite change and fatigue.  Eyes: Negative.  Negative for photophobia, discharge, redness and visual disturbance.  Respiratory: Negative.   Cardiovascular: Negative.   Gastrointestinal: Negative.   Genitourinary: Negative.   Musculoskeletal: Negative.   Skin: Negative.   Neurological: Positive for light-headedness and headaches. Negative for dizziness, tremors, seizures, syncope, facial asymmetry, speech difficulty, weakness and numbness.  Psychiatric/Behavioral: Negative.   All other systems reviewed and are negative.   Allergies  Metrogel and Pravastatin  Home Medications   Prior to Admission medications   Medication Sig Start Date End Date Taking? Authorizing Provider  aspirin 81 MG chewable tablet Chew 81 mg by mouth daily.    Historical Provider, MD  citalopram (CELEXA) 10 MG tablet Take 1 tablet (10 mg total) by mouth daily. 04/20/15   Einar Pheasant, MD  ergocalciferol (VITAMIN D2) 50000 UNITS capsule Take 1 capsule (50,000 Units total) by mouth once a week. 04/17/14   Einar Pheasant, MD  fluticasone (FLONASE) 50 MCG/ACT nasal spray Place 2 sprays into both nostrils daily. 02/08/15   Einar Pheasant, MD  LORazepam (ATIVAN) 0.5 MG tablet Take 1/2 -1 tablet q day prn. 10/08/14   Einar Pheasant, MD  Multiple Vitamin (MULTIVITAMIN) tablet Take 1 tablet by mouth daily.    Historical Provider, MD  nystatin cream (MYCOSTATIN) Apply 1 application topically 2 (two) times daily. 04/20/15   Einar Pheasant, MD  pantoprazole (PROTONIX) 40 MG tablet Take 1 tablet (40 mg  total) by mouth daily. 04/20/15   Einar Pheasant, MD   Meds Ordered and Administered this Visit   Medications  ketorolac (TORADOL) injection 45 mg (45 mg Intramuscular Given 05/17/15 1830)  acetaminophen (TYLENOL) tablet 650 mg (650 mg Oral Given 05/17/15 1830)    BP 130/81 mmHg  Pulse 66  Temp(Src) 97.6 F (36.4 C) (Oral)  Resp 16  SpO2 100% No data  found.   Physical Exam  Constitutional: She is oriented to person, place, and time. She appears well-developed and well-nourished. No distress.  HENT:  Head: Normocephalic and atraumatic.  Mouth/Throat: Oropharynx is clear and moist. No oropharyngeal exudate.  Oropharynx is clear. Tongue and uvula midline. Soft palate rises symmetrically. Normal control of tongue position. Swallowing reflex intact.  Tender to palpation across the lower forehead, brows and temples.  Eyes: Conjunctivae and EOM are normal. Pupils are equal, round, and reactive to light. Right eye exhibits no discharge. Left eye exhibits no discharge.  No nystagmus.  Neck: Normal range of motion. Neck supple.  No posterior neck musculature tenderness or pain. No spinal tenderness, deformity, swelling.  Cardiovascular: Normal rate, regular rhythm, normal heart sounds and intact distal pulses.   No murmur heard. Pulmonary/Chest: Effort normal and breath sounds normal. No respiratory distress. She has no wheezes.  Musculoskeletal: Normal range of motion. She exhibits no edema or tenderness.  Lymphadenopathy:    She has no cervical adenopathy.  Neurological: She is alert and oriented to person, place, and time. She has normal strength. No cranial nerve deficit or sensory deficit. She exhibits normal muscle tone. She displays a negative Romberg sign. Coordination and gait normal. GCS eye subscore is 4. GCS verbal subscore is 5. GCS motor subscore is 6.  Heel to toe normal. Coordination normal. Speech perfectly clear.  Skin: Skin is warm and dry.  Psychiatric: She has a normal mood and affect. Her behavior is normal. Judgment and thought content normal.  Nursing note and vitals reviewed.   ED Course  Procedures (including critical care time)  Labs Review Labs Reviewed - No data to display  Imaging Review No results found.   Visual Acuity Review  Right Eye Distance:   Left Eye Distance:   Bilateral Distance:     Right Eye Near:   Left Eye Near:    Bilateral Near:         MDM   1. Headache above the eye region    Meds ordered this encounter  Medications  . ketorolac (TORADOL) injection 45 mg    Sig:   . acetaminophen (TYLENOL) tablet 650 mg    Sig:    May take the OTC meds for H/A as you have been Keep appt with your PCP For worsening , red flags as discussed go to the ED Pt st feeling well now. Asymptomatic, Danley Danker, NP 05/17/15 1851

## 2015-05-17 NOTE — Telephone Encounter (Signed)
Pt would like for someone in clinical to give her a call re guarding  her medical history.. Pt would not give me any other information.. Please advise pt.Marland Kitchen

## 2015-05-17 NOTE — Telephone Encounter (Signed)
Spoke with the patient , she was having sharp pains in her head so they advised her to go the ED.  ?She was concerned with inusrance coverage and referrals from Dr, Brigitte Pulse, so she went to [facility].  Patient is there now and will follow up after if she needs anything. thanks

## 2015-05-27 ENCOUNTER — Ambulatory Visit: Payer: 59 | Admitting: Family Medicine

## 2015-07-15 ENCOUNTER — Encounter: Payer: Self-pay | Admitting: Internal Medicine

## 2015-07-15 ENCOUNTER — Ambulatory Visit (INDEPENDENT_AMBULATORY_CARE_PROVIDER_SITE_OTHER): Payer: 59 | Admitting: Internal Medicine

## 2015-07-15 VITALS — BP 100/70 | HR 71 | Temp 97.7°F | Resp 18 | Ht 60.0 in | Wt 181.0 lb

## 2015-07-15 DIAGNOSIS — E7800 Pure hypercholesterolemia, unspecified: Secondary | ICD-10-CM

## 2015-07-15 DIAGNOSIS — K219 Gastro-esophageal reflux disease without esophagitis: Secondary | ICD-10-CM

## 2015-07-15 DIAGNOSIS — E119 Type 2 diabetes mellitus without complications: Secondary | ICD-10-CM

## 2015-07-15 DIAGNOSIS — F439 Reaction to severe stress, unspecified: Secondary | ICD-10-CM

## 2015-07-15 DIAGNOSIS — E559 Vitamin D deficiency, unspecified: Secondary | ICD-10-CM

## 2015-07-15 DIAGNOSIS — I10 Essential (primary) hypertension: Secondary | ICD-10-CM

## 2015-07-15 DIAGNOSIS — Z658 Other specified problems related to psychosocial circumstances: Secondary | ICD-10-CM

## 2015-07-15 NOTE — Progress Notes (Signed)
Patient ID: Courtney Donovan, female   DOB: 03-08-1960, 55 y.o.   MRN: [id number]   Subjective:    Patient ID: Courtney Donovan, female    DOB: [birth year], 55 y.o.   MRN: [id number]  HPI  Patient here for a scheduled follow up.  States she is doing well.  We discussed diet and exercise.  She plans to get back into doing more exercise.  No chest pain.  No sob.  No acid reflux.  No abdominal pain or cramping.  Bowels stable. Handling stress. Working overtime.     Past Medical History  Diagnosis Date  . Hypertension   . Hypercholesterolemia   . Chronic headaches   . Anemia   . Diabetes mellitus ([place])     diet controlled  . Allergy    Past Surgical History  Procedure Laterality Date  . Abdominal hysterectomy  2011    supracervical hysterectomy  . Tubal ligation  1996  . Tonsilectomy/adenoidectomy with myringotomy  1067   Family History  Problem Relation Age of Onset  . Hypertension Mother   . Cancer Father     lung  . Hypertension Father    Social History   Social History  . Marital Status: Married    Spouse Name: N/A  . Number of Children: 2  . Years of Education: N/A   Occupational History  .     Social History Main Topics  . Smoking status: Never Smoker   . Smokeless tobacco: Never Used  . Alcohol Use: No  . Drug Use: No  . Sexual Activity: Not Asked   Other Topics Concern  . None   Social History Narrative    Outpatient Encounter Prescriptions as of 07/15/2015  Medication Sig  . aspirin 81 MG chewable tablet Chew 81 mg by mouth daily.  . citalopram (CELEXA) 10 MG tablet Take 1 tablet (10 mg total) by mouth daily.  . ergocalciferol (VITAMIN D2) 50000 UNITS capsule Take 1 capsule (50,000 Units total) by mouth once a week.  . fluticasone (FLONASE) 50 MCG/ACT nasal spray Place 2 sprays into both nostrils daily.  Marland Kitchen LORazepam (ATIVAN) 0.5 MG tablet Take 1/2 -1 tablet q day prn.  . Multiple Vitamin (MULTIVITAMIN) tablet Take 1 tablet by  mouth daily.  Marland Kitchen nystatin cream (MYCOSTATIN) Apply 1 application topically 2 (two) times daily.  . pantoprazole (PROTONIX) 40 MG tablet Take 1 tablet (40 mg total) by mouth daily.   No facility-administered encounter medications on file as of 07/15/2015.    Review of Systems  Constitutional: Negative for appetite change and unexpected weight change.  HENT: Negative for congestion and sinus pressure.   Respiratory: Negative for cough, chest tightness and shortness of breath.   Cardiovascular: Negative for chest pain, palpitations and leg swelling.  Gastrointestinal: Negative for nausea, vomiting, abdominal pain and diarrhea.  Genitourinary: Negative for dysuria and difficulty urinating.  Musculoskeletal: Negative for back pain and joint swelling.  Skin: Negative for color change and rash.  Neurological: Negative for dizziness, light-headedness and headaches.  Psychiatric/Behavioral: Negative for dysphoric mood and agitation.       Objective:     Blood pressure rechecked by me:  120/78-80  Physical Exam  Constitutional: She appears well-developed and well-nourished. No distress.  HENT:  Nose: Nose normal.  Mouth/Throat: Oropharynx is clear and moist.  Neck: Neck supple. No thyromegaly present.  Cardiovascular: Normal rate and regular rhythm.   Pulmonary/Chest: Breath sounds normal. No respiratory distress. She has no wheezes.  Abdominal:  Soft. Bowel sounds are normal. There is no tenderness.  Musculoskeletal: She exhibits no edema or tenderness.  Lymphadenopathy:    She has no cervical adenopathy.  Skin: No rash noted. No erythema.  Psychiatric: She has a normal mood and affect. Her behavior is normal.    BP 100/70 mmHg  Pulse 71  Temp(Src) 97.7 F (36.5 C) (Oral)  Resp 18  Ht 5' (1.524 m)  Wt 181 lb (82.101 kg)  BMI 35.35 kg/m2  SpO2 99% Wt Readings from Last 3 Encounters:  07/15/15 181 lb (82.101 kg)  04/20/15 176 lb 4 oz (79.946 kg)  02/08/15 178 lb 8 oz (80.967 kg)      Lab Results  Component Value Date   WBC 5.2 08/03/2014   HGB 12.0 08/03/2014   HCT 36.3 08/03/2014   PLT 204.0 08/03/2014   GLUCOSE 84 04/15/2015   CHOL 220* 04/15/2015   TRIG 83.0 04/15/2015   HDL 51.90 04/15/2015   LDLDIRECT 159.5 01/16/2013   LDLCALC 151* 04/15/2015   ALT 10 04/15/2015   AST 13 04/15/2015   NA 138 04/15/2015   K 4.0 04/15/2015   CL 101 04/15/2015   CREATININE 0.83 04/15/2015   BUN 12 04/15/2015   CO2 30 04/15/2015   TSH 1.06 09/28/2014   HGBA1C 6.4 04/15/2015   MICROALBUR <0.7 04/15/2014       Assessment & Plan:   Problem List Items Addressed This Visit    Diabetes mellitus ([place])    Low carb diet and exercise.  Follow met b and a1c.       Relevant Orders   Hemoglobin K3T   Basic metabolic panel   Microalbumin / creatinine urine ratio   GERD (gastroesophageal reflux disease)    On protonix.  Upper symptoms controlled.        Hypercholesterolemia    Low cholesterol diet and exercise.  Follow lipid panel.        Relevant Orders   Lipid panel   Hepatic function panel   Hypertension - Primary    Blood pressure under good control.  Continue same medication regimen.  Follow pressures.  Follow metabolic panel.        Relevant Orders   TSH   CBC with Differential/Platelet   Stress    On citalopram.   Doing well.  Handling stress.  Follow.        Vitamin D deficiency    Vitamin D supplements.  Follow vitamin D level.            Einar Pheasant, MD

## 2015-07-15 NOTE — Progress Notes (Signed)
Pre-visit discussion using our clinic review tool. No additional management support is needed unless otherwise documented below in the visit note.  

## 2015-07-18 ENCOUNTER — Encounter: Payer: Self-pay | Admitting: Internal Medicine

## 2015-07-18 NOTE — Assessment & Plan Note (Signed)
Low carb diet and exercise.  Follow met b and a1c.  

## 2015-07-18 NOTE — Assessment & Plan Note (Signed)
On citalopram.   Doing well.  Handling stress.  Follow.

## 2015-07-18 NOTE — Assessment & Plan Note (Signed)
On protonix.  Upper symptoms controlled.  

## 2015-07-18 NOTE — Assessment & Plan Note (Signed)
Vitamin D supplements.  Follow vitamin D level.  

## 2015-07-18 NOTE — Assessment & Plan Note (Signed)
Blood pressure under good control.  Continue same medication regimen.  Follow pressures.  Follow metabolic panel.   

## 2015-07-18 NOTE — Assessment & Plan Note (Signed)
Low cholesterol diet and exercise.  Follow lipid panel.   

## 2015-07-30 ENCOUNTER — Telehealth: Payer: Self-pay | Admitting: Internal Medicine

## 2015-07-30 NOTE — Telephone Encounter (Signed)
Pt called about wanting to get capsules instead pills. Medication name is Omeprazole 40 mg. Instead of pantoprazole (PROTONIX) 40 MG tablet. Pt says it's not working.   Pt also would like a refill on her ergocalciferol (VITAMIN D2) 50000 UNITS capsule.  Pharmacy is [facility], [place].  Call pt @ [phone]. Thank you!

## 2015-07-30 NOTE — Telephone Encounter (Signed)
Spoke to patient advised  Dr. Nicki Reaper not in office. Will fwd request. Patient agreed.

## 2015-08-01 NOTE — Telephone Encounter (Signed)
Would recommend holding on refill of vitamin D at this time.  Needs a vitamin D level rechecked and see where her level is before refilling.  Can schedule a non fasting lab appt.  She is on citalopram.  There is an interaction between prilosec (omeprazole) and citalopram.  Would avoid.  If having persistent symptoms on protonix, can increase dose if needed.

## 2015-08-02 ENCOUNTER — Other Ambulatory Visit: Payer: Self-pay | Admitting: Internal Medicine

## 2015-08-02 MED ORDER — OMEPRAZOLE 40 MG PO CPDR: 40.0000 mg | DELAYED_RELEASE_CAPSULE | Freq: Every day | ORAL | 2 refills | Status: DC

## 2015-08-02 NOTE — Telephone Encounter (Signed)
Spoke with the patient, reviewed the note from the provider, she is agreeable.  She will let us know how the capsule work.  If they don't she is agreeable to referral if needed. thanks

## 2015-08-02 NOTE — Telephone Encounter (Signed)
Since she is not taking the citalopram, I will send in prescription for the 40mg  omeprazole.  She is to update me on her symptoms.  Also, if she is having increased symptoms despite medication, needs GI referral.  Let me know if agreeable and call with update.

## 2015-08-02 NOTE — Telephone Encounter (Signed)
Only drinking one cup of coffee a day, but is having increased reflex.  She stopped taking the celexa as she thought it might be causing the reflex.  She is taking just the baby aspirin and then the protonix once a day and having numerous breakthrough moments of reflex in the middle of the day and night.  She believes that hte capsule works better for her as it was time released.  Please advise.  She understood with the Vitamin D and will schedule labs.  thanks

## 2015-08-10 ENCOUNTER — Other Ambulatory Visit (INDEPENDENT_AMBULATORY_CARE_PROVIDER_SITE_OTHER): Payer: 59

## 2015-08-10 DIAGNOSIS — I10 Essential (primary) hypertension: Secondary | ICD-10-CM

## 2015-08-10 DIAGNOSIS — E119 Type 2 diabetes mellitus without complications: Secondary | ICD-10-CM | POA: Diagnosis not present

## 2015-08-10 DIAGNOSIS — E7800 Pure hypercholesterolemia, unspecified: Secondary | ICD-10-CM | POA: Diagnosis not present

## 2015-08-10 LAB — BASIC METABOLIC PANEL
BUN: 13 mg/dL (ref 6–23)
CALCIUM: 9.5 mg/dL (ref 8.4–10.5)
CO2: 30 mEq/L (ref 19–32)
Chloride: 102 mEq/L (ref 96–112)
Creatinine, Ser: 0.88 mg/dL (ref 0.40–1.20)
GFR: 86.41 mL/min (ref >60.00)
Glucose, Bld: 118 mg/dL — ABNORMAL HIGH (ref 70–99)
POTASSIUM: 4.6 meq/L (ref 3.5–5.1)
SODIUM: 139 meq/L (ref 135–145)

## 2015-08-10 LAB — CBC WITH DIFFERENTIAL/PLATELET
BASOS ABS: 0 10*3/uL (ref 0.0–0.1)
Basophils Relative: 0.4 % (ref 0.0–3.0)
EOS ABS: 0.1 10*3/uL (ref 0.0–0.7)
Eosinophils Relative: 1.2 % (ref 0.0–5.0)
HEMATOCRIT: 37.4 % (ref 36.0–46.0)
Hemoglobin: 12.5 g/dL (ref 12.0–15.0)
LYMPHS ABS: 2.3 10*3/uL (ref 0.7–4.0)
Lymphocytes Relative: 35.6 % (ref 12.0–46.0)
MCHC: 33.4 g/dL (ref 30.0–36.0)
MCV: 85.9 fl (ref 78.0–100.0)
MONOS PCT: 6.4 % (ref 3.0–12.0)
Monocytes Absolute: 0.4 10*3/uL (ref 0.1–1.0)
NEUTROS ABS: 3.7 10*3/uL (ref 1.4–7.7)
Neutrophils Relative %: 56.4 % (ref 43.0–77.0)
PLATELETS: 234 10*3/uL (ref 150.0–400.0)
RBC: 4.35 Mil/uL (ref 3.87–5.11)
RDW: 13.9 % (ref 11.5–15.5)
WBC: 6.5 10*3/uL (ref 4.0–10.5)

## 2015-08-10 LAB — LIPID PANEL
CHOL/HDL RATIO: 5
Cholesterol: 249 mg/dL — ABNORMAL HIGH (ref 0–200)
HDL: 49.4 mg/dL (ref >39.00)
LDL Cholesterol: 171 mg/dL — ABNORMAL HIGH (ref 0–99)
NONHDL: 199.34
TRIGLYCERIDES: 141 mg/dL (ref 0.0–149.0)
VLDL: 28.2 mg/dL (ref 0.0–40.0)

## 2015-08-10 LAB — TSH: TSH: 1.35 u[IU]/mL (ref 0.35–4.50)

## 2015-08-10 LAB — HEPATIC FUNCTION PANEL
ALBUMIN: 4.3 g/dL (ref 3.5–5.2)
ALK PHOS: 74 U/L (ref 39–117)
ALT: 13 U/L (ref 0–35)
AST: 13 U/L (ref 0–37)
Bilirubin, Direct: 0.1 mg/dL (ref 0.0–0.3)
TOTAL PROTEIN: 7.5 g/dL (ref 6.0–8.3)
Total Bilirubin: 0.4 mg/dL (ref 0.2–1.2)

## 2015-08-10 LAB — HEMOGLOBIN A1C: Hgb A1c MFr Bld: 6.4 % (ref 4.6–6.5)

## 2015-08-10 LAB — MICROALBUMIN / CREATININE URINE RATIO
Creatinine,U: 204.3 mg/dL
MICROALB UR: 0.9 mg/dL (ref 0.0–1.9)
MICROALB/CREAT RATIO: 0.4 mg/g (ref 0.0–30.0)

## 2015-08-11 ENCOUNTER — Other Ambulatory Visit: Payer: Self-pay | Admitting: *Deleted

## 2015-08-11 MED ORDER — ROSUVASTATIN CALCIUM 5 MG PO TABS: 5.0000 mg | ORAL_TABLET | Freq: Every day | ORAL | 2 refills | Status: DC

## 2015-08-11 NOTE — Telephone Encounter (Signed)
Rx sent to pharmacy-pt agreed to start on Crestor 5mg  daily.

## 2015-09-27 ENCOUNTER — Other Ambulatory Visit (INDEPENDENT_AMBULATORY_CARE_PROVIDER_SITE_OTHER): Payer: 59

## 2015-09-27 DIAGNOSIS — E7800 Pure hypercholesterolemia, unspecified: Secondary | ICD-10-CM | POA: Diagnosis not present

## 2015-09-27 LAB — HEPATIC FUNCTION PANEL
ALBUMIN: 4.1 g/dL (ref 3.5–5.2)
ALT: 13 U/L (ref 0–35)
AST: 15 U/L (ref 0–37)
Alkaline Phosphatase: 76 U/L (ref 39–117)
BILIRUBIN TOTAL: 0.4 mg/dL (ref 0.2–1.2)
Bilirubin, Direct: 0.1 mg/dL (ref 0.0–0.3)
Total Protein: 6.9 g/dL (ref 6.0–8.3)

## 2015-09-28 ENCOUNTER — Encounter: Payer: Self-pay | Admitting: Internal Medicine

## 2015-10-21 ENCOUNTER — Encounter: Payer: Self-pay | Admitting: Internal Medicine

## 2015-10-21 ENCOUNTER — Ambulatory Visit (INDEPENDENT_AMBULATORY_CARE_PROVIDER_SITE_OTHER): Payer: 59 | Admitting: Internal Medicine

## 2015-10-21 DIAGNOSIS — E7800 Pure hypercholesterolemia, unspecified: Secondary | ICD-10-CM | POA: Diagnosis not present

## 2015-10-21 DIAGNOSIS — K219 Gastro-esophageal reflux disease without esophagitis: Secondary | ICD-10-CM

## 2015-10-21 DIAGNOSIS — E559 Vitamin D deficiency, unspecified: Secondary | ICD-10-CM | POA: Diagnosis not present

## 2015-10-21 DIAGNOSIS — I10 Essential (primary) hypertension: Secondary | ICD-10-CM

## 2015-10-21 DIAGNOSIS — F439 Reaction to severe stress, unspecified: Secondary | ICD-10-CM

## 2015-10-21 DIAGNOSIS — E119 Type 2 diabetes mellitus without complications: Secondary | ICD-10-CM

## 2015-10-21 DIAGNOSIS — D649 Anemia, unspecified: Secondary | ICD-10-CM

## 2015-10-21 NOTE — Progress Notes (Signed)
Pre visit review using our clinic review tool, if applicable. No additional management support is needed unless otherwise documented below in the visit note. 

## 2015-10-21 NOTE — Progress Notes (Signed)
Patient ID: Courtney Donovan, female   DOB: 17-Jun-1960, 55 y.o.   MRN: [id number]   Subjective:    Patient ID: Courtney Donovan, female    DOB: 1960/08/05, 55 y.o.   MRN: [id number]  HPI  Patient here for a scheduled follow up.  She is doing better.  Sleeping better.  Still with increased stress - with her job.  Overall she feels she is handling things relatively well.  No chest pain.  No sob.  No acid reflux.  No abdominal pain or cramping.  Discussed diet and exercise.     Past Medical History:  Diagnosis Date  . Allergy   . Anemia   . Chronic headaches   . Diabetes mellitus ([place])    diet controlled  . Hypercholesterolemia   . Hypertension    Past Surgical History:  Procedure Laterality Date  . ABDOMINAL HYSTERECTOMY  2011   supracervical hysterectomy  . TONSILECTOMY/ADENOIDECTOMY WITH MYRINGOTOMY  1067  . TUBAL LIGATION  1996   Family History  Problem Relation Age of Onset  . Hypertension Mother   . Cancer Father     lung  . Hypertension Father    Social History   Social History  . Marital status: Married    Spouse name: N/A  . Number of children: 2  . Years of education: N/A   Occupational History  .  [employer]   Social History Main Topics  . Smoking status: Never Smoker  . Smokeless tobacco: Never Used  . Alcohol use No  . Drug use: No  . Sexual activity: Not Asked   Other Topics Concern  . None   Social History Narrative  . None    Outpatient Encounter Prescriptions as of 10/21/2015  Medication Sig  . aspirin 81 MG chewable tablet Chew 81 mg by mouth daily.  . ergocalciferol (VITAMIN D2) 50000 UNITS capsule Take 1 capsule (50,000 Units total) by mouth once a week.  . fluticasone (FLONASE) 50 MCG/ACT nasal spray Place 2 sprays into both nostrils daily.  Marland Kitchen LORazepam (ATIVAN) 0.5 MG tablet Take 1/2 -1 tablet q day prn.  . Multiple Vitamin (MULTIVITAMIN) tablet Take 1 tablet by mouth daily.  Marland Kitchen nystatin cream (MYCOSTATIN) Apply 1  application topically 2 (two) times daily.  Marland Kitchen omeprazole (PRILOSEC) 40 MG capsule Take 1 capsule (40 mg total) by mouth daily.  . rosuvastatin (CRESTOR) 5 MG tablet Take 1 tablet (5 mg total) by mouth daily.   No facility-administered encounter medications on file as of 10/21/2015.     Review of Systems  Constitutional: Negative for appetite change and unexpected weight change.  HENT: Negative for congestion and sinus pressure.   Respiratory: Negative for cough, chest tightness and shortness of breath.   Cardiovascular: Negative for chest pain, palpitations and leg swelling.  Gastrointestinal: Negative for abdominal pain, diarrhea, nausea and vomiting.  Genitourinary: Negative for difficulty urinating and dysuria.  Musculoskeletal: Negative for back pain and joint swelling.  Skin: Negative for color change and rash.  Neurological: Negative for dizziness, light-headedness and headaches.  Psychiatric/Behavioral: Negative for agitation and dysphoric mood.       Increased stress as outlined.        Objective:     Blood pressure rechecked by me:  132/82  Physical Exam  Constitutional: She appears well-developed and well-nourished. No distress.  HENT:  Nose: Nose normal.  Mouth/Throat: Oropharynx is clear and moist.  Neck: Neck supple. No thyromegaly present.  Cardiovascular: Normal rate and regular rhythm.  Pulmonary/Chest: Breath sounds normal. No respiratory distress. She has no wheezes.  Abdominal: Soft. Bowel sounds are normal. There is no tenderness.  Musculoskeletal: She exhibits no edema or tenderness.  Lymphadenopathy:    She has no cervical adenopathy.  Skin: No rash noted. No erythema.  Psychiatric: She has a normal mood and affect. Her behavior is normal.    BP 110/78   Pulse 79   Temp 98.2 F (36.8 C) (Oral)   Ht 5' (1.524 m)   Wt 183 lb 9.6 oz (83.3 kg)   SpO2 96%   BMI 35.86 kg/m  Wt Readings from Last 3 Encounters:  10/21/15 183 lb 9.6 oz (83.3 kg)    07/15/15 181 lb (82.1 kg)  04/20/15 176 lb 4 oz (79.9 kg)     Lab Results  Component Value Date   WBC 6.5 08/10/2015   HGB 12.5 08/10/2015   HCT 37.4 08/10/2015   PLT 234.0 08/10/2015   GLUCOSE 118 (H) 08/10/2015   CHOL 249 (H) 08/10/2015   TRIG 141.0 08/10/2015   HDL 49.40 08/10/2015   LDLDIRECT 159.5 01/16/2013   LDLCALC 171 (H) 08/10/2015   ALT 13 09/27/2015   AST 15 09/27/2015   NA 139 08/10/2015   K 4.6 08/10/2015   CL 102 08/10/2015   CREATININE 0.88 08/10/2015   BUN 13 08/10/2015   CO2 30 08/10/2015   TSH 1.35 08/10/2015   HGBA1C 6.4 08/10/2015   MICROALBUR 0.9 08/10/2015       Assessment & Plan:   Problem List Items Addressed This Visit    Anemia    Follow cbc.        Diabetes mellitus ([place])    Low carb diet and exercise.  Follow met b and a1c.        Relevant Orders   Hemoglobin A1c   GERD (gastroesophageal reflux disease)    On prilosec.  No upper symptoms.        Hypercholesterolemia    On crestor.  Low cholesterol diet and exercise.  Follow lipid panel and liver function tests.        Relevant Orders   Hepatic function panel   Lipid panel   Hypertension    Blood pressure under good control.  Continue same medication regimen.  Follow pressures.  Follow metabolic panel.        Relevant Orders   Basic metabolic panel   Stress    Increased stress as outlined.  Discussed ways to reduce stress.  She does not feel needs anything more at this time.  Follow.        Vitamin D deficiency    Vitamin d supplements.  Follow vitamin d level.       Relevant Orders   VITAMIN D 25 Hydroxy (Vit-D Deficiency, Fractures)    Other Visit Diagnoses   None.      Einar Pheasant, MD

## 2015-10-24 ENCOUNTER — Encounter: Payer: Self-pay | Admitting: Internal Medicine

## 2015-10-24 NOTE — Assessment & Plan Note (Signed)
Vitamin d supplements.  Follow vitamin d level.

## 2015-10-24 NOTE — Assessment & Plan Note (Signed)
Increased stress as outlined.  Discussed ways to reduce stress.  She does not feel needs anything more at this time.  Follow.

## 2015-10-24 NOTE — Assessment & Plan Note (Signed)
On crestor.  Low cholesterol diet and exercise.  Follow lipid panel and liver function tests.   

## 2015-10-24 NOTE — Assessment & Plan Note (Signed)
Low carb diet and exercise.  Follow met b and a1c.   

## 2015-10-24 NOTE — Assessment & Plan Note (Signed)
Follow cbc.  

## 2015-10-24 NOTE — Assessment & Plan Note (Signed)
Blood pressure under good control.  Continue same medication regimen.  Follow pressures.  Follow metabolic panel.   

## 2015-10-24 NOTE — Assessment & Plan Note (Signed)
On prilosec.  No upper symptoms.  

## 2015-11-14 ENCOUNTER — Encounter (HOSPITAL_COMMUNITY): Payer: Self-pay

## 2015-11-14 ENCOUNTER — Ambulatory Visit (HOSPITAL_COMMUNITY)
Admission: EM | Admit: 2015-11-14 | Discharge: 2015-11-14 | Disposition: A | Discharge status: Home / Self Care | Payer: 59 | Attending: Emergency Medicine | Admitting: Emergency Medicine

## 2015-11-14 DIAGNOSIS — G44209 Tension-type headache, unspecified, not intractable: Secondary | ICD-10-CM | POA: Diagnosis not present

## 2015-11-14 MED ORDER — DEXAMETHASONE SODIUM PHOSPHATE 10 MG/ML IJ SOLN
INTRAMUSCULAR | Status: AC
  Filled 2015-11-14: qty 1

## 2015-11-14 MED ORDER — CYCLOBENZAPRINE HCL 5 MG PO TABS: 5.0000 mg | ORAL_TABLET | Freq: Every day | ORAL | 0 refills | Status: DC

## 2015-11-14 MED ORDER — KETOROLAC TROMETHAMINE 60 MG/2ML IM SOLN
60.0000 mg | Freq: Once | INTRAMUSCULAR | Status: AC
  Administered 2015-11-14: 60 mg via INTRAMUSCULAR

## 2015-11-14 MED ORDER — KETOROLAC TROMETHAMINE 60 MG/2ML IM SOLN
INTRAMUSCULAR | Status: AC
  Filled 2015-11-14: qty 2

## 2015-11-14 MED ORDER — DEXAMETHASONE SODIUM PHOSPHATE 10 MG/ML IJ SOLN
10.0000 mg | Freq: Once | INTRAMUSCULAR | Status: AC
  Administered 2015-11-14: 10 mg via INTRAMUSCULAR

## 2015-11-14 NOTE — ED Triage Notes (Signed)
Pt stated she has a headache that started 3 or 4 days ago and said it goes away with tylenol, Excedrin and motrin and it goes away and come back. Has even tried naproxen but got no relief. No nausea, vomiting or stiff neck. Currently experiencing a dull ache/ "heaviness"

## 2015-11-14 NOTE — ED Provider Notes (Signed)
[facility]    CSN: [id number] Arrival date & time: 11/14/15  1452     History   Chief Complaint Chief Complaint  Patient presents with  . Headache    HPI Courtney Donovan is a 55 y.o. female.   HPI  She is a 55 year old woman here for evaluation of headache. She states for the last 3 days she has had a headache. It is mostly in the right frontal and temporal area, less so on the left. She describes it as a heaviness or pressure sensation. No vision changes, sensitivity to light, nausea. No focal neurologic complaints. She does report increased muscle tension in her neck and shoulders. Headache is often present on awakening. She states this feels somewhat similar to prior migraines, but she typically has nausea with her migraines.  She has tried Naprosyn without improvement. The headache did resolve with Tylenol and Excedrin Migraine, but recurred.  Past Medical History:  Diagnosis Date  . Allergy   . Anemia   . Chronic headaches   . Diabetes mellitus ([place])    diet controlled  . Hypercholesterolemia   . Hypertension     Patient Active Problem List   Diagnosis Date Noted  . Stress 04/25/2015  . Vaginal irritation 02/15/2015  . Health care maintenance 10/12/2014  . Fatigue 10/12/2014  . Diarrhea 08/16/2014  . Vitamin D deficiency 04/05/2013  . Rash of neck 04/05/2013  . Headache(784.0) 01/16/2013  . Menopausal syndrome 10/28/2012  . Postnasal drip 07/01/2012  . Rectal bleeding 07/01/2012  . GERD (gastroesophageal reflux disease) 12/09/2011  . Diabetes mellitus ([place]) 12/09/2011  . Hypertension 12/04/2011  . Anemia 12/04/2011  . Hypercholesterolemia 12/04/2011    Past Surgical History:  Procedure Laterality Date  . ABDOMINAL HYSTERECTOMY  2011   supracervical hysterectomy  . TONSILECTOMY/ADENOIDECTOMY WITH MYRINGOTOMY  1067  . TUBAL LIGATION  1996    OB History    No data available       Home Medications    Prior to Admission  medications   Medication Sig Start Date End Date Taking? Authorizing Provider  aspirin 81 MG chewable tablet Chew 81 mg by mouth daily.   Yes Historical Provider, MD  fluticasone (FLONASE) 50 MCG/ACT nasal spray Place 2 sprays into both nostrils daily. 02/08/15  Yes Einar Pheasant, MD  LORazepam (ATIVAN) 0.5 MG tablet Take 1/2 -1 tablet q day prn. 10/08/14  Yes Einar Pheasant, MD  Multiple Vitamin (MULTIVITAMIN) tablet Take 1 tablet by mouth daily.   Yes Historical Provider, MD  nystatin cream (MYCOSTATIN) Apply 1 application topically 2 (two) times daily. 04/20/15  Yes Einar Pheasant, MD  omeprazole (PRILOSEC) 40 MG capsule Take 1 capsule (40 mg total) by mouth daily. 08/02/15  Yes Einar Pheasant, MD  rosuvastatin (CRESTOR) 5 MG tablet Take 1 tablet (5 mg total) by mouth daily. 08/11/15  Yes Einar Pheasant, MD  cyclobenzaprine (FLEXERIL) 5 MG tablet Take 1 tablet (5 mg total) by mouth at bedtime. 11/14/15   Melony Overly, MD  ergocalciferol (VITAMIN D2) 50000 UNITS capsule Take 1 capsule (50,000 Units total) by mouth once a week. 04/17/14   Einar Pheasant, MD    Family History Family History  Problem Relation Age of Onset  . Hypertension Mother   . Cancer Father     lung  . Hypertension Father     Social History Social History  Substance Use Topics  . Smoking status: Never Smoker  . Smokeless tobacco: Never Used  . Alcohol use No  Allergies   Metrogel [metronidazole] and Pravastatin   Review of Systems Review of Systems As in history of present illness  Physical Exam Triage Vital Signs ED Triage Vitals  Enc Vitals Group     BP 11/14/15 1600 132/84     Pulse Rate 11/14/15 1600 67     Resp 11/14/15 1600 16     Temp 11/14/15 1600 98.2 F (36.8 C)     Temp Source 11/14/15 1600 Oral     SpO2 11/14/15 1600 100 %     Weight 11/14/15 1601 183 lb (83 kg)     Height 11/14/15 1601 4\' 11"  (1.499 m)     Head Circumference --      Peak Flow --      Pain Score 11/14/15 1602 2      Pain Loc --      Pain Edu? --      Excl. in [place]? --    No data found.   Updated Vital Signs BP 132/84 (BP Location: Right Arm)   Pulse 67   Temp 98.2 F (36.8 C) (Oral)   Resp 16   Ht 4\' 11"  (1.499 m)   Wt 183 lb (83 kg)   SpO2 100%   BMI 36.96 kg/m   Visual Acuity Right Eye Distance:   Left Eye Distance:   Bilateral Distance:    Right Eye Near:   Left Eye Near:    Bilateral Near:     Physical Exam  Constitutional: She is oriented to person, place, and time. She appears well-developed and well-nourished. No distress.  Eyes: EOM are normal. Pupils are equal, round, and reactive to light.  Neck: Neck supple.  Spasm and tenderness in bilateral trapezii  Cardiovascular: Normal rate.   Pulmonary/Chest: Effort normal.  Neurological: She is alert and oriented to person, place, and time. No cranial nerve deficit or sensory deficit. She exhibits normal muscle tone. Coordination normal.     UC Treatments / Results  Labs (all labs ordered are listed, but only abnormal results are displayed) Labs Reviewed - No data to display  EKG  EKG Interpretation None       Radiology No results found.  Procedures Procedures (including critical care time)  Medications Ordered in UC Medications  ketorolac (TORADOL) injection 60 mg (60 mg Intramuscular Given 11/14/15 1637)  dexamethasone (DECADRON) injection 10 mg (10 mg Intramuscular Given 11/14/15 1637)     Initial Impression / Assessment and Plan / UC Course  I have reviewed the triage vital signs and the nursing notes.  Pertinent labs & imaging results that were available during my care of the patient were reviewed by me and considered in my medical decision making (see chart for details).  Clinical Course     IM Toradol and Decadron given here. Flexeril to help with muscle tension. Follow-up as needed.  Final Clinical Impressions(s) / UC Diagnoses   Final diagnoses:  Tension-type headache, not intractable,  unspecified chronicity pattern    New Prescriptions Discharge Medication List as of 11/14/2015  4:22 PM    START taking these medications   Details  cyclobenzaprine (FLEXERIL) 5 MG tablet Take 1 tablet (5 mg total) by mouth at bedtime., Starting Sun 11/14/2015, Normal         Melony Overly, MD 11/14/15 [phone]

## 2015-11-14 NOTE — Discharge Instructions (Signed)
Your headache is more consistent with a tension type headache. This can be exacerbated by muscle tension. We gave you some shots today to try and break the headache. Use the Flexeril at bedtime to help with muscle tension. Shoulder massage will also be beneficial. Follow-up as needed.

## 2015-12-23 ENCOUNTER — Other Ambulatory Visit: Payer: 59

## 2015-12-27 ENCOUNTER — Other Ambulatory Visit (INDEPENDENT_AMBULATORY_CARE_PROVIDER_SITE_OTHER): Payer: 59

## 2015-12-27 DIAGNOSIS — E559 Vitamin D deficiency, unspecified: Secondary | ICD-10-CM

## 2015-12-27 DIAGNOSIS — E119 Type 2 diabetes mellitus without complications: Secondary | ICD-10-CM | POA: Diagnosis not present

## 2015-12-27 DIAGNOSIS — E7800 Pure hypercholesterolemia, unspecified: Secondary | ICD-10-CM

## 2015-12-27 DIAGNOSIS — I10 Essential (primary) hypertension: Secondary | ICD-10-CM | POA: Diagnosis not present

## 2015-12-27 LAB — HEPATIC FUNCTION PANEL
ALK PHOS: 75 U/L (ref 39–117)
ALT: 13 U/L (ref 0–35)
AST: 13 U/L (ref 0–37)
Albumin: 4.4 g/dL (ref 3.5–5.2)
BILIRUBIN DIRECT: 0.1 mg/dL (ref 0.0–0.3)
BILIRUBIN TOTAL: 0.4 mg/dL (ref 0.2–1.2)
TOTAL PROTEIN: 6.7 g/dL (ref 6.0–8.3)

## 2015-12-27 LAB — HEMOGLOBIN A1C: HEMOGLOBIN A1C: 6.8 % — AB (ref 4.6–6.5)

## 2015-12-27 LAB — BASIC METABOLIC PANEL
BUN: 11 mg/dL (ref 6–23)
CALCIUM: 9.2 mg/dL (ref 8.4–10.5)
CHLORIDE: 103 meq/L (ref 96–112)
CO2: 29 mEq/L (ref 19–32)
CREATININE: 0.79 mg/dL (ref 0.40–1.20)
GFR: 97.72 mL/min (ref >60.00)
Glucose, Bld: 119 mg/dL — ABNORMAL HIGH (ref 70–99)
Potassium: 4.2 mEq/L (ref 3.5–5.1)
Sodium: 140 mEq/L (ref 135–145)

## 2015-12-27 LAB — LIPID PANEL
CHOL/HDL RATIO: 4
Cholesterol: 193 mg/dL (ref 0–200)
HDL: 47.8 mg/dL (ref >39.00)
LDL Cholesterol: 124 mg/dL — ABNORMAL HIGH (ref 0–99)
NONHDL: 144.88
TRIGLYCERIDES: 102 mg/dL (ref 0.0–149.0)
VLDL: 20.4 mg/dL (ref 0.0–40.0)

## 2015-12-27 LAB — VITAMIN D 25 HYDROXY (VIT D DEFICIENCY, FRACTURES): VITD: 16.79 ng/mL — AB (ref 30.00–100.00)

## 2015-12-28 ENCOUNTER — Other Ambulatory Visit: Payer: Self-pay | Admitting: Surgical

## 2015-12-28 MED ORDER — ERGOCALCIFEROL 1.25 MG (50000 UT) PO CAPS: 50000.0000 [IU] | ORAL_CAPSULE | ORAL | 0 refills | Status: DC

## 2016-02-29 ENCOUNTER — Ambulatory Visit (INDEPENDENT_AMBULATORY_CARE_PROVIDER_SITE_OTHER): Payer: 59 | Admitting: Internal Medicine

## 2016-02-29 ENCOUNTER — Encounter: Payer: Self-pay | Admitting: Internal Medicine

## 2016-02-29 ENCOUNTER — Other Ambulatory Visit (HOSPITAL_COMMUNITY)
Admission: RE | Admit: 2016-02-29 | Discharge: 2016-02-29 | Disposition: A | Discharge status: Home / Self Care | Payer: 59 | Source: Ambulatory Visit | Attending: Internal Medicine | Admitting: Internal Medicine

## 2016-02-29 VITALS — BP 124/76 | HR 75 | Temp 98.7°F | Resp 16 | Ht 59.0 in | Wt 182.4 lb

## 2016-02-29 DIAGNOSIS — Z124 Encounter for screening for malignant neoplasm of cervix: Secondary | ICD-10-CM

## 2016-02-29 DIAGNOSIS — K219 Gastro-esophageal reflux disease without esophagitis: Secondary | ICD-10-CM

## 2016-02-29 DIAGNOSIS — Z0000 Encounter for general adult medical examination without abnormal findings: Secondary | ICD-10-CM | POA: Diagnosis not present

## 2016-02-29 DIAGNOSIS — I10 Essential (primary) hypertension: Secondary | ICD-10-CM

## 2016-02-29 DIAGNOSIS — Z1231 Encounter for screening mammogram for malignant neoplasm of breast: Secondary | ICD-10-CM

## 2016-02-29 DIAGNOSIS — E559 Vitamin D deficiency, unspecified: Secondary | ICD-10-CM | POA: Diagnosis not present

## 2016-02-29 DIAGNOSIS — F439 Reaction to severe stress, unspecified: Secondary | ICD-10-CM | POA: Diagnosis not present

## 2016-02-29 DIAGNOSIS — Z0279 Encounter for issue of other medical certificate: Secondary | ICD-10-CM

## 2016-02-29 DIAGNOSIS — D649 Anemia, unspecified: Secondary | ICD-10-CM

## 2016-02-29 DIAGNOSIS — Z1151 Encounter for screening for human papillomavirus (HPV): Secondary | ICD-10-CM | POA: Diagnosis present

## 2016-02-29 DIAGNOSIS — Z01419 Encounter for gynecological examination (general) (routine) without abnormal findings: Secondary | ICD-10-CM | POA: Diagnosis present

## 2016-02-29 DIAGNOSIS — E119 Type 2 diabetes mellitus without complications: Secondary | ICD-10-CM

## 2016-02-29 DIAGNOSIS — E7800 Pure hypercholesterolemia, unspecified: Secondary | ICD-10-CM | POA: Diagnosis not present

## 2016-02-29 DIAGNOSIS — Z1239 Encounter for other screening for malignant neoplasm of breast: Secondary | ICD-10-CM

## 2016-02-29 MED ORDER — FLUTICASONE PROPIONATE 50 MCG/ACT NA SUSP: 2.0000 | Freq: Every day | NASAL | 3 refills | Status: AC

## 2016-02-29 NOTE — Assessment & Plan Note (Signed)
Physical today 02/29/16.  PAP 02/29/16.  Mammogram 03/31/15 - Birads I.  Colonoscopy 11/2012 internal hemorrhoids otherwise normal.  Recommend f/u colonoscopy in 10 years.

## 2016-02-29 NOTE — Progress Notes (Signed)
Pre-visit discussion using our clinic review tool. No additional management support is needed unless otherwise documented below in the visit note.  

## 2016-02-29 NOTE — Progress Notes (Signed)
Patient ID: Courtney Donovan, female   DOB: May 16, 1960, 56 y.o.   MRN: [id number]   Subjective:    Patient ID: Courtney Donovan, female    DOB: 1960/03/24, 56 y.o.   MRN: [id number]  HPI  Patient here for her physical exam.  She reports she is doing relatively well.  Trying to stay active.  Discussed importance of exercise.  She works from home.  No chest pain.  No sob.  No acid reflux.  No abdominal pain or cramping.  Bowel stable.  Sees opthalmology.  Pressure increased.     Past Medical History:  Diagnosis Date  . Allergy   . Anemia   . Chronic headaches   . Diabetes mellitus ([place])    diet controlled  . Hypercholesterolemia   . Hypertension    Past Surgical History:  Procedure Laterality Date  . ABDOMINAL HYSTERECTOMY  2011   supracervical hysterectomy  . TONSILECTOMY/ADENOIDECTOMY WITH MYRINGOTOMY  1067  . TUBAL LIGATION  1996   Family History  Problem Relation Age of Onset  . Hypertension Mother   . Cancer Father     lung  . Hypertension Father    Social History   Social History  . Marital status: Married    Spouse name: N/A  . Number of children: 2  . Years of education: N/A   Occupational History  .  [employer]   Social History Main Topics  . Smoking status: Never Smoker  . Smokeless tobacco: Never Used  . Alcohol use No  . Drug use: No  . Sexual activity: Not Asked   Other Topics Concern  . None   Social History Narrative  . None    Outpatient Encounter Prescriptions as of 02/29/2016  Medication Sig  . aspirin 81 MG chewable tablet Chew 81 mg by mouth daily.  . cyclobenzaprine (FLEXERIL) 5 MG tablet Take 1 tablet (5 mg total) by mouth at bedtime.  . ergocalciferol (VITAMIN D2) 50000 units capsule Take 1 capsule (50,000 Units total) by mouth once a week.  . fluticasone (FLONASE) 50 MCG/ACT nasal spray Place 2 sprays into both nostrils daily.  Marland Kitchen LORazepam (ATIVAN) 0.5 MG tablet Take 1/2 -1 tablet q day prn.  . Multiple Vitamin  (MULTIVITAMIN) tablet Take 1 tablet by mouth daily.  Marland Kitchen nystatin cream (MYCOSTATIN) Apply 1 application topically 2 (two) times daily.  Marland Kitchen omeprazole (PRILOSEC) 40 MG capsule Take 1 capsule (40 mg total) by mouth daily.  . rosuvastatin (CRESTOR) 5 MG tablet Take 1 tablet (5 mg total) by mouth daily.  . [DISCONTINUED] fluticasone (FLONASE) 50 MCG/ACT nasal spray Place 2 sprays into both nostrils daily.   No facility-administered encounter medications on file as of 02/29/2016.     Review of Systems  Constitutional: Negative for appetite change and unexpected weight change.  HENT: Negative for congestion and sinus pressure.   Eyes: Negative for pain and visual disturbance.  Respiratory: Negative for cough, chest tightness and shortness of breath.   Cardiovascular: Negative for chest pain, palpitations and leg swelling.  Gastrointestinal: Negative for abdominal pain, diarrhea, nausea and vomiting.  Genitourinary: Negative for difficulty urinating and dysuria.  Musculoskeletal: Negative for back pain and joint swelling.  Skin: Negative for color change and rash.  Neurological: Negative for dizziness, light-headedness and headaches.  Hematological: Negative for adenopathy. Does not bruise/bleed easily.  Psychiatric/Behavioral: Negative for agitation and dysphoric mood.       Objective:    Physical Exam  Constitutional: She is oriented to person,  place, and time. She appears well-developed and well-nourished. No distress.  HENT:  Nose: Nose normal.  Mouth/Throat: Oropharynx is clear and moist.  Eyes: Right eye exhibits no discharge. Left eye exhibits no discharge. No scleral icterus.  Neck: Neck supple. No thyromegaly present.  Cardiovascular: Normal rate and regular rhythm.   Pulmonary/Chest: Breath sounds normal. No accessory muscle usage. No tachypnea. No respiratory distress. She has no decreased breath sounds. She has no wheezes. She has no rhonchi. Right breast exhibits no inverted  nipple, no mass, no nipple discharge and no tenderness (no axillary adenopathy). Left breast exhibits no inverted nipple, no mass, no nipple discharge and no tenderness (no axilarry adenopathy).  Abdominal: Soft. Bowel sounds are normal. There is no tenderness.  Genitourinary:  Genitourinary Comments: Normal external genitalia.  Vaginal vault without lesions.  Pap smear performed.  Could not appreciate any adnexal masses or tenderness.    Musculoskeletal: She exhibits no edema or tenderness.  Lymphadenopathy:    She has no cervical adenopathy.  Neurological: She is alert and oriented to person, place, and time.  Skin: Skin is warm. No rash noted. No erythema.  Psychiatric: She has a normal mood and affect. Her behavior is normal.    BP 124/76 (BP Location: Right Arm, Patient Position: Sitting, Cuff Size: Large)   Pulse 75   Temp 98.7 F (37.1 C) (Oral)   Resp 16   Ht 4' 11" (1.499 m)   Wt 182 lb 6.4 oz (82.7 kg)   SpO2 94%   BMI 36.84 kg/m  Wt Readings from Last 3 Encounters:  02/29/16 182 lb 6.4 oz (82.7 kg)  11/14/15 183 lb (83 kg)  10/21/15 183 lb 9.6 oz (83.3 kg)     Lab Results  Component Value Date   WBC 6.5 08/10/2015   HGB 12.5 08/10/2015   HCT 37.4 08/10/2015   PLT 234.0 08/10/2015   GLUCOSE 119 (H) 12/27/2015   CHOL 193 12/27/2015   TRIG 102.0 12/27/2015   HDL 47.80 12/27/2015   LDLDIRECT 159.5 01/16/2013   LDLCALC 124 (H) 12/27/2015   ALT 13 12/27/2015   AST 13 12/27/2015   NA 140 12/27/2015   K 4.2 12/27/2015   CL 103 12/27/2015   CREATININE 0.79 12/27/2015   BUN 11 12/27/2015   CO2 29 12/27/2015   TSH 1.35 08/10/2015   HGBA1C 6.8 (H) 12/27/2015   MICROALBUR 0.9 08/10/2015       Assessment & Plan:   Problem List Items Addressed This Visit    Anemia    Follow cbc.       Diabetes mellitus ([place])    Low carb diet and exercise.  Follow met b and a1c.       Relevant Orders   Hemoglobin A1c   GERD (gastroesophageal reflux disease)    On  prilosec.  No upper symptoms.  Follow.       Health care maintenance    Physical today 02/29/16.  PAP 02/29/16.  Mammogram 03/31/15 - Birads I.  Colonoscopy 11/2012 internal hemorrhoids otherwise normal.  Recommend f/u colonoscopy in 10 years.        Hypercholesterolemia    Low cholesterol diet and exercise.  Follow lipid panel and liver function tests.  On crestor.        Relevant Orders   Hepatic function panel   Lipid panel   Hypertension    Blood pressure under good control.  Continue same medication regimen.  Follow pressures.  Follow metabolic panel.  Relevant Orders   Basic metabolic panel   Stress    Handling stress.  Follow.       Vitamin D deficiency    Vitamin D supplements.  Follow vitamin D level.         Other Visit Diagnoses    Routine general medical examination at a health care facility    -  Primary   Breast cancer screening       Relevant Orders   MM Digital Screening   Screening for cervical cancer       Relevant Orders   Cytology - PAP (Completed)       Einar Pheasant, MD

## 2016-03-02 LAB — CYTOLOGY - PAP
DIAGNOSIS: NEGATIVE
HPV (WINDOPATH): NOT DETECTED

## 2016-03-03 ENCOUNTER — Encounter: Payer: Self-pay | Admitting: Internal Medicine

## 2016-03-12 ENCOUNTER — Encounter: Payer: Self-pay | Admitting: Internal Medicine

## 2016-03-12 NOTE — Assessment & Plan Note (Signed)
Vitamin D supplements.  Follow vitamin D level.  

## 2016-03-12 NOTE — Assessment & Plan Note (Signed)
Low carb diet and exercise.  Follow met b and a1c.  

## 2016-03-12 NOTE — Assessment & Plan Note (Signed)
Follow cbc.  

## 2016-03-12 NOTE — Assessment & Plan Note (Signed)
Low cholesterol diet and exercise.  Follow lipid panel and liver function tests.  On crestor.   

## 2016-03-12 NOTE — Assessment & Plan Note (Signed)
On prilosec.  No upper symptoms.  Follow.

## 2016-03-12 NOTE — Assessment & Plan Note (Signed)
Handling stress.  Follow.   

## 2016-03-12 NOTE — Assessment & Plan Note (Signed)
Blood pressure under good control.  Continue same medication regimen.  Follow pressures.  Follow metabolic panel.   

## 2016-03-30 ENCOUNTER — Ambulatory Visit
Admission: RE | Admit: 2016-03-30 | Discharge: 2016-03-30 | Disposition: A | Discharge status: Home / Self Care | Payer: 59 | Source: Ambulatory Visit | Attending: Internal Medicine | Admitting: Internal Medicine

## 2016-03-30 DIAGNOSIS — Z1239 Encounter for other screening for malignant neoplasm of breast: Secondary | ICD-10-CM

## 2016-03-30 DIAGNOSIS — Z1231 Encounter for screening mammogram for malignant neoplasm of breast: Secondary | ICD-10-CM | POA: Insufficient documentation

## 2016-06-26 ENCOUNTER — Other Ambulatory Visit: Payer: Self-pay | Admitting: Internal Medicine

## 2016-09-26 ENCOUNTER — Telehealth: Payer: Self-pay | Admitting: Internal Medicine

## 2016-09-26 NOTE — Telephone Encounter (Signed)
Pt dropped off physical form to be filled out by the end of the month. Pt last cpe was 02/29/16. Placed in colored folder upfront

## 2016-09-27 NOTE — Telephone Encounter (Signed)
Placed in your red folder.

## 2016-09-27 NOTE — Telephone Encounter (Signed)
Form signed.  Needs f/u labs and an appt with me.

## 2016-09-27 NOTE — Telephone Encounter (Signed)
Patient informed. Appointments made. Original put at reception for patent to pick up. Copies put for charge and sent to scan.

## 2016-10-06 ENCOUNTER — Other Ambulatory Visit: Payer: Self-pay | Admitting: Internal Medicine

## 2016-10-06 DIAGNOSIS — E119 Type 2 diabetes mellitus without complications: Secondary | ICD-10-CM

## 2016-10-06 NOTE — Progress Notes (Unsigned)
Patient needs Microalbumin  For [facility] quality Metrics.

## 2016-12-20 ENCOUNTER — Other Ambulatory Visit (INDEPENDENT_AMBULATORY_CARE_PROVIDER_SITE_OTHER): Payer: 59

## 2016-12-20 DIAGNOSIS — E7800 Pure hypercholesterolemia, unspecified: Secondary | ICD-10-CM | POA: Diagnosis not present

## 2016-12-20 DIAGNOSIS — E119 Type 2 diabetes mellitus without complications: Secondary | ICD-10-CM | POA: Diagnosis not present

## 2016-12-20 DIAGNOSIS — I10 Essential (primary) hypertension: Secondary | ICD-10-CM

## 2016-12-21 ENCOUNTER — Ambulatory Visit: Payer: 59 | Admitting: Internal Medicine

## 2016-12-21 LAB — LIPID PANEL
CHOL/HDL RATIO: 4
Cholesterol: 194 mg/dL (ref 0–200)
HDL: 45.4 mg/dL (ref >39.00)
LDL Cholesterol: 128 mg/dL — ABNORMAL HIGH (ref 0–99)
NONHDL: 148.66
Triglycerides: 101 mg/dL (ref 0.0–149.0)
VLDL: 20.2 mg/dL (ref 0.0–40.0)

## 2016-12-21 LAB — BASIC METABOLIC PANEL
BUN: 12 mg/dL (ref 6–23)
CALCIUM: 9.5 mg/dL (ref 8.4–10.5)
CO2: 31 mEq/L (ref 19–32)
CREATININE: 0.83 mg/dL (ref 0.40–1.20)
Chloride: 102 mEq/L (ref 96–112)
GFR: 91.97 mL/min (ref >60.00)
GLUCOSE: 101 mg/dL — AB (ref 70–99)
POTASSIUM: 4.4 meq/L (ref 3.5–5.1)
Sodium: 138 mEq/L (ref 135–145)

## 2016-12-21 LAB — HEPATIC FUNCTION PANEL
ALK PHOS: 73 U/L (ref 39–117)
ALT: 9 U/L (ref 0–35)
AST: 12 U/L (ref 0–37)
Albumin: 4.6 g/dL (ref 3.5–5.2)
BILIRUBIN DIRECT: 0 mg/dL (ref 0.0–0.3)
BILIRUBIN TOTAL: 0.4 mg/dL (ref 0.2–1.2)
Total Protein: 7.7 g/dL (ref 6.0–8.3)

## 2016-12-21 LAB — MICROALBUMIN / CREATININE URINE RATIO
CREATININE, U: 154.9 mg/dL
MICROALB UR: 0.9 mg/dL (ref 0.0–1.9)
Microalb Creat Ratio: 0.6 mg/g (ref 0.0–30.0)

## 2016-12-21 LAB — HEMOGLOBIN A1C: HEMOGLOBIN A1C: 6.7 % — AB (ref 4.6–6.5)

## 2016-12-22 ENCOUNTER — Encounter: Payer: Self-pay | Admitting: Internal Medicine

## 2017-01-05 ENCOUNTER — Telehealth: Payer: Self-pay | Admitting: *Deleted

## 2017-01-05 NOTE — Telephone Encounter (Signed)
I'm not sure her info is accurate.  We should all be covered by same insurances,  So please forward her question to the front office.  I will take her if need be,  But I will not be taking ALL Dr Bary Leriche [employer] patients

## 2017-01-05 NOTE — Telephone Encounter (Signed)
All of our Dr.'s are credentialed the same . She will not be able to switch.

## 2017-01-05 NOTE — Telephone Encounter (Signed)
Copied from [place]. Topic: Appointment Scheduling - Scheduling Inquiry for Clinic >> Jan 05, 2017 12:26 PM Clack, Laban Emperor wrote: Reason for CRM: Pt would like to know if she can switch PCP, states her insurance is [employer] and Dr. Nicki Reaper is not cover under everything under her plan. But Dr. Derrel Nip is, she would like to switch PCP. Please f/u with the pt.

## 2017-01-10 NOTE — Telephone Encounter (Signed)
Patient will stay with current PCP.

## 2017-01-26 ENCOUNTER — Ambulatory Visit: Payer: 59 | Admitting: Internal Medicine

## 2017-02-11 ENCOUNTER — Encounter (HOSPITAL_COMMUNITY): Payer: Self-pay | Admitting: *Deleted

## 2017-02-11 ENCOUNTER — Ambulatory Visit (HOSPITAL_COMMUNITY)
Admission: EM | Admit: 2017-02-11 | Discharge: 2017-02-11 | Disposition: A | Discharge status: Home / Self Care | Payer: 59

## 2017-02-11 ENCOUNTER — Other Ambulatory Visit: Payer: Self-pay

## 2017-02-11 DIAGNOSIS — J3089 Other allergic rhinitis: Secondary | ICD-10-CM | POA: Diagnosis not present

## 2017-02-11 DIAGNOSIS — H6123 Impacted cerumen, bilateral: Secondary | ICD-10-CM

## 2017-02-11 MED ORDER — PSEUDOEPHEDRINE HCL 60 MG PO TABS: 60.0000 mg | ORAL_TABLET | Freq: Three times a day (TID) | ORAL | 0 refills | Status: DC | PRN

## 2017-02-11 NOTE — Discharge Instructions (Addendum)
Continue to hydrate very well, use loratadine (Claritin) with Flonase daily. Use Sudafed 60mg  2-3 times daily, 8 hours apart as needed for nasal congestion, sinus congestion, drainage. If your symptoms persist, you get fever, chest pain, sinus pain, then come back because you may need an antibiotic. You may also need to start an additional medication called Singulair if you keep having sinus issues despite taking Flonase, Claritin. We're here to help you if you need this.

## 2017-02-11 NOTE — ED Provider Notes (Signed)
  MRN: [id number] DOB: 23-May-1960  Subjective:   Courtney Donovan is a 57 y.o. female presenting for 2 week history of runny nose, productive cough, post-nasal drainage. Symptoms have improved but she still has ear fullness, sinus pressure, sinus congestion. Has been taking Flonase, Mucinex-DM, loratadine. PCP also recommended patient take Protonix which has provided some relief. Hydrates well. Denies fever, ear drainage, sore throat, chest pain, shob, n/v, abdominal pain. Denies smoking cigarettes.  No current facility-administered medications for this encounter.   Current Outpatient Medications:  .  aspirin 81 MG chewable tablet, Chew 81 mg by mouth daily., Disp: , Rfl:  .  cyclobenzaprine (FLEXERIL) 5 MG tablet, Take 1 tablet (5 mg total) by mouth at bedtime., Disp: 30 tablet, Rfl: 0 .  dextromethorphan-guaiFENesin (MUCINEX DM) 30-600 MG 12hr tablet, Take 1 tablet by mouth 2 (two) times daily., Disp: , Rfl:  .  ergocalciferol (VITAMIN D2) 50000 units capsule, Take 1 capsule (50,000 Units total) by mouth once a week., Disp: 4 capsule, Rfl: 0 .  fluticasone (FLONASE) 50 MCG/ACT nasal spray, Place 2 sprays into both nostrils daily., Disp: 48 g, Rfl: 3 .  LORazepam (ATIVAN) 0.5 MG tablet, Take 1/2 -1 tablet q day prn., Disp: 30 tablet, Rfl: 0 .  Multiple Vitamin (MULTIVITAMIN) tablet, Take 1 tablet by mouth daily., Disp: , Rfl:  .  nystatin cream (MYCOSTATIN), Apply 1 application topically 2 (two) times daily., Disp: 30 g, Rfl: 0 .  omeprazole (PRILOSEC) 40 MG capsule, TAKE ONE CAPSULE BY MOUTH ONCE DAILY, Disp: 30 capsule, Rfl: 2 .  rosuvastatin (CRESTOR) 5 MG tablet, Take 1 tablet (5 mg total) by mouth daily., Disp: 30 tablet, Rfl: 2   Vivan is allergic to metrogel [metronidazole] and pravastatin.  Shanah  has a past medical history of Allergy, Anemia, Chronic headaches, Diabetes mellitus ([place]), Hypercholesterolemia, and Hypertension. Also  has a past surgical history that includes  Abdominal hysterectomy (2011); Tubal ligation (1996); and Tonsilectomy/adenoidectomy with myringotomy (1067).  Objective:   Vitals: BP (!) 143/84 (BP Location: Left Arm) Comment: Notified [place]  Pulse 64   Temp 98.4 F (36.9 C) (Oral)   Resp 16   SpO2 100%   BP Readings from Last 3 Encounters:  02/11/17 (!) 143/84  02/29/16 124/76  11/14/15 132/84    Physical Exam  Constitutional: She is oriented to person, place, and time. She appears well-developed and well-nourished.  HENT:  TM's intact bilaterally, no effusions or erythema. Nasal turbinates boggy and violaceous, nasal passages patent. No sinus tenderness. Oropharynx clear, mucous membranes moist.  Eyes: Right eye exhibits no discharge. Left eye exhibits no discharge.  Neck: Normal range of motion. Neck supple.  Cardiovascular: Normal rate, regular rhythm and intact distal pulses. Exam reveals no gallop and no friction rub.  No murmur heard. Pulmonary/Chest: No respiratory distress. She has no wheezes. She has no rales.  Lymphadenopathy:    She has cervical adenopathy (mild, bilateral, superior).  Neurological: She is alert and oriented to person, place, and time.  Skin: Skin is warm and dry.  Psychiatric: She has a normal mood and affect.   Assessment and Plan :   Allergic rhinitis due to other allergic trigger, unspecified seasonality  Bilateral impacted cerumen  Ear lavage performed today. Add Sudafed to allergy treatment regimen. Return-to-clinic precautions discussed, patient verbalized understanding.    Jaynee Eagles, [place] 02/11/17 1458

## 2017-02-11 NOTE — ED Triage Notes (Signed)
Nasal Congestion, head Pressure, Ear Fullness, Cough,

## 2017-04-16 ENCOUNTER — Other Ambulatory Visit: Payer: Self-pay | Admitting: Internal Medicine

## 2017-04-16 DIAGNOSIS — Z1231 Encounter for screening mammogram for malignant neoplasm of breast: Secondary | ICD-10-CM

## 2017-04-17 ENCOUNTER — Encounter: Payer: Self-pay | Admitting: Internal Medicine

## 2017-04-17 ENCOUNTER — Ambulatory Visit (INDEPENDENT_AMBULATORY_CARE_PROVIDER_SITE_OTHER): Payer: 59 | Admitting: Internal Medicine

## 2017-04-17 DIAGNOSIS — K219 Gastro-esophageal reflux disease without esophagitis: Secondary | ICD-10-CM

## 2017-04-17 DIAGNOSIS — D649 Anemia, unspecified: Secondary | ICD-10-CM | POA: Diagnosis not present

## 2017-04-17 DIAGNOSIS — F439 Reaction to severe stress, unspecified: Secondary | ICD-10-CM

## 2017-04-17 DIAGNOSIS — I10 Essential (primary) hypertension: Secondary | ICD-10-CM

## 2017-04-17 DIAGNOSIS — E7800 Pure hypercholesterolemia, unspecified: Secondary | ICD-10-CM

## 2017-04-17 DIAGNOSIS — E559 Vitamin D deficiency, unspecified: Secondary | ICD-10-CM

## 2017-04-17 DIAGNOSIS — E119 Type 2 diabetes mellitus without complications: Secondary | ICD-10-CM

## 2017-04-17 DIAGNOSIS — R5383 Other fatigue: Secondary | ICD-10-CM

## 2017-04-17 DIAGNOSIS — R51 Headache: Secondary | ICD-10-CM | POA: Diagnosis not present

## 2017-04-17 DIAGNOSIS — R519 Headache, unspecified: Secondary | ICD-10-CM

## 2017-04-17 MED ORDER — TIZANIDINE HCL 4 MG PO TABS: ORAL_TABLET | ORAL | 0 refills | Status: DC

## 2017-04-17 NOTE — Progress Notes (Signed)
Patient ID: Courtney Donovan, female   DOB: 14-Feb-1960, 57 y.o.   MRN: [id number]   Subjective:    Patient ID: Courtney Donovan, female    DOB: 1960-11-13, 57 y.o.   MRN: [id number]  HPI  Patient here for a scheduled follow up.  She reports she is doing relatively well.  Does report some fatigue.  Also has noticed some headaches over the last 2-3 weeks.  Takes naproxen for the headache. At most, will take one per day.  She describes the headache as a sharp pain.  In the temple region and back of eye.  Some previous associated nausea.  Better the last several days.  No dizziness.  No increased sinus pressure.  No fever.  No chest pain or sob.  No acid reflux.  No abdominal pain.  Bowels moving.  No urine change.     Past Medical History:  Diagnosis Date  . Allergy   . Anemia   . Chronic headaches   . Diabetes mellitus ([place])    diet controlled  . Hypercholesterolemia   . Hypertension    Past Surgical History:  Procedure Laterality Date  . ABDOMINAL HYSTERECTOMY  2011   supracervical hysterectomy  . TONSILECTOMY/ADENOIDECTOMY WITH MYRINGOTOMY  1067  . TUBAL LIGATION  1996   Family History  Problem Relation Age of Onset  . Hypertension Mother   . Cancer Father        lung  . Hypertension Father    Social History   Socioeconomic History  . Marital status: Married    Spouse name: Not on file  . Number of children: 2  . Years of education: Not on file  . Highest education level: Not on file  Occupational History    Employer: [employer]  Social Needs  . Financial resource strain: Not on file  . Food insecurity:    Worry: Not on file    Inability: Not on file  . Transportation needs:    Medical: Not on file    Non-medical: Not on file  Tobacco Use  . Smoking status: Never Smoker  . Smokeless tobacco: Never Used  Substance and Sexual Activity  . Alcohol use: No    Alcohol/week: 0.0 oz  . Drug use: No  . Sexual activity: Not on file  Lifestyle  .  Physical activity:    Days per week: Not on file    Minutes per session: Not on file  . Stress: Not on file  Relationships  . Social connections:    Talks on phone: Not on file    Gets together: Not on file    Attends religious service: Not on file    Active member of club or organization: Not on file    Attends meetings of clubs or organizations: Not on file    Relationship status: Not on file  Other Topics Concern  . Not on file  Social History Narrative  . Not on file    Outpatient Encounter Medications as of 04/17/2017  Medication Sig  . aspirin 81 MG chewable tablet Chew 81 mg by mouth daily.  Marland Kitchen dextromethorphan-guaiFENesin (MUCINEX DM) 30-600 MG 12hr tablet Take 1 tablet by mouth 2 (two) times daily.  . ergocalciferol (VITAMIN D2) 50000 units capsule Take 1 capsule (50,000 Units total) by mouth once a week.  . fluticasone (FLONASE) 50 MCG/ACT nasal spray Place 2 sprays into both nostrils daily.  Marland Kitchen LORazepam (ATIVAN) 0.5 MG tablet Take 1/2 -1 tablet q day prn.  Marland Kitchen  Multiple Vitamin (MULTIVITAMIN) tablet Take 1 tablet by mouth daily.  Marland Kitchen nystatin cream (MYCOSTATIN) Apply 1 application topically 2 (two) times daily.  Marland Kitchen omeprazole (PRILOSEC) 40 MG capsule TAKE ONE CAPSULE BY MOUTH ONCE DAILY  . pseudoephedrine (SUDAFED) 60 MG tablet Take 1 tablet (60 mg total) by mouth every 8 (eight) hours as needed for congestion.  . rosuvastatin (CRESTOR) 5 MG tablet Take 1 tablet (5 mg total) by mouth daily.  Marland Kitchen tiZANidine (ZANAFLEX) 4 MG tablet Take one tablet q hs prn  . [DISCONTINUED] cyclobenzaprine (FLEXERIL) 5 MG tablet Take 1 tablet (5 mg total) by mouth at bedtime.   No facility-administered encounter medications on file as of 04/17/2017.     Review of Systems  Constitutional: Negative for appetite change and unexpected weight change.  HENT: Negative for congestion and sinus pressure.   Respiratory: Negative for cough, chest tightness and shortness of breath.   Cardiovascular: Negative  for chest pain, palpitations and leg swelling.  Gastrointestinal: Negative for abdominal pain, diarrhea and vomiting.       Some nausea associated with the headaches.    Genitourinary: Negative for difficulty urinating and dysuria.  Musculoskeletal: Negative for myalgias and neck pain.  Skin: Negative for color change and rash.  Neurological: Positive for headaches. Negative for dizziness and light-headedness.  Psychiatric/Behavioral: Negative for agitation and dysphoric mood.       Objective:    Physical Exam  Constitutional: She appears well-developed and well-nourished. No distress.  HENT:  Nose: Nose normal.  Mouth/Throat: Oropharynx is clear and moist.  Neck: Neck supple. No thyromegaly present.  Cardiovascular: Normal rate and regular rhythm.  Pulmonary/Chest: Breath sounds normal. No respiratory distress. She has no wheezes.  Abdominal: Soft. Bowel sounds are normal. There is no tenderness.  Musculoskeletal: She exhibits no edema or tenderness.  Lymphadenopathy:    She has no cervical adenopathy.  Skin: No rash noted. No erythema.  Psychiatric: She has a normal mood and affect. Her behavior is normal.    BP 126/74 (BP Location: Left Arm, Patient Position: Sitting, Cuff Size: Normal)   Pulse 67   Temp 98.1 F (36.7 C) (Oral)   Resp 18   Wt 176 lb 12.8 oz (80.2 kg)   SpO2 98%   BMI 35.71 kg/m  Wt Readings from Last 3 Encounters:  04/17/17 176 lb 12.8 oz (80.2 kg)  02/29/16 182 lb 6.4 oz (82.7 kg)  11/14/15 183 lb (83 kg)     Lab Results  Component Value Date   WBC 6.5 08/10/2015   HGB 12.5 08/10/2015   HCT 37.4 08/10/2015   PLT 234.0 08/10/2015   GLUCOSE 101 (H) 12/20/2016   CHOL 194 12/20/2016   TRIG 101.0 12/20/2016   HDL 45.40 12/20/2016   LDLDIRECT 159.5 01/16/2013   LDLCALC 128 (H) 12/20/2016   ALT 9 12/20/2016   AST 12 12/20/2016   NA 138 12/20/2016   K 4.4 12/20/2016   CL 102 12/20/2016   CREATININE 0.83 12/20/2016   BUN 12 12/20/2016   CO2  31 12/20/2016   TSH 1.35 08/10/2015   HGBA1C 6.7 (H) 12/20/2016   MICROALBUR 0.9 12/20/2016       Assessment & Plan:   Problem List Items Addressed This Visit    Anemia    Follow cbc.       Relevant Orders   CBC with Differential/Platelet   Diabetes mellitus ([place])    Low carb diet and exercise.  Follow met b and a1c.  Relevant Orders   Hemoglobin O1L   Basic metabolic panel   Fatigue    Did report some fatigue.  Will check routine labs including vitamin D and tsh.       GERD (gastroesophageal reflux disease)    Controlled on current regimen.  Follow       Headache    Has noticed headaches over the last 2-3 weeks.  No definite triggers.  No increased sinus pressure.  No dizziness.  Stay hydrated.  Check labs as outlined.  Continue flonase.  Dicussed further w/up and evaluation.  She declines.  Wants to monitor.        Relevant Medications   tiZANidine (ZANAFLEX) 4 MG tablet   Hypercholesterolemia    On crestor.  Low cholesterol diet and exercise.  Follow lipid panel and liver function tests.        Relevant Orders   Hepatic function panel   Lipid panel   Hypertension    Blood pressure under good control.  Continue same medication regimen.  Follow pressures.  Follow metabolic panel.        Relevant Orders   TSH   Stress    Handling stress.  Follow.        Vitamin D deficiency    Follow vitamin D level.       Relevant Orders   VITAMIN D 25 Hydroxy (Vit-D Deficiency, Fractures)       Einar Pheasant, MD

## 2017-04-21 ENCOUNTER — Encounter: Payer: Self-pay | Admitting: Internal Medicine

## 2017-04-21 NOTE — Assessment & Plan Note (Signed)
Follow vitamin D level.  

## 2017-04-21 NOTE — Assessment & Plan Note (Signed)
Did report some fatigue.  Will check routine labs including vitamin D and tsh.

## 2017-04-21 NOTE — Assessment & Plan Note (Signed)
Follow cbc.  

## 2017-04-21 NOTE — Assessment & Plan Note (Signed)
Handling stress.  Follow.   

## 2017-04-21 NOTE — Assessment & Plan Note (Signed)
Low carb diet and exercise.  Follow met b and a1c.  

## 2017-04-21 NOTE — Assessment & Plan Note (Signed)
Controlled on current regimen.  Follow.  

## 2017-04-21 NOTE — Assessment & Plan Note (Signed)
Blood pressure under good control.  Continue same medication regimen.  Follow pressures.  Follow metabolic panel.   

## 2017-04-21 NOTE — Assessment & Plan Note (Signed)
On crestor.  Low cholesterol diet and exercise.  Follow lipid panel and liver function tests.   

## 2017-04-21 NOTE — Assessment & Plan Note (Signed)
Has noticed headaches over the last 2-3 weeks.  No definite triggers.  No increased sinus pressure.  No dizziness.  Stay hydrated.  Check labs as outlined.  Continue flonase.  Dicussed further w/up and evaluation.  She declines.  Wants to monitor.

## 2017-04-27 ENCOUNTER — Ambulatory Visit
Admission: RE | Admit: 2017-04-27 | Discharge: 2017-04-27 | Disposition: A | Discharge status: Home / Self Care | Payer: 59 | Source: Ambulatory Visit | Attending: Internal Medicine | Admitting: Internal Medicine

## 2017-04-27 DIAGNOSIS — Z1231 Encounter for screening mammogram for malignant neoplasm of breast: Secondary | ICD-10-CM

## 2017-04-30 ENCOUNTER — Other Ambulatory Visit: Payer: 59

## 2017-05-14 ENCOUNTER — Other Ambulatory Visit (INDEPENDENT_AMBULATORY_CARE_PROVIDER_SITE_OTHER): Payer: 59

## 2017-05-14 DIAGNOSIS — E559 Vitamin D deficiency, unspecified: Secondary | ICD-10-CM | POA: Diagnosis not present

## 2017-05-14 DIAGNOSIS — I10 Essential (primary) hypertension: Secondary | ICD-10-CM | POA: Diagnosis not present

## 2017-05-14 DIAGNOSIS — E7800 Pure hypercholesterolemia, unspecified: Secondary | ICD-10-CM

## 2017-05-14 DIAGNOSIS — D649 Anemia, unspecified: Secondary | ICD-10-CM | POA: Diagnosis not present

## 2017-05-14 DIAGNOSIS — E119 Type 2 diabetes mellitus without complications: Secondary | ICD-10-CM | POA: Diagnosis not present

## 2017-05-14 LAB — HEPATIC FUNCTION PANEL
ALBUMIN: 4.3 g/dL (ref 3.5–5.2)
ALK PHOS: 75 U/L (ref 39–117)
ALT: 13 U/L (ref 0–35)
AST: 15 U/L (ref 0–37)
Bilirubin, Direct: 0.1 mg/dL (ref 0.0–0.3)
TOTAL PROTEIN: 7 g/dL (ref 6.0–8.3)
Total Bilirubin: 0.5 mg/dL (ref 0.2–1.2)

## 2017-05-14 LAB — TSH: TSH: 1.2 u[IU]/mL (ref 0.35–4.50)

## 2017-05-14 LAB — BASIC METABOLIC PANEL
BUN: 13 mg/dL (ref 6–23)
CHLORIDE: 104 meq/L (ref 96–112)
CO2: 29 mEq/L (ref 19–32)
CREATININE: 0.69 mg/dL (ref 0.40–1.20)
Calcium: 9.3 mg/dL (ref 8.4–10.5)
GFR: 113.66 mL/min (ref >60.00)
Glucose, Bld: 99 mg/dL (ref 70–99)
Potassium: 4.3 mEq/L (ref 3.5–5.1)
Sodium: 140 mEq/L (ref 135–145)

## 2017-05-14 LAB — CBC WITH DIFFERENTIAL/PLATELET
BASOS ABS: 0 10*3/uL (ref 0.0–0.1)
Basophils Relative: 0.4 % (ref 0.0–3.0)
EOS ABS: 0.1 10*3/uL (ref 0.0–0.7)
Eosinophils Relative: 1.5 % (ref 0.0–5.0)
HEMATOCRIT: 37.6 % (ref 36.0–46.0)
HEMOGLOBIN: 12.5 g/dL (ref 12.0–15.0)
LYMPHS PCT: 32.6 % (ref 12.0–46.0)
Lymphs Abs: 2 10*3/uL (ref 0.7–4.0)
MCHC: 33.3 g/dL (ref 30.0–36.0)
MCV: 86.9 fl (ref 78.0–100.0)
MONOS PCT: 4.2 % (ref 3.0–12.0)
Monocytes Absolute: 0.3 10*3/uL (ref 0.1–1.0)
NEUTROS ABS: 3.7 10*3/uL (ref 1.4–7.7)
Neutrophils Relative %: 61.3 % (ref 43.0–77.0)
Platelets: 236 10*3/uL (ref 150.0–400.0)
RBC: 4.33 Mil/uL (ref 3.87–5.11)
RDW: 14 % (ref 11.5–15.5)
WBC: 6.1 10*3/uL (ref 4.0–10.5)

## 2017-05-14 LAB — VITAMIN D 25 HYDROXY (VIT D DEFICIENCY, FRACTURES): VITD: 14.3 ng/mL — AB (ref 30.00–100.00)

## 2017-05-14 LAB — LIPID PANEL
CHOL/HDL RATIO: 4
Cholesterol: 213 mg/dL — ABNORMAL HIGH (ref 0–200)
HDL: 48.2 mg/dL (ref >39.00)
LDL CALC: 142 mg/dL — AB (ref 0–99)
NONHDL: 164.91
Triglycerides: 114 mg/dL (ref 0.0–149.0)
VLDL: 22.8 mg/dL (ref 0.0–40.0)

## 2017-05-14 LAB — HEMOGLOBIN A1C: HEMOGLOBIN A1C: 6.7 % — AB (ref 4.6–6.5)

## 2017-05-29 ENCOUNTER — Ambulatory Visit (INDEPENDENT_AMBULATORY_CARE_PROVIDER_SITE_OTHER): Payer: 59 | Admitting: Internal Medicine

## 2017-05-29 ENCOUNTER — Other Ambulatory Visit (HOSPITAL_COMMUNITY)
Admission: RE | Admit: 2017-05-29 | Discharge: 2017-05-29 | Disposition: A | Discharge status: Home / Self Care | Payer: 59 | Source: Ambulatory Visit | Attending: Internal Medicine | Admitting: Internal Medicine

## 2017-05-29 VITALS — BP 130/78 | HR 65 | Temp 98.2°F | Resp 16 | Ht 59.0 in | Wt 180.0 lb

## 2017-05-29 DIAGNOSIS — K219 Gastro-esophageal reflux disease without esophagitis: Secondary | ICD-10-CM

## 2017-05-29 DIAGNOSIS — E119 Type 2 diabetes mellitus without complications: Secondary | ICD-10-CM | POA: Diagnosis not present

## 2017-05-29 DIAGNOSIS — E7800 Pure hypercholesterolemia, unspecified: Secondary | ICD-10-CM | POA: Diagnosis not present

## 2017-05-29 DIAGNOSIS — Z0000 Encounter for general adult medical examination without abnormal findings: Secondary | ICD-10-CM | POA: Diagnosis not present

## 2017-05-29 DIAGNOSIS — Z124 Encounter for screening for malignant neoplasm of cervix: Secondary | ICD-10-CM | POA: Insufficient documentation

## 2017-05-29 DIAGNOSIS — E559 Vitamin D deficiency, unspecified: Secondary | ICD-10-CM

## 2017-05-29 DIAGNOSIS — D649 Anemia, unspecified: Secondary | ICD-10-CM

## 2017-05-29 DIAGNOSIS — I10 Essential (primary) hypertension: Secondary | ICD-10-CM

## 2017-05-29 DIAGNOSIS — N898 Other specified noninflammatory disorders of vagina: Secondary | ICD-10-CM | POA: Diagnosis not present

## 2017-05-29 LAB — HM DIABETES FOOT EXAM

## 2017-05-29 NOTE — Progress Notes (Signed)
Patient ID: Courtney Donovan, female   DOB: [birth year], 57 y.o.   MRN: [id number]   Subjective:    Patient ID: Courtney Donovan, female    DOB: 06-Nov-1960, 57 y.o.   MRN: [id number]  HPI  Patient here for her physical exam.  She reports she is doing well.  Feels good.  Trying to stay active.  No exercising as much.  Discussed diet and exercise.  No chest pain.  No sob.  No acid reflux.  No abdominal pain.  Bowels moving.  No urine change.  Discussed lab results.  Discussed cholesterol medication.     Past Medical History:  Diagnosis Date  . Allergy   . Anemia   . Chronic headaches   . Diabetes mellitus ([place])    diet controlled  . Hypercholesterolemia   . Hypertension    Past Surgical History:  Procedure Laterality Date  . ABDOMINAL HYSTERECTOMY  2011   supracervical hysterectomy  . TONSILECTOMY/ADENOIDECTOMY WITH MYRINGOTOMY  1067  . TUBAL LIGATION  1996   Family History  Problem Relation Age of Onset  . Hypertension Mother   . Cancer Father        lung  . Hypertension Father    Social History   Socioeconomic History  . Marital status: Married    Spouse name: Not on file  . Number of children: 2  . Years of education: Not on file  . Highest education level: Not on file  Occupational History    Employer: [employer]  Social Needs  . Financial resource strain: Not on file  . Food insecurity:    Worry: Not on file    Inability: Not on file  . Transportation needs:    Medical: Not on file    Non-medical: Not on file  Tobacco Use  . Smoking status: Never Smoker  . Smokeless tobacco: Never Used  Substance and Sexual Activity  . Alcohol use: No    Alcohol/week: 0.0 oz  . Drug use: No  . Sexual activity: Not on file  Lifestyle  . Physical activity:    Days per week: Not on file    Minutes per session: Not on file  . Stress: Not on file  Relationships  . Social connections:    Talks on phone: Not on file    Gets together: Not on file   Attends religious service: Not on file    Active member of club or organization: Not on file    Attends meetings of clubs or organizations: Not on file    Relationship status: Not on file  Other Topics Concern  . Not on file  Social History Narrative  . Not on file    Outpatient Encounter Medications as of 05/29/2017  Medication Sig  . aspirin 81 MG chewable tablet Chew 81 mg by mouth daily.  Marland Kitchen dextromethorphan-guaiFENesin (MUCINEX DM) 30-600 MG 12hr tablet Take 1 tablet by mouth 2 (two) times daily.  . ergocalciferol (VITAMIN D2) 50000 units capsule Take 1 capsule (50,000 Units total) by mouth once a week.  . fluticasone (FLONASE) 50 MCG/ACT nasal spray Place 2 sprays into both nostrils daily.  Marland Kitchen LORazepam (ATIVAN) 0.5 MG tablet Take 1/2 -1 tablet q day prn.  . Multiple Vitamin (MULTIVITAMIN) tablet Take 1 tablet by mouth daily.  Marland Kitchen nystatin cream (MYCOSTATIN) Apply 1 application topically 2 (two) times daily.  Marland Kitchen omeprazole (PRILOSEC) 40 MG capsule TAKE ONE CAPSULE BY MOUTH ONCE DAILY  . pseudoephedrine (SUDAFED) 60 MG tablet Take  1 tablet (60 mg total) by mouth every 8 (eight) hours as needed for congestion.  . rosuvastatin (CRESTOR) 5 MG tablet Take 1 tablet (5 mg total) by mouth daily.  Marland Kitchen tiZANidine (ZANAFLEX) 4 MG tablet Take one tablet q hs prn  . clindamycin (CLEOCIN) 2 % vaginal cream Place 1 Applicatorful vaginally at bedtime. For 7 days   No facility-administered encounter medications on file as of 05/29/2017.     Review of Systems  Constitutional: Negative for appetite change and unexpected weight change.  HENT: Negative for congestion and sinus pressure.   Eyes: Negative for pain and visual disturbance.  Respiratory: Negative for cough, chest tightness and shortness of breath.   Cardiovascular: Negative for chest pain, palpitations and leg swelling.  Gastrointestinal: Negative for abdominal pain, diarrhea, nausea and vomiting.  Genitourinary: Negative for difficulty  urinating and frequency.  Musculoskeletal: Negative for joint swelling and myalgias.  Skin: Negative for color change and rash.  Neurological: Negative for dizziness, light-headedness and headaches.  Hematological: Negative for adenopathy. Does not bruise/bleed easily.  Psychiatric/Behavioral: Negative for agitation and dysphoric mood.       Objective:     Blood pressure rechecked by me:  126/76  Physical Exam  Constitutional: She is oriented to person, place, and time. She appears well-developed and well-nourished. No distress.  HENT:  Nose: Nose normal.  Mouth/Throat: Oropharynx is clear and moist.  Eyes: Right eye exhibits no discharge. Left eye exhibits no discharge. No scleral icterus.  Neck: Neck supple. No thyromegaly present.  Cardiovascular: Normal rate and regular rhythm.  Pulmonary/Chest: Breath sounds normal. No accessory muscle usage. No tachypnea. No respiratory distress. She has no decreased breath sounds. She has no wheezes. She has no rhonchi. Right breast exhibits no inverted nipple, no mass, no nipple discharge and no tenderness (no axillary adenopathy). Left breast exhibits no inverted nipple, no mass, no nipple discharge and no tenderness (no axilarry adenopathy).  Abdominal: Soft. Bowel sounds are normal. There is no tenderness.  Genitourinary:  Genitourinary Comments: Normal external genitalia.  Vaginal vault without lesions.  Cervix identified.  Pap smear performed.  Could not appreciate any adnexal masses or tenderness.    Musculoskeletal: She exhibits no edema or tenderness.  Feet:  No lesions.  DP pulses palpable and equal bilaterally.  Intact to pin prick and light touch.    Lymphadenopathy:    She has no cervical adenopathy.  Neurological: She is alert and oriented to person, place, and time.  Skin: No rash noted. No erythema.  Psychiatric: She has a normal mood and affect. Her behavior is normal.    BP 130/78 (BP Location: Left Arm, Patient Position:  Sitting, Cuff Size: Normal)   Pulse 65   Temp 98.2 F (36.8 C) (Oral)   Resp 16   Ht '4\' 11"'  (1.499 m)   Wt 180 lb (81.6 kg)   SpO2 99%   BMI 36.36 kg/m  Wt Readings from Last 3 Encounters:  05/29/17 180 lb (81.6 kg)  04/17/17 176 lb 12.8 oz (80.2 kg)  02/29/16 182 lb 6.4 oz (82.7 kg)     Lab Results  Component Value Date   WBC 6.1 05/14/2017   HGB 12.5 05/14/2017   HCT 37.6 05/14/2017   PLT 236.0 05/14/2017   GLUCOSE 99 05/14/2017   CHOL 213 (H) 05/14/2017   TRIG 114.0 05/14/2017   HDL 48.20 05/14/2017   LDLDIRECT 159.5 01/16/2013   LDLCALC 142 (H) 05/14/2017   ALT 13 05/14/2017   AST 15 05/14/2017  NA 140 05/14/2017   K 4.3 05/14/2017   CL 104 05/14/2017   CREATININE 0.69 05/14/2017   BUN 13 05/14/2017   CO2 29 05/14/2017   TSH 1.20 05/14/2017   HGBA1C 6.7 (H) 05/14/2017   MICROALBUR 0.9 12/20/2016       Assessment & Plan:   Problem List Items Addressed This Visit    Anemia    Follow cbc.        Diabetes mellitus ([place])    Low carb diet and exercise.  Follow met b and a1c.  Discussed the need for eye exam.        Relevant Orders   Hemoglobin A1c   GERD (gastroesophageal reflux disease)    Controlled on current regimen.  Follow.        Health care maintenance    Physical today 05/29/17.  PAP 05/29/17.  Mammogram 04/30/17 - birads I.  Colonoscopy 11/2012.  Recommended f/u colonoscopy in 10 years.        Hypercholesterolemia    On crestor.  Take regularly.  Low cholesterol diet and exercise.  Follow lipid panel and liver function tests.        Relevant Orders   Hepatic function panel   Lipid panel   Hypertension    Blood pressure under good control.  Continue same medication regimen.  Follow pressures.  Follow metabolic panel.        Relevant Orders   Basic metabolic panel   Vitamin D deficiency    Follow vitamin  D level.         Other Visit Diagnoses    Routine general medical examination at a health care facility    -  Primary    Screening for cervical cancer       Relevant Orders   Cytology - PAP (Completed)   Vaginal discharge       Relevant Orders   Cervicovaginal ancillary only (Completed)       Einar Pheasant, MD

## 2017-05-29 NOTE — Assessment & Plan Note (Signed)
Physical today 05/29/17.  PAP 05/29/17.  Mammogram 04/30/17 - birads I.  Colonoscopy 11/2012.  Recommended f/u colonoscopy in 10 years.

## 2017-05-29 NOTE — Patient Instructions (Signed)
Vitamin D3 2000 units per day 

## 2017-05-30 LAB — CERVICOVAGINAL ANCILLARY ONLY: WET PREP (BD AFFIRM): POSITIVE — AB

## 2017-05-30 MED ORDER — CLINDAMYCIN PHOSPHATE 2 % VA CREA: 1.0000 | TOPICAL_CREAM | Freq: Every day | VAGINAL | 0 refills | Status: DC

## 2017-06-01 ENCOUNTER — Telehealth: Payer: Self-pay | Admitting: Internal Medicine

## 2017-06-01 NOTE — Telephone Encounter (Signed)
Is there something else we can prescribe for her?

## 2017-06-01 NOTE — Telephone Encounter (Signed)
LMTCB

## 2017-06-01 NOTE — Telephone Encounter (Signed)
Copied from [place] [phone]. Topic: Quick Communication - See Telephone Encounter >> Jun 01, 2017  3:26 PM Rutherford Nail, [place] wrote: CRM for notification. See Telephone encounter for: 06/01/17. Patient calling and states taht the clindamycin (CLEOCIN) 2 % vaginal cream that was called into the pharmacy for her was going to be $88. Patient is requesting another alternative to this medication. CB#:  [place], [place]

## 2017-06-01 NOTE — Telephone Encounter (Signed)
She is allergic to metrogel.  This is the alternative medication.

## 2017-06-04 ENCOUNTER — Encounter: Payer: Self-pay | Admitting: Internal Medicine

## 2017-06-04 NOTE — Assessment & Plan Note (Signed)
Controlled on current regimen.  Follow.  

## 2017-06-04 NOTE — Assessment & Plan Note (Signed)
Follow vitamin D level.  

## 2017-06-04 NOTE — Assessment & Plan Note (Signed)
Blood pressure under good control.  Continue same medication regimen.  Follow pressures.  Follow metabolic panel.   

## 2017-06-04 NOTE — Assessment & Plan Note (Addendum)
Low carb diet and exercise.  Follow met b and a1c.  Discussed the need for eye exam.  

## 2017-06-04 NOTE — Assessment & Plan Note (Signed)
On crestor.  Take regularly.  Low cholesterol diet and exercise.  Follow lipid panel and liver function tests.

## 2017-06-04 NOTE — Assessment & Plan Note (Signed)
Follow cbc.  

## 2017-06-05 ENCOUNTER — Encounter: Payer: Self-pay | Admitting: Internal Medicine

## 2017-06-05 LAB — CYTOLOGY - PAP
Diagnosis: NEGATIVE
HPV: NOT DETECTED

## 2017-06-13 ENCOUNTER — Telehealth: Payer: Self-pay | Admitting: Internal Medicine

## 2017-06-13 NOTE — Telephone Encounter (Signed)
Copied from [place] [phone]. Topic: Quick Communication - See Telephone Encounter >> Jun 13, 2017 12:31 PM Hewitt Shorts wrote: Pt is calling to make a request that her med list be updates and only have the following on her list the next visit   Aspirin, Flonase, Multi Vitamin, Vitamin D over the counter dosage, prilosec  Best number [phone]

## 2017-06-14 NOTE — Telephone Encounter (Signed)
Medication list has been updated. Note: No refills for anything removed from med list will be sent in without approval from provider.

## 2017-07-30 ENCOUNTER — Other Ambulatory Visit: Payer: Self-pay | Admitting: Internal Medicine

## 2017-08-03 ENCOUNTER — Other Ambulatory Visit: Payer: Self-pay

## 2017-08-03 ENCOUNTER — Encounter (HOSPITAL_COMMUNITY): Payer: Self-pay | Admitting: Emergency Medicine

## 2017-08-03 ENCOUNTER — Ambulatory Visit (HOSPITAL_COMMUNITY)
Admission: EM | Admit: 2017-08-03 | Discharge: 2017-08-03 | Disposition: A | Discharge status: Home / Self Care | Payer: 59 | Attending: Internal Medicine | Admitting: Internal Medicine

## 2017-08-03 DIAGNOSIS — R1013 Epigastric pain: Secondary | ICD-10-CM

## 2017-08-03 DIAGNOSIS — K2900 Acute gastritis without bleeding: Secondary | ICD-10-CM

## 2017-08-03 DIAGNOSIS — R0789 Other chest pain: Secondary | ICD-10-CM | POA: Diagnosis not present

## 2017-08-03 MED ORDER — GI COCKTAIL ~~LOC~~
ORAL | Status: AC
  Filled 2017-08-03: qty 30

## 2017-08-03 MED ORDER — GI COCKTAIL ~~LOC~~
30.0000 mL | Freq: Once | ORAL | Status: AC
  Administered 2017-08-03: 30 mL via ORAL

## 2017-08-03 MED ORDER — SUCRALFATE 1 G PO TABS: 1.0000 g | ORAL_TABLET | Freq: Three times a day (TID) | ORAL | 0 refills | Status: DC

## 2017-08-03 NOTE — ED Notes (Signed)
Pt discharged by provider.

## 2017-08-03 NOTE — ED Triage Notes (Signed)
Patient states every time she eats something she gets a pain in her upper abdomen that radiates to her back x 1 week

## 2017-08-03 NOTE — Discharge Instructions (Addendum)
Continue taking Prilosec daily Please use Carafate before meals and at bedtime May add an Maalox as needed for symptom management  Please follow-up with gastroenterology  Please return if chest discomfort changing, worsening, developing worsening pain, lightheadedness, dizziness

## 2017-08-04 NOTE — ED Provider Notes (Signed)
Courtney Donovan    CSN: [id number] Arrival date & time: 08/03/17  1812     History   Chief Complaint Chief Complaint  Patient presents with  . Abdominal Pain    HPI Courtney Donovan Moshe Cipro is a 57 y.o. female history of GERD, hypertension, hyperlipidemia presenting today for evaluation of epigastric pain.  She has pain in her upper abdominal area that will radiate to her back for approximately 1 week.  Pain worsens after she eats.  States that she typically gets reflux in the right side of her chest, but this time she is feeling this sensation in her right as well as her left chest.  Denies associated shortness of breath.  Denies associated nausea or vomiting.  Bowel movements have been normal, denies diarrhea.  She has been using Prilosec without relief.  HPI  Past Medical History:  Diagnosis Date  . Allergy   . Anemia   . Chronic headaches   . Diabetes mellitus ([place])    diet controlled  . Hypercholesterolemia   . Hypertension     Patient Active Problem List   Diagnosis Date Noted  . Stress 04/25/2015  . Vaginal irritation 02/15/2015  . Health care maintenance 10/12/2014  . Fatigue 10/12/2014  . Diarrhea 08/16/2014  . Vitamin D deficiency 04/05/2013  . Rash of neck 04/05/2013  . Headache 01/16/2013  . Menopausal syndrome 10/28/2012  . Postnasal drip 07/01/2012  . Rectal bleeding 07/01/2012  . GERD (gastroesophageal reflux disease) 12/09/2011  . Diabetes mellitus ([place]) 12/09/2011  . Hypertension 12/04/2011  . Anemia 12/04/2011  . Hypercholesterolemia 12/04/2011    Past Surgical History:  Procedure Laterality Date  . ABDOMINAL HYSTERECTOMY  2011   supracervical hysterectomy  . TONSILECTOMY/ADENOIDECTOMY WITH MYRINGOTOMY  1067  . TUBAL LIGATION  1996    OB History   None      Home Medications    Prior to Admission medications   Medication Sig Start Date End Date Taking? Authorizing Provider  aspirin 81 MG chewable tablet Chew 81 mg by  mouth daily.   Yes [provider]  ergocalciferol (VITAMIN D2) 50000 units capsule Take 1 capsule (50,000 Units total) by mouth once a week. 12/28/15  Yes Einar Pheasant, MD  fluticasone (FLONASE) 50 MCG/ACT nasal spray Place 2 sprays into both nostrils daily. 02/29/16  Yes Einar Pheasant, MD  Multiple Vitamin (MULTIVITAMIN) tablet Take 1 tablet by mouth daily.   Yes [provider]  omeprazole (PRILOSEC) 40 MG capsule TAKE 1 CAPSULE BY MOUTH ONCE DAILY 07/30/17  Yes Einar Pheasant, MD  sucralfate (CARAFATE) 1 g tablet Take 1 tablet (1 g total) by mouth 4 (four) times daily -  with meals and at bedtime for 7 days. 08/03/17 08/10/17  Aadvika Konen, Elesa Hacker, PA-C    Family History Family History  Problem Relation Age of Onset  . Hypertension Mother   . Cancer Father        lung  . Hypertension Father     Social History Social History   Tobacco Use  . Smoking status: Never Smoker  . Smokeless tobacco: Never Used  Substance Use Topics  . Alcohol use: No    Alcohol/week: 0.0 oz  . Drug use: No     Allergies   Metrogel [metronidazole] and Pravastatin   Review of Systems Review of Systems  Constitutional: Negative for fatigue and fever.  HENT: Negative for congestion, sinus pressure and sore throat.   Eyes: Negative for photophobia and visual disturbance.  Respiratory: Negative  for cough and shortness of breath.   Cardiovascular: Positive for chest pain.  Gastrointestinal: Positive for abdominal pain. Negative for blood in stool, constipation, diarrhea, nausea and vomiting.  Genitourinary: Negative for dysuria and frequency.  Musculoskeletal: Negative for myalgias, neck pain and neck stiffness.  Neurological: Negative for dizziness, syncope, facial asymmetry, speech difficulty, weakness, light-headedness, numbness and headaches.     Physical Exam Triage Vital Signs ED Triage Vitals  Enc Vitals Group     BP 08/03/17 1912 (!) 155/78     Pulse Rate 08/03/17 1902  69     Resp 08/03/17 1902 16     Temp 08/03/17 1902 97.8 F (36.6 C)     Temp src --      SpO2 08/03/17 1902 100 %     Weight 08/03/17 1858 174 lb (78.9 kg)     Height 08/03/17 1858 4\' 11"  (1.499 m)     Head Circumference --      Peak Flow --      Pain Score 08/03/17 1857 6     Pain Loc --      Pain Edu? --      Excl. in [place]? --    No data found.  Updated Vital Signs BP (!) 155/78 (BP Location: Left Arm)   Pulse 69   Temp 97.8 F (36.6 C)   Resp 16   Ht 4\' 11"  (1.499 m)   Wt 174 lb (78.9 kg)   SpO2 100%   BMI 35.14 kg/m   Visual Acuity Right Eye Distance:   Left Eye Distance:   Bilateral Distance:    Right Eye Near:   Left Eye Near:    Bilateral Near:     Physical Exam  Constitutional: She appears well-developed and well-nourished. No distress.  HENT:  Head: Normocephalic and atraumatic.  Mouth/Throat: Oropharynx is clear and moist.  Eyes: Pupils are equal, round, and reactive to light. Conjunctivae and EOM are normal.  Neck: Neck supple.  Cardiovascular: Normal rate and regular rhythm.  No murmur heard. Pulmonary/Chest: Effort normal and breath sounds normal. No respiratory distress.  Breathing comfortably at rest, CTABL, no wheezing, rales or other adventitious sounds auscultated  Abdominal: Soft. There is no tenderness.  Tenderness to palpation in epigastric area, negative rebound, negative Murphy's  Musculoskeletal: She exhibits no edema.  Neurological: She is alert.  Skin: Skin is warm and dry.  Psychiatric: She has a normal mood and affect.  Nursing note and vitals reviewed.    UC Treatments / Results  Labs (all labs ordered are listed, but only abnormal results are displayed) Labs Reviewed - No data to display  EKG None  Radiology No results found.  Procedures Procedures (including critical care time)  Medications Ordered in UC Medications  gi cocktail (Maalox,Lidocaine,Donnatal) (30 mLs Oral Given 08/03/17 1955)    Initial Impression  / Assessment and Plan / UC Course  I have reviewed the triage vital signs and the nursing notes.  Pertinent labs & imaging results that were available during my care of the patient were reviewed by me and considered in my medical decision making (see chart for details).     EKG showing normal sinus rhythm, no acute changes or signs of ischemia or infarction.  GI cocktail provided which did provide relief of discomfort.  Symptoms likely GI related, GERD versus gastritis versus ulcer.  Will have patient continue Prilosec 40 mg daily, will add in Carafate, Maalox as needed.  Follow-up with gastroenterology for further evaluation and treatment.Discussed  strict return precautions. Patient verbalized understanding and is agreeable with plan.  Final Clinical Impressions(s) / UC Diagnoses   Final diagnoses:  Acute gastritis without hemorrhage, unspecified gastritis type     Discharge Instructions     Continue taking Prilosec daily Please use Carafate before meals and at bedtime May add an Maalox as needed for symptom management  Please follow-up with gastroenterology  Please return if chest discomfort changing, worsening, developing worsening pain, lightheadedness, dizziness   ED Prescriptions    Medication Sig Dispense Auth. Provider   sucralfate (CARAFATE) 1 g tablet Take 1 tablet (1 g total) by mouth 4 (four) times daily -  with meals and at bedtime for 7 days. 28 tablet Craven Crean, Wasta C, PA-C     Controlled Substance Prescriptions [place] Controlled Substance Registry consulted? Not Applicable   Janith Lima, [place] 08/04/17 2200

## 2017-08-07 ENCOUNTER — Telehealth: Payer: Self-pay

## 2017-08-07 NOTE — Telephone Encounter (Signed)
Ok to place new referral or does pt need to be seen?

## 2017-08-07 NOTE — Telephone Encounter (Signed)
Copied from [place] [phone]. Topic: Referral - Request >> Aug 07, 2017 12:52 PM Lennox Solders wrote: Reason for CRM:pt was seen at [facility]/17 for epigastric pain and gerd. Pt has seen gi specialist in 2014 and needs a new referral  . Pt would like a referral to GI dr Gaylyn Cheers phone number [phone]. Pt has [facility]

## 2017-08-08 ENCOUNTER — Other Ambulatory Visit: Payer: Self-pay | Admitting: Internal Medicine

## 2017-08-08 ENCOUNTER — Encounter: Payer: Self-pay | Admitting: Internal Medicine

## 2017-08-08 DIAGNOSIS — K219 Gastro-esophageal reflux disease without esophagitis: Secondary | ICD-10-CM

## 2017-08-08 NOTE — Telephone Encounter (Signed)
Reviewed urgent care note.  She is on prilosec and they added carafate.  How are her symptoms now?  I will place referral to GI, but need to confirm doing ok now.

## 2017-08-08 NOTE — Telephone Encounter (Signed)
Called patient unable to leave message

## 2017-08-08 NOTE — Progress Notes (Signed)
Order placed for GI referral.   

## 2017-08-09 NOTE — Telephone Encounter (Signed)
Per review of note, she reports will feel some better and then feels medication wears off.  I will place order for referral, but if persistent symptoms, probably needs to be reevaluated.  Also need to confirm how she is taking her carafate and make sure taking prilosec 30 minutes before breakfast.

## 2017-08-09 NOTE — Telephone Encounter (Signed)
Sx are not better and patient is not feeling better. See my chart encounter that I sent to you this am

## 2017-08-09 NOTE — Telephone Encounter (Signed)
See phone message for response.  Duplicate message.

## 2017-08-13 ENCOUNTER — Telehealth: Payer: Self-pay

## 2017-08-13 ENCOUNTER — Other Ambulatory Visit: Payer: Self-pay

## 2017-08-13 MED ORDER — SUCRALFATE 1 G PO TABS: 1.0000 g | ORAL_TABLET | Freq: Three times a day (TID) | ORAL | 0 refills | Status: DC

## 2017-08-13 NOTE — Telephone Encounter (Signed)
Per patient, as of Saturday her discomfort has resolved. She has been able to eat more than just a bland diet. She has even had pizza with no sx. Patient is taking carafate TID and she takes her prilosec first thing in the morning before she eats anything. Patient was wondering if we could refill her carafate for her?

## 2017-08-13 NOTE — Telephone Encounter (Signed)
Copied from [place] [phone]. Topic: Quick Communication - Office Called Patient >> Aug 13, 2017 10:33 AM Neva Seat wrote: Pt just missed a call.  Pt was in a meeting.  Please call pt back asap.

## 2017-08-13 NOTE — Telephone Encounter (Signed)
See other phone note

## 2017-08-13 NOTE — Telephone Encounter (Signed)
Pt aware and medication sent in 

## 2017-08-13 NOTE — Telephone Encounter (Signed)
How is she taking her carafate?  Is she taking 1 tid (before each meal).  If so, can refill tid #90 with no refills.  Will need to keep GI appt.

## 2017-09-26 ENCOUNTER — Other Ambulatory Visit: Payer: 59

## 2017-10-01 ENCOUNTER — Ambulatory Visit: Payer: 59 | Admitting: Internal Medicine

## 2017-10-01 ENCOUNTER — Telehealth: Payer: Self-pay

## 2017-10-01 NOTE — Telephone Encounter (Signed)
Copied from [place] [phone]. Topic: Quick Communication - Appointment Cancellation >> Oct 01, 2017  8:06 AM Ivar Drape wrote: Patient called to cancel appointment scheduled for 10/01/17. Patient has not rescheduled their appointment.  Pt had to leave town on an emergency.  Will call back to reschedule.  Patient also stated she tried to cancel the appt on Friday, 09/28/17 but it would not let her.  Then she called but the practice was closed.  Route to department's PEC pool.

## 2018-02-05 ENCOUNTER — Telehealth: Payer: Self-pay | Admitting: Internal Medicine

## 2018-02-05 DIAGNOSIS — R51 Headache: Principal | ICD-10-CM

## 2018-02-05 DIAGNOSIS — R519 Headache, unspecified: Secondary | ICD-10-CM

## 2018-02-05 NOTE — Telephone Encounter (Signed)
Spoke with pt and she stated that she has been seen by Dr. Manuella Ghazi for this before it's just been 3 years since she has been seen so she was needing a new referral. Pt also stated that she doesn't feel that she needs to go to the ED or UC right now because she is not experiencing anything differently than before and because she doesn't want to go spend the money to have them refer her to a Neurologist when she has already been seeing one. The pt was advised that if she starts to experience anything any differently such as vision changes, pain gets worse or anything feels different that she needs to be evaluated immediately. The pt gave a verbal understanding and stated that she is monitoring her symptoms and she stated that if anything at all changes then she will go on to the ED. Pt would also like to schedule an appt with Dr. Nicki Reaper because she had to cancel her last appt.

## 2018-02-05 NOTE — Telephone Encounter (Signed)
Copied from [place]. Topic: Referral - Request for Referral >> Feb 05, 2018  2:02 PM Margot Ables wrote: Has patient seen PCP for this complaint? No - pt request *If NO, is insurance requiring patient see PCP for this issue before PCP can refer them? no Referral for which specialty: Neurology Preferred provider/office: [facility], [place] Reason for referral: sharp pains from skull up/started on Saturday - used to see Dr. Manuella Ghazi but they need new referral - she said if he can't get her in wherever can see her soonest please

## 2018-02-05 NOTE — Telephone Encounter (Signed)
Reviewed message.  States pt having sharp pain in head starting Saturday.  Need more information.  I do not mind referring her to neurology, but if having sharp persistent pain in head, needs to go ahead and be evaluated.  Would recommend evaluation now and then we can f/u after and refer if needed.

## 2018-02-06 NOTE — Telephone Encounter (Signed)
Pt has been scheduled with Dr. Nicki Reaper on 2/6 @ 4:00.

## 2018-02-06 NOTE — Telephone Encounter (Signed)
Order placed for neurology referral.  Needs a f/u appt scheduled with me.

## 2018-02-06 NOTE — Telephone Encounter (Signed)
Noted  

## 2018-02-14 ENCOUNTER — Ambulatory Visit: Payer: 59 | Admitting: Internal Medicine

## 2018-02-14 ENCOUNTER — Encounter: Payer: Self-pay | Admitting: Internal Medicine

## 2018-02-14 VITALS — BP 132/78 | HR 80 | Temp 98.2°F | Resp 16 | Wt 179.2 lb

## 2018-02-14 DIAGNOSIS — E7800 Pure hypercholesterolemia, unspecified: Secondary | ICD-10-CM

## 2018-02-14 DIAGNOSIS — E119 Type 2 diabetes mellitus without complications: Secondary | ICD-10-CM

## 2018-02-14 DIAGNOSIS — R519 Headache, unspecified: Secondary | ICD-10-CM

## 2018-02-14 DIAGNOSIS — R51 Headache: Secondary | ICD-10-CM | POA: Diagnosis not present

## 2018-02-14 DIAGNOSIS — E559 Vitamin D deficiency, unspecified: Secondary | ICD-10-CM

## 2018-02-14 DIAGNOSIS — I10 Essential (primary) hypertension: Secondary | ICD-10-CM | POA: Diagnosis not present

## 2018-02-14 DIAGNOSIS — F439 Reaction to severe stress, unspecified: Secondary | ICD-10-CM

## 2018-02-14 DIAGNOSIS — K219 Gastro-esophageal reflux disease without esophagitis: Secondary | ICD-10-CM

## 2018-02-14 DIAGNOSIS — D649 Anemia, unspecified: Secondary | ICD-10-CM

## 2018-02-14 MED ORDER — SERTRALINE HCL 25 MG PO TABS: 25.0000 mg | ORAL_TABLET | Freq: Every day | ORAL | 1 refills | Status: DC

## 2018-02-14 NOTE — Progress Notes (Signed)
Patient ID: Courtney Donovan, female   DOB: Jan 07, 1961, 58 y.o.   MRN: [id number]   Subjective:    Patient ID: Courtney Donovan, female    DOB: 1960/07/27, 58 y.o.   MRN: [id number]  HPI  Patient here for a scheduled follow up.  Was evaluated yesterday by neurology and diagnosed with occipital neuralgia.  Prescribed baclofen.  Scheduled for MRI.  Labs ordered.  Reports she is trying to stay active.  No chest pain.  No sob.  No acid reflux.  No abdominal pain.  Bowels moving.  No urine change.  States when she talks a lot, etc, gets more anxious.  Increased stress with work.  Feels needs to be on something to help level things out.     Past Medical History:  Diagnosis Date  . Allergy   . Anemia   . Chronic headaches   . Diabetes mellitus ([place])    diet controlled  . Hypercholesterolemia   . Hypertension    Past Surgical History:  Procedure Laterality Date  . ABDOMINAL HYSTERECTOMY  2011   supracervical hysterectomy  . TONSILECTOMY/ADENOIDECTOMY WITH MYRINGOTOMY  1067  . TUBAL LIGATION  1996   Family History  Problem Relation Age of Onset  . Hypertension Mother   . Cancer Father        lung  . Hypertension Father    Social History   Socioeconomic History  . Marital status: Married    Spouse name: Not on file  . Number of children: 2  . Years of education: Not on file  . Highest education level: Not on file  Occupational History    Employer: [employer]  Social Needs  . Financial resource strain: Not on file  . Food insecurity:    Worry: Not on file    Inability: Not on file  . Transportation needs:    Medical: Not on file    Non-medical: Not on file  Tobacco Use  . Smoking status: Never Smoker  . Smokeless tobacco: Never Used  Substance and Sexual Activity  . Alcohol use: No    Alcohol/week: 0.0 standard drinks  . Drug use: No  . Sexual activity: Not on file  Lifestyle  . Physical activity:    Days per week: Not on file    Minutes per  session: Not on file  . Stress: Not on file  Relationships  . Social connections:    Talks on phone: Not on file    Gets together: Not on file    Attends religious service: Not on file    Active member of club or organization: Not on file    Attends meetings of clubs or organizations: Not on file    Relationship status: Not on file  Other Topics Concern  . Not on file  Social History Narrative  . Not on file    Outpatient Encounter Medications as of 02/14/2018  Medication Sig  . baclofen (LIORESAL) 10 MG tablet Take by mouth.  Marland Kitchen aspirin 81 MG chewable tablet Chew 81 mg by mouth daily.  . ergocalciferol (VITAMIN D2) 50000 units capsule Take 1 capsule (50,000 Units total) by mouth once a week.  . fluticasone (FLONASE) 50 MCG/ACT nasal spray Place 2 sprays into both nostrils daily.  . Multiple Vitamin (MULTIVITAMIN) tablet Take 1 tablet by mouth daily.  Marland Kitchen omeprazole (PRILOSEC) 40 MG capsule TAKE 1 CAPSULE BY MOUTH ONCE DAILY  . sertraline (ZOLOFT) 25 MG tablet Take 1 tablet (25 mg total) by mouth  daily.  . sucralfate (CARAFATE) 1 g tablet Take 1 tablet (1 g total) by mouth 3 (three) times daily before meals.   No facility-administered encounter medications on file as of 02/14/2018.     Review of Systems  Constitutional: Negative for appetite change and unexpected weight change.  HENT: Negative for congestion and sinus pressure.   Respiratory: Negative for cough, chest tightness and shortness of breath.   Cardiovascular: Negative for chest pain, palpitations and leg swelling.  Gastrointestinal: Negative for abdominal pain, diarrhea, nausea and vomiting.  Genitourinary: Negative for difficulty urinating and dysuria.  Musculoskeletal: Negative for joint swelling and myalgias.  Skin: Negative for color change and rash.  Neurological: Positive for headaches. Negative for dizziness and light-headedness.  Hematological: Negative for adenopathy. Does not bruise/bleed easily.    Psychiatric/Behavioral: Negative for dysphoric mood.       Increased anxiety as outlined.         Objective:    Physical Exam Constitutional:      General: She is not in acute distress.    Appearance: Normal appearance.  HENT:     Nose: Nose normal. No congestion.     Mouth/Throat:     Pharynx: No oropharyngeal exudate or posterior oropharyngeal erythema.  Neck:     Musculoskeletal: Neck supple. No muscular tenderness.     Thyroid: No thyromegaly.  Cardiovascular:     Rate and Rhythm: Normal rate and regular rhythm.  Pulmonary:     Effort: No respiratory distress.     Breath sounds: Normal breath sounds. No wheezing.  Abdominal:     General: Bowel sounds are normal.     Palpations: Abdomen is soft.     Tenderness: There is no abdominal tenderness.  Musculoskeletal:        General: No swelling or tenderness.  Lymphadenopathy:     Cervical: No cervical adenopathy.  Skin:    Findings: No erythema or rash.  Neurological:     Mental Status: She is alert.  Psychiatric:        Mood and Affect: Mood normal.        Behavior: Behavior normal.     BP 132/78 (BP Location: Left Arm, Patient Position: Sitting, Cuff Size: Normal)   Pulse 80   Temp 98.2 F (36.8 C) (Oral)   Resp 16   Wt 179 lb 3.2 oz (81.3 kg)   SpO2 98%   BMI 36.19 kg/m  Wt Readings from Last 3 Encounters:  02/14/18 179 lb 3.2 oz (81.3 kg)  08/03/17 174 lb (78.9 kg)  05/29/17 180 lb (81.6 kg)     Lab Results  Component Value Date   WBC 6.3 02/14/2018   HGB 12.4 02/14/2018   HCT 37.4 02/14/2018   PLT 220.0 02/14/2018   GLUCOSE 87 02/14/2018   CHOL 210 (H) 02/14/2018   TRIG 112.0 02/14/2018   HDL 41.40 02/14/2018   LDLDIRECT 159.5 01/16/2013   LDLCALC 146 (H) 02/14/2018   ALT 9 02/14/2018   AST 12 02/14/2018   NA 138 02/14/2018   K 4.1 02/14/2018   CL 102 02/14/2018   CREATININE 0.81 02/14/2018   BUN 15 02/14/2018   CO2 27 02/14/2018   TSH 1.00 02/14/2018   HGBA1C 6.5 02/14/2018    MICROALBUR 0.9 12/20/2016       Assessment & Plan:   Problem List Items Addressed This Visit    Anemia    Follow cbc.       Diabetes mellitus ([place])    Low carb  diet and exercise.  Follow met b and a1c.        Relevant Orders   TSH (Completed)   GERD (gastroesophageal reflux disease)    Controlled on current regimen.  Follow.        Headache - Primary    Saw neurology.  Diagnosed with occipital neuralgia.  Started on baclofen.  Planning for MRI brain.  Continue f/u with neurology.        Relevant Medications   baclofen (LIORESAL) 10 MG tablet   sertraline (ZOLOFT) 25 MG tablet   Other Relevant Orders   Vitamin B12 (Completed)   CBC with Differential/Platelet (Completed)   Sedimentation rate (Completed)   C-reactive protein (Completed)   Hypercholesterolemia    On crestor.  Low cholesterol diet and exercise.  Follow lipid panel and liver function tests.        Hypertension    Blood pressure under good control.  Continue same medication regimen.  Follow pressures.  Follow metabolic panel.        Stress    Increased stress and anxiety as outlined.  Start zoloft as directed.  Follow.        Vitamin D deficiency    Follow vitamin D level.  Recheck today.        Relevant Orders   VITAMIN D 25 Hydroxy (Vit-D Deficiency, Fractures) (Completed)       Einar Pheasant, MD

## 2018-02-15 LAB — CBC WITH DIFFERENTIAL/PLATELET
BASOS ABS: 0.1 10*3/uL (ref 0.0–0.1)
Basophils Relative: 0.8 % (ref 0.0–3.0)
Eosinophils Absolute: 0.1 10*3/uL (ref 0.0–0.7)
Eosinophils Relative: 1.4 % (ref 0.0–5.0)
HCT: 37.4 % (ref 36.0–46.0)
Hemoglobin: 12.4 g/dL (ref 12.0–15.0)
LYMPHS ABS: 2.5 10*3/uL (ref 0.7–4.0)
Lymphocytes Relative: 39.7 % (ref 12.0–46.0)
MCHC: 33.1 g/dL (ref 30.0–36.0)
MCV: 88.1 fl (ref 78.0–100.0)
MONOS PCT: 5.3 % (ref 3.0–12.0)
Monocytes Absolute: 0.3 10*3/uL (ref 0.1–1.0)
NEUTROS PCT: 52.8 % (ref 43.0–77.0)
Neutro Abs: 3.3 10*3/uL (ref 1.4–7.7)
Platelets: 220 10*3/uL (ref 150.0–400.0)
RBC: 4.25 Mil/uL (ref 3.87–5.11)
RDW: 13.5 % (ref 11.5–15.5)
WBC: 6.3 10*3/uL (ref 4.0–10.5)

## 2018-02-15 LAB — HEPATIC FUNCTION PANEL
ALT: 9 U/L (ref 0–35)
AST: 12 U/L (ref 0–37)
Albumin: 4.5 g/dL (ref 3.5–5.2)
Alkaline Phosphatase: 75 U/L (ref 39–117)
Bilirubin, Direct: 0 mg/dL (ref 0.0–0.3)
Total Bilirubin: 0.4 mg/dL (ref 0.2–1.2)
Total Protein: 7.2 g/dL (ref 6.0–8.3)

## 2018-02-15 LAB — BASIC METABOLIC PANEL
BUN: 15 mg/dL (ref 6–23)
CALCIUM: 9.6 mg/dL (ref 8.4–10.5)
CO2: 27 meq/L (ref 19–32)
Chloride: 102 mEq/L (ref 96–112)
Creatinine, Ser: 0.81 mg/dL (ref 0.40–1.20)
GFR: 88.62 mL/min (ref >60.00)
Glucose, Bld: 87 mg/dL (ref 70–99)
Potassium: 4.1 mEq/L (ref 3.5–5.1)
SODIUM: 138 meq/L (ref 135–145)

## 2018-02-15 LAB — LIPID PANEL
Cholesterol: 210 mg/dL — ABNORMAL HIGH (ref 0–200)
HDL: 41.4 mg/dL (ref >39.00)
LDL Cholesterol: 146 mg/dL — ABNORMAL HIGH (ref 0–99)
NonHDL: 168.69
TRIGLYCERIDES: 112 mg/dL (ref 0.0–149.0)
Total CHOL/HDL Ratio: 5
VLDL: 22.4 mg/dL (ref 0.0–40.0)

## 2018-02-15 LAB — C-REACTIVE PROTEIN: CRP: 1.5 mg/dL (ref 0.5–20.0)

## 2018-02-15 LAB — VITAMIN D 25 HYDROXY (VIT D DEFICIENCY, FRACTURES): VITD: 17.52 ng/mL — AB (ref 30.00–100.00)

## 2018-02-15 LAB — HEMOGLOBIN A1C: Hgb A1c MFr Bld: 6.5 % (ref 4.6–6.5)

## 2018-02-15 LAB — TSH: TSH: 1 u[IU]/mL (ref 0.35–4.50)

## 2018-02-15 LAB — VITAMIN B12: Vitamin B-12: 244 pg/mL (ref 211–911)

## 2018-02-15 LAB — SEDIMENTATION RATE: Sed Rate: 35 mm/hr — ABNORMAL HIGH (ref 0–30)

## 2018-02-17 ENCOUNTER — Encounter: Payer: Self-pay | Admitting: Internal Medicine

## 2018-02-17 NOTE — Assessment & Plan Note (Signed)
Follow vitamin D level.  Recheck today.

## 2018-02-17 NOTE — Assessment & Plan Note (Signed)
Increased stress and anxiety as outlined.  Start zoloft as directed.  Follow.

## 2018-02-17 NOTE — Assessment & Plan Note (Signed)
Controlled on current regimen.  Follow.  

## 2018-02-17 NOTE — Assessment & Plan Note (Signed)
Saw neurology.  Diagnosed with occipital neuralgia.  Started on baclofen.  Planning for MRI brain.  Continue f/u with neurology.

## 2018-02-17 NOTE — Assessment & Plan Note (Signed)
On crestor.  Low cholesterol diet and exercise.  Follow lipid panel and liver function tests.   

## 2018-02-17 NOTE — Assessment & Plan Note (Signed)
Blood pressure under good control.  Continue same medication regimen.  Follow pressures.  Follow metabolic panel.   

## 2018-02-17 NOTE — Assessment & Plan Note (Signed)
Low carb diet and exercise.  Follow met b and a1c.   

## 2018-02-17 NOTE — Assessment & Plan Note (Signed)
Follow cbc.  

## 2018-02-19 ENCOUNTER — Other Ambulatory Visit: Payer: Self-pay

## 2018-02-19 ENCOUNTER — Telehealth: Payer: Self-pay

## 2018-02-19 ENCOUNTER — Encounter: Payer: Self-pay | Admitting: Internal Medicine

## 2018-02-19 MED ORDER — ROSUVASTATIN CALCIUM 10 MG PO TABS: 10.0000 mg | ORAL_TABLET | Freq: Every day | ORAL | 0 refills | Status: DC

## 2018-02-19 NOTE — Telephone Encounter (Signed)
Mychart message sent.

## 2018-02-19 NOTE — Telephone Encounter (Signed)
Rx sent in

## 2018-02-19 NOTE — Telephone Encounter (Signed)
Copied from [place]. Topic: General - Inquiry >> Feb 19, 2018  4:12 PM Rayann Heman wrote: Reason for CRM: pt called and stated that Dr Lars Mage nurse called with labs and stated that she will be sending in a medication. Pt states that no medication has been sent to pharmacy. Please advise

## 2018-02-19 NOTE — Telephone Encounter (Signed)
Called patient and spoke to her about lab results

## 2018-02-20 ENCOUNTER — Encounter: Payer: Self-pay | Admitting: Internal Medicine

## 2018-02-21 ENCOUNTER — Ambulatory Visit (INDEPENDENT_AMBULATORY_CARE_PROVIDER_SITE_OTHER): Payer: 59

## 2018-02-21 ENCOUNTER — Other Ambulatory Visit: Payer: Self-pay

## 2018-02-21 DIAGNOSIS — E538 Deficiency of other specified B group vitamins: Secondary | ICD-10-CM | POA: Diagnosis not present

## 2018-02-21 MED ORDER — CYANOCOBALAMIN 1000 MCG/ML IJ SOLN
1000.0000 ug | Freq: Once | INTRAMUSCULAR | Status: AC
  Administered 2018-02-21: 1000 ug via INTRAMUSCULAR

## 2018-02-21 MED ORDER — OMEPRAZOLE 40 MG PO CPDR: 40.0000 mg | DELAYED_RELEASE_CAPSULE | Freq: Every day | ORAL | 1 refills | Status: DC

## 2018-02-21 MED ORDER — ROSUVASTATIN CALCIUM 10 MG PO TABS: 10.0000 mg | ORAL_TABLET | Freq: Every day | ORAL | 1 refills | Status: DC

## 2018-02-21 NOTE — Progress Notes (Addendum)
Patient presented today for b12 injection. Left deltoid. Patient had no concerns nor showed any signs of distress during injection.   Reviewed.  Dr Nicki Reaper

## 2018-02-27 ENCOUNTER — Other Ambulatory Visit: Payer: Self-pay | Admitting: Neurology

## 2018-02-27 ENCOUNTER — Other Ambulatory Visit (HOSPITAL_COMMUNITY): Payer: Self-pay | Admitting: Neurology

## 2018-02-27 DIAGNOSIS — H9193 Unspecified hearing loss, bilateral: Secondary | ICD-10-CM

## 2018-03-05 ENCOUNTER — Ambulatory Visit (INDEPENDENT_AMBULATORY_CARE_PROVIDER_SITE_OTHER): Payer: 59

## 2018-03-05 DIAGNOSIS — E538 Deficiency of other specified B group vitamins: Secondary | ICD-10-CM | POA: Diagnosis not present

## 2018-03-05 MED ORDER — CYANOCOBALAMIN 1000 MCG/ML IJ SOLN
1000.0000 ug | Freq: Once | INTRAMUSCULAR | Status: AC
  Administered 2018-03-05: 1000 ug via INTRAMUSCULAR

## 2018-03-05 NOTE — Progress Notes (Addendum)
Patient came in today for B-12 injection in right deltoid. Patient tolerated well.   Reviewed.  Dr Scott 

## 2018-03-07 ENCOUNTER — Ambulatory Visit: Admission: RE | Admit: 2018-03-07 | Payer: 59 | Source: Ambulatory Visit

## 2018-03-13 ENCOUNTER — Ambulatory Visit (INDEPENDENT_AMBULATORY_CARE_PROVIDER_SITE_OTHER): Payer: 59 | Admitting: *Deleted

## 2018-03-13 DIAGNOSIS — E538 Deficiency of other specified B group vitamins: Secondary | ICD-10-CM | POA: Diagnosis not present

## 2018-03-13 MED ORDER — CYANOCOBALAMIN 1000 MCG/ML IJ SOLN
1000.0000 ug | Freq: Once | INTRAMUSCULAR | Status: AC
  Administered 2018-03-13: 1000 ug via INTRAMUSCULAR

## 2018-03-13 NOTE — Progress Notes (Addendum)
Pt came in for vitamin b12, pt tolerated well.  Gae Bon, cma  Reviewed.  Dr Nicki Reaper

## 2018-03-20 ENCOUNTER — Ambulatory Visit (INDEPENDENT_AMBULATORY_CARE_PROVIDER_SITE_OTHER): Payer: 59 | Admitting: Lab

## 2018-03-20 ENCOUNTER — Other Ambulatory Visit: Payer: Self-pay

## 2018-03-20 DIAGNOSIS — E538 Deficiency of other specified B group vitamins: Secondary | ICD-10-CM | POA: Diagnosis not present

## 2018-03-20 MED ORDER — CYANOCOBALAMIN 1000 MCG/ML IJ SOLN
1000.0000 ug | Freq: Once | INTRAMUSCULAR | Status: AC
  Administered 2018-03-20: 1000 ug via INTRAMUSCULAR

## 2018-03-20 NOTE — Progress Notes (Addendum)
Pt here today for VB12 injection in Right- Deltoid. Pt tolerated well.  Reviewed.  Dr Nicki Reaper

## 2018-03-28 NOTE — Progress Notes (Signed)
I resent it to Mable Paris FNP who was working on that date

## 2018-03-29 ENCOUNTER — Encounter: Payer: Self-pay | Admitting: Internal Medicine

## 2018-03-29 ENCOUNTER — Other Ambulatory Visit: Payer: Self-pay

## 2018-03-29 ENCOUNTER — Ambulatory Visit: Payer: 59 | Admitting: Internal Medicine

## 2018-03-29 VITALS — BP 126/78 | HR 74 | Temp 97.3°F | Resp 16 | Wt 181.2 lb

## 2018-03-29 DIAGNOSIS — D649 Anemia, unspecified: Secondary | ICD-10-CM | POA: Diagnosis not present

## 2018-03-29 DIAGNOSIS — R51 Headache: Secondary | ICD-10-CM

## 2018-03-29 DIAGNOSIS — K219 Gastro-esophageal reflux disease without esophagitis: Secondary | ICD-10-CM | POA: Diagnosis not present

## 2018-03-29 DIAGNOSIS — F439 Reaction to severe stress, unspecified: Secondary | ICD-10-CM

## 2018-03-29 DIAGNOSIS — E7800 Pure hypercholesterolemia, unspecified: Secondary | ICD-10-CM

## 2018-03-29 DIAGNOSIS — I10 Essential (primary) hypertension: Secondary | ICD-10-CM

## 2018-03-29 DIAGNOSIS — E119 Type 2 diabetes mellitus without complications: Secondary | ICD-10-CM

## 2018-03-29 DIAGNOSIS — E559 Vitamin D deficiency, unspecified: Secondary | ICD-10-CM

## 2018-03-29 DIAGNOSIS — R519 Headache, unspecified: Secondary | ICD-10-CM

## 2018-03-29 LAB — HEPATIC FUNCTION PANEL
ALT: 14 U/L (ref 0–35)
AST: 14 U/L (ref 0–37)
Albumin: 4.5 g/dL (ref 3.5–5.2)
Alkaline Phosphatase: 85 U/L (ref 39–117)
Bilirubin, Direct: 0.1 mg/dL (ref 0.0–0.3)
Total Bilirubin: 0.3 mg/dL (ref 0.2–1.2)
Total Protein: 7.2 g/dL (ref 6.0–8.3)

## 2018-03-29 NOTE — Progress Notes (Signed)
Sent to [place] who was working that date.

## 2018-03-29 NOTE — Progress Notes (Signed)
Sent to [place] who was working that date.

## 2018-03-29 NOTE — Progress Notes (Signed)
Patient ID: Courtney Donovan, female   DOB: 04/13/1960, 58 y.o.   MRN: [id number]   Subjective:    Patient ID: Courtney Donovan, female    DOB: 03-Nov-1960, 58 y.o.   MRN: [id number]  HPI  Patient here for a scheduled follow up.  She reports she is doing relatively well.  Trying to stay active.  No chest pain.  No sob.  No acid reflux.  No abdominal pain.  Bowels moving.  Taking crestor regularly.  Some acid reflux.  Not taking her omeprazole regularly.  Discussed taking on a regular basis.  Handling stress.  No headache.  No electrical shock sensation.     Past Medical History:  Diagnosis Date  . Allergy   . Anemia   . Chronic headaches   . Diabetes mellitus ([place])    diet controlled  . Hypercholesterolemia   . Hypertension    Past Surgical History:  Procedure Laterality Date  . ABDOMINAL HYSTERECTOMY  2011   supracervical hysterectomy  . TONSILECTOMY/ADENOIDECTOMY WITH MYRINGOTOMY  1067  . TUBAL LIGATION  1996   Family History  Problem Relation Age of Onset  . Hypertension Mother   . Cancer Father        lung  . Hypertension Father    Social History   Socioeconomic History  . Marital status: Married    Spouse name: Not on file  . Number of children: 2  . Years of education: Not on file  . Highest education level: Not on file  Occupational History    Employer: [employer]  Social Needs  . Financial resource strain: Not on file  . Food insecurity:    Worry: Not on file    Inability: Not on file  . Transportation needs:    Medical: Not on file    Non-medical: Not on file  Tobacco Use  . Smoking status: Never Smoker  . Smokeless tobacco: Never Used  Substance and Sexual Activity  . Alcohol use: No    Alcohol/week: 0.0 standard drinks  . Drug use: No  . Sexual activity: Not on file  Lifestyle  . Physical activity:    Days per week: Not on file    Minutes per session: Not on file  . Stress: Not on file  Relationships  . Social connections:     Talks on phone: Not on file    Gets together: Not on file    Attends religious service: Not on file    Active member of club or organization: Not on file    Attends meetings of clubs or organizations: Not on file    Relationship status: Not on file  Other Topics Concern  . Not on file  Social History Narrative  . Not on file    Outpatient Encounter Medications as of 03/29/2018  Medication Sig  . aspirin 81 MG chewable tablet Chew 81 mg by mouth daily.  . ergocalciferol (VITAMIN D2) 50000 units capsule Take 1 capsule (50,000 Units total) by mouth once a week.  . fluticasone (FLONASE) 50 MCG/ACT nasal spray Place 2 sprays into both nostrils daily.  . Multiple Vitamin (MULTIVITAMIN) tablet Take 1 tablet by mouth daily.  Marland Kitchen omeprazole (PRILOSEC) 40 MG capsule Take 1 capsule (40 mg total) by mouth daily.  . rosuvastatin (CRESTOR) 10 MG tablet Take 1 tablet (10 mg total) by mouth daily.  . sertraline (ZOLOFT) 25 MG tablet Take 1 tablet (25 mg total) by mouth daily.  . sucralfate (CARAFATE) 1 g tablet  Take 1 tablet (1 g total) by mouth 3 (three) times daily before meals.   No facility-administered encounter medications on file as of 03/29/2018.     Review of Systems  Constitutional: Negative for appetite change and unexpected weight change.  HENT: Negative for congestion and sinus pressure.   Respiratory: Negative for cough, chest tightness and shortness of breath.   Cardiovascular: Negative for chest pain, palpitations and leg swelling.  Gastrointestinal: Negative for abdominal pain, diarrhea, nausea and vomiting.  Genitourinary: Negative for difficulty urinating and dysuria.  Musculoskeletal: Negative for joint swelling and myalgias.  Skin: Negative for color change and rash.  Neurological: Negative for dizziness, light-headedness and headaches.  Psychiatric/Behavioral: Negative for agitation and dysphoric mood.       Objective:    Physical Exam Constitutional:      General:  She is not in acute distress.    Appearance: Normal appearance.  HENT:     Nose: Nose normal. No congestion.     Mouth/Throat:     Pharynx: No oropharyngeal exudate or posterior oropharyngeal erythema.  Neck:     Musculoskeletal: Neck supple. No muscular tenderness.     Thyroid: No thyromegaly.  Cardiovascular:     Rate and Rhythm: Normal rate and regular rhythm.  Pulmonary:     Effort: No respiratory distress.     Breath sounds: Normal breath sounds. No wheezing.  Abdominal:     General: Bowel sounds are normal.     Palpations: Abdomen is soft.     Tenderness: There is no abdominal tenderness.  Musculoskeletal:        General: No swelling or tenderness.  Lymphadenopathy:     Cervical: No cervical adenopathy.  Skin:    Findings: No erythema or rash.  Neurological:     Mental Status: She is alert.  Psychiatric:        Mood and Affect: Mood normal.        Behavior: Behavior normal.     BP 126/78   Pulse 74   Temp (!) 97.3 F (36.3 C) (Oral)   Resp 16   Wt 181 lb 3.2 oz (82.2 kg)   SpO2 99%   BMI 36.60 kg/m  Wt Readings from Last 3 Encounters:  03/29/18 181 lb 3.2 oz (82.2 kg)  02/14/18 179 lb 3.2 oz (81.3 kg)  08/03/17 174 lb (78.9 kg)     Lab Results  Component Value Date   WBC 6.3 02/14/2018   HGB 12.4 02/14/2018   HCT 37.4 02/14/2018   PLT 220.0 02/14/2018   GLUCOSE 87 02/14/2018   CHOL 210 (H) 02/14/2018   TRIG 112.0 02/14/2018   HDL 41.40 02/14/2018   LDLDIRECT 159.5 01/16/2013   LDLCALC 146 (H) 02/14/2018   ALT 14 03/29/2018   AST 14 03/29/2018   NA 138 02/14/2018   K 4.1 02/14/2018   CL 102 02/14/2018   CREATININE 0.81 02/14/2018   BUN 15 02/14/2018   CO2 27 02/14/2018   TSH 1.00 02/14/2018   HGBA1C 6.5 02/14/2018   MICROALBUR 0.9 12/20/2016       Assessment & Plan:   Problem List Items Addressed This Visit    Anemia    Follow cbc.        Diabetes mellitus ([place])    Low carb diet and exercise.  Follow met b and a1c.        GERD  (gastroesophageal reflux disease)    Reports acid reflux.  Restart omeprazole.  Take daily.  Follow.  If  persistent symptoms, will require f/u with GI.        Headache    Saw neurology.  Diagnosed with occipital neuralgia.  Has baclofen if needed.  Had MRI.  Doing better.  Follow.        Hypercholesterolemia - Primary    On crestor.  Low cholesterol diet and exercise.  Follow lipid panel and liver function tests.        Relevant Orders   Hepatic function panel (Completed)   Hypertension    Blood pressure under good control.  Continue same medication regimen.  Follow pressures.  Follow metabolic panel.        Stress    Increased stress.  Had given her a prescription for zoloft.  Not taking.  Discussed taking.  She plans to start.  Follow.        Vitamin D deficiency    Follow vitamin D level.            Einar Pheasant, MD

## 2018-03-29 NOTE — Progress Notes (Signed)
Sent to [place] who was working that date.

## 2018-03-31 ENCOUNTER — Encounter: Payer: Self-pay | Admitting: Internal Medicine

## 2018-03-31 NOTE — Assessment & Plan Note (Addendum)
On crestor.  Low cholesterol diet and exercise.  Follow lipid panel and liver function tests.   

## 2018-03-31 NOTE — Assessment & Plan Note (Signed)
Reports acid reflux.  Restart omeprazole.  Take daily.  Follow.  If persistent symptoms, will require f/u with GI.

## 2018-03-31 NOTE — Assessment & Plan Note (Signed)
Blood pressure under good control.  Continue same medication regimen.  Follow pressures.  Follow metabolic panel.   

## 2018-03-31 NOTE — Assessment & Plan Note (Signed)
Increased stress.  Had given her a prescription for zoloft.  Not taking.  Discussed taking.  She plans to start.  Follow.

## 2018-03-31 NOTE — Assessment & Plan Note (Signed)
Follow cbc.  

## 2018-03-31 NOTE — Assessment & Plan Note (Signed)
Low carb diet and exercise.  Follow met b and a1c.   

## 2018-03-31 NOTE — Assessment & Plan Note (Signed)
Saw neurology.  Diagnosed with occipital neuralgia.  Has baclofen if needed.  Had MRI.  Doing better.  Follow.

## 2018-03-31 NOTE — Assessment & Plan Note (Signed)
Follow vitamin D level.  

## 2018-03-31 NOTE — Progress Notes (Signed)
  I have reviewed the above information and agree with above.   Rufino Staup, MD 

## 2018-04-02 ENCOUNTER — Other Ambulatory Visit: Payer: 59

## 2018-04-12 NOTE — Progress Notes (Signed)
I already sent it to a Provider working on that date

## 2018-04-23 ENCOUNTER — Ambulatory Visit: Payer: 59

## 2018-04-24 ENCOUNTER — Telehealth: Payer: Self-pay

## 2018-04-24 NOTE — Telephone Encounter (Signed)
Copied from [place] [id number]. Topic: General - Other >> Apr 23, 2018  4:35 PM Wynetta Emery, [place] C wrote: Reason for CRM: pt called In to reschedule B-12 shot, pt says that she overslept  CB: [phone]

## 2018-04-24 NOTE — Telephone Encounter (Addendum)
Called patient to schedule B 12 injection ,while on phone patient stated she wanted to send BP reading by my chart ask patient if they were out of range she stated they are some what , so advised patient to send PCP readings and I would schedule her a virtual visit for Friday with PCP to discuss BP readings patient agreed. Readings sometimes are in 120/70 range and at other days is in the 140/80 range.  B 12 injection scheduled for 04/25/18 at 3 PM

## 2018-04-24 NOTE — Telephone Encounter (Signed)
Noted.  See attached. Pt on schedule.

## 2018-04-25 ENCOUNTER — Ambulatory Visit (INDEPENDENT_AMBULATORY_CARE_PROVIDER_SITE_OTHER): Payer: 59 | Admitting: Lab

## 2018-04-25 ENCOUNTER — Other Ambulatory Visit: Payer: Self-pay

## 2018-04-25 ENCOUNTER — Telehealth: Payer: Self-pay | Admitting: Internal Medicine

## 2018-04-25 DIAGNOSIS — E538 Deficiency of other specified B group vitamins: Secondary | ICD-10-CM | POA: Diagnosis not present

## 2018-04-25 MED ORDER — CYANOCOBALAMIN 1000 MCG/ML IJ SOLN
1000.0000 ug | Freq: Once | INTRAMUSCULAR | Status: AC
  Administered 2018-04-25: 1000 ug via INTRAMUSCULAR

## 2018-04-25 NOTE — Progress Notes (Signed)
Pt here today for Vit B-12 injection in left Deltoid. Pt tolerated well  Reviewed.  Dr Nicki Reaper

## 2018-04-25 NOTE — Telephone Encounter (Signed)
Pt drop off her BP readings. It's in color folder up front. Thank you!

## 2018-04-26 ENCOUNTER — Encounter: Payer: Self-pay | Admitting: Internal Medicine

## 2018-04-26 ENCOUNTER — Ambulatory Visit (INDEPENDENT_AMBULATORY_CARE_PROVIDER_SITE_OTHER): Payer: 59 | Admitting: Internal Medicine

## 2018-04-26 DIAGNOSIS — R519 Headache, unspecified: Secondary | ICD-10-CM

## 2018-04-26 DIAGNOSIS — K219 Gastro-esophageal reflux disease without esophagitis: Secondary | ICD-10-CM

## 2018-04-26 DIAGNOSIS — D649 Anemia, unspecified: Secondary | ICD-10-CM

## 2018-04-26 DIAGNOSIS — E119 Type 2 diabetes mellitus without complications: Secondary | ICD-10-CM | POA: Diagnosis not present

## 2018-04-26 DIAGNOSIS — I10 Essential (primary) hypertension: Secondary | ICD-10-CM

## 2018-04-26 DIAGNOSIS — E559 Vitamin D deficiency, unspecified: Secondary | ICD-10-CM

## 2018-04-26 DIAGNOSIS — E7800 Pure hypercholesterolemia, unspecified: Secondary | ICD-10-CM

## 2018-04-26 DIAGNOSIS — R51 Headache: Secondary | ICD-10-CM

## 2018-04-26 DIAGNOSIS — F439 Reaction to severe stress, unspecified: Secondary | ICD-10-CM

## 2018-04-26 NOTE — Telephone Encounter (Signed)
Holding for appt today

## 2018-04-26 NOTE — Progress Notes (Addendum)
Patient ID: Courtney Donovan, female   DOB: 03-Feb-1960, 58 y.o.   MRN: [id number] Virtual Visit via Video: Note  This visit type was conducted due to national recommendations for restrictions regarding the COVID-19 pandemic (e.g. social distancing).  This format is felt to be most appropriate for this patient at this time.  All issues noted in this document were discussed and addressed.  No physical exam was performed (except for noted visual exam findings with Video Visits).   I connected with [name] on 04/26/18 at  3:30 PM EDT by a video enabled telemedicine application.  Verified that I am speaking with the correct person using two identifiers. Location patient: home Location provider: work Persons participating in the virtual visit: patient, provider  I discussed the limitations, risks, security and privacy concerns of performing an evaluation and management service by video and the availability of in person appointments.The patient expressed understanding and agreed to proceed.   Reason for visit: scheduled follow up.   HPI: She reports she is doing relatively well.  Still with increased anxiety.  Not taking the zoloft.  Still has not started.  Discussed her anxiety. Discussed taking the medication.  She plans to start tomorrow.  Staying in.  No known COVID exposure.  No fever.  No chest congestion or sob.  No chest pain.  No acid reflux.  On omeprazole.  No abdominal pain.  Bowels moving.  No significant headache issue now.  Better.  Has seen neurology.  Taking crestor regularly.  Trying to watch her diet.  Discussed diet and exercise.  Has been monitoring her blood pressures.  Blood pressures averaging 120-130/70-80s.     ROS: See pertinent positives and negatives per HPI.  Past Medical History:  Diagnosis Date  . Allergy   . Anemia   . Chronic headaches   . Diabetes mellitus ([place])    diet controlled  . Hypercholesterolemia   . Hypertension     Past  Surgical History:  Procedure Laterality Date  . ABDOMINAL HYSTERECTOMY  2011   supracervical hysterectomy  . TONSILECTOMY/ADENOIDECTOMY WITH MYRINGOTOMY  1067  . TUBAL LIGATION  1996    Family History  Problem Relation Age of Onset  . Hypertension Mother   . Cancer Father        lung  . Hypertension Father     SOCIAL HX: reviewed.    Current Outpatient Medications:  .  aspirin 81 MG chewable tablet, Chew 81 mg by mouth daily., Disp: , Rfl:  .  ergocalciferol (VITAMIN D2) 50000 units capsule, Take 1 capsule (50,000 Units total) by mouth once a week., Disp: 4 capsule, Rfl: 0 .  fluticasone (FLONASE) 50 MCG/ACT nasal spray, Place 2 sprays into both nostrils daily., Disp: 48 g, Rfl: 3 .  Multiple Vitamin (MULTIVITAMIN) tablet, Take 1 tablet by mouth daily., Disp: , Rfl:  .  omeprazole (PRILOSEC) 40 MG capsule, Take 1 capsule (40 mg total) by mouth daily., Disp: 90 capsule, Rfl: 1 .  rosuvastatin (CRESTOR) 10 MG tablet, Take 1 tablet (10 mg total) by mouth daily., Disp: 90 tablet, Rfl: 1 .  sertraline (ZOLOFT) 25 MG tablet, Take 1 tablet (25 mg total) by mouth daily., Disp: 30 tablet, Rfl: 1 .  sucralfate (CARAFATE) 1 g tablet, Take 1 tablet (1 g total) by mouth 3 (three) times daily before meals., Disp: 90 tablet, Rfl: 0  EXAM:  GENERAL: alert, oriented, appears well and in no acute distress  HEENT: atraumatic, conjunttiva clear, no obvious  abnormalities on inspection of external nose and ears  NECK: normal movements of the head and neck  LUNGS: on inspection no signs of respiratory distress, breathing rate appears normal, no obvious gross SOB, gasping or wheezing  CV: no obvious cyanosis  PSYCH/NEURO: pleasant and cooperative, no obvious depression or anxiety, speech and thought processing grossly intact  ASSESSMENT AND PLAN:  Discussed the following assessment and plan:  Anemia, unspecified type  Type 2 diabetes mellitus without complication, without long-term current  use of insulin ([facility])  Gastroesophageal reflux disease, esophagitis presence not specified  Nonintractable headache, unspecified chronicity pattern, unspecified headache type  Hypercholesterolemia  Essential hypertension  Stress  Vitamin D deficiency  Anemia Follow cbc.    Diabetes mellitus Low carb diet and exercise.  Follow met b and a1c.    GERD (gastroesophageal reflux disease) No reported problems today.  Omeprazole.    Headache Has seen neurology.  Diagnosed with occipital neuralgia.  Has baclofen if needed.  Doing better.  Follow.    Hypercholesterolemia On crestor.  Low cholesterol diet and exercise.  Follow lipid panel and liver function tests.    Hypertension Blood pressures as outlined.  Doing well on no medication.  Follow pressures.    Stress Increased stress and anxiety as outlined.  Discussed with her today.  Has rx for zoloft.  Plans to start tomorrow.    Vitamin D deficiency Follow vitamin D level.      I discussed the assessment and treatment plan with the patient. The patient was provided an opportunity to ask questions and all were answered. The patient agreed with the plan and demonstrated an understanding of the instructions.   The patient was advised to call back or seek an in-person evaluation if the symptoms worsen or if the condition fails to improve as anticipated.    Einar Pheasant, MD

## 2018-04-28 ENCOUNTER — Encounter: Payer: Self-pay | Admitting: Internal Medicine

## 2018-04-28 NOTE — Assessment & Plan Note (Signed)
Follow cbc.  

## 2018-04-28 NOTE — Assessment & Plan Note (Signed)
Has seen neurology.  Diagnosed with occipital neuralgia.  Has baclofen if needed.  Doing better.  Follow.

## 2018-04-28 NOTE — Assessment & Plan Note (Signed)
On crestor.  Low cholesterol diet and exercise.  Follow lipid panel and liver function tests.   

## 2018-04-28 NOTE — Assessment & Plan Note (Signed)
Low carb diet and exercise.  Follow met b and a1c.   

## 2018-04-28 NOTE — Assessment & Plan Note (Signed)
No reported problems today.  Omeprazole.

## 2018-04-28 NOTE — Assessment & Plan Note (Signed)
Blood pressures as outlined.  Doing well on no medication.  Follow pressures.

## 2018-04-28 NOTE — Assessment & Plan Note (Signed)
Follow vitamin D level.  

## 2018-04-28 NOTE — Assessment & Plan Note (Signed)
Increased stress and anxiety as outlined.  Discussed with her today.  Has rx for zoloft.  Plans to start tomorrow.

## 2018-04-29 ENCOUNTER — Encounter: Payer: Self-pay | Admitting: Internal Medicine

## 2018-04-30 NOTE — Telephone Encounter (Signed)
Confirmed that patient is doing okay. She is better since stopping zoloft. No acute symptoms present at this time. She is willing to try something else but cannot tolerate zoloft.

## 2018-04-30 NOTE — Telephone Encounter (Signed)
Ok

## 2018-04-30 NOTE — Telephone Encounter (Signed)
Since she had such a bad reaction to zoloft, I recommend trying buspar to see if this helps her symptoms.  If agreeable, let me know and I will send in rx.

## 2018-05-01 MED ORDER — BUSPIRONE HCL 5 MG PO TABS: 5.0000 mg | ORAL_TABLET | Freq: Two times a day (BID) | ORAL | 0 refills | Status: DC | PRN

## 2018-05-01 NOTE — Telephone Encounter (Signed)
rx sent in for buspray.  Pt can take one tablet twice a day as needed.  If does ok, then notify and can send in refill.

## 2018-05-01 NOTE — Telephone Encounter (Signed)
rx sent in for buspar

## 2018-05-01 NOTE — Telephone Encounter (Signed)
Pt is agreeable to try Buspar. Would like it sent to [facility]

## 2018-05-02 NOTE — Telephone Encounter (Signed)
Pt aware.

## 2018-08-12 ENCOUNTER — Other Ambulatory Visit: Payer: Self-pay | Admitting: Internal Medicine

## 2018-08-12 DIAGNOSIS — Z1231 Encounter for screening mammogram for malignant neoplasm of breast: Secondary | ICD-10-CM

## 2018-08-19 ENCOUNTER — Telehealth: Payer: Self-pay | Admitting: *Deleted

## 2018-08-19 DIAGNOSIS — I10 Essential (primary) hypertension: Secondary | ICD-10-CM

## 2018-08-19 DIAGNOSIS — E7800 Pure hypercholesterolemia, unspecified: Secondary | ICD-10-CM

## 2018-08-19 DIAGNOSIS — E119 Type 2 diabetes mellitus without complications: Secondary | ICD-10-CM

## 2018-08-19 NOTE — Telephone Encounter (Signed)
Please place future orders for lab appt.  

## 2018-08-20 ENCOUNTER — Other Ambulatory Visit (INDEPENDENT_AMBULATORY_CARE_PROVIDER_SITE_OTHER): Payer: 59

## 2018-08-20 ENCOUNTER — Other Ambulatory Visit: Payer: Self-pay

## 2018-08-20 DIAGNOSIS — E7800 Pure hypercholesterolemia, unspecified: Secondary | ICD-10-CM

## 2018-08-20 DIAGNOSIS — E119 Type 2 diabetes mellitus without complications: Secondary | ICD-10-CM

## 2018-08-20 LAB — HEPATIC FUNCTION PANEL
ALT: 10 U/L (ref 0–35)
AST: 11 U/L (ref 0–37)
Albumin: 4.4 g/dL (ref 3.5–5.2)
Alkaline Phosphatase: 79 U/L (ref 39–117)
Bilirubin, Direct: 0.1 mg/dL (ref 0.0–0.3)
Total Bilirubin: 0.4 mg/dL (ref 0.2–1.2)
Total Protein: 6.8 g/dL (ref 6.0–8.3)

## 2018-08-20 LAB — HEMOGLOBIN A1C: Hgb A1c MFr Bld: 6.8 % — ABNORMAL HIGH (ref 4.6–6.5)

## 2018-08-20 LAB — LIPID PANEL
Cholesterol: 185 mg/dL (ref 0–200)
HDL: 43.6 mg/dL (ref >39.00)
LDL Cholesterol: 121 mg/dL — ABNORMAL HIGH (ref 0–99)
NonHDL: 141.21
Total CHOL/HDL Ratio: 4
Triglycerides: 101 mg/dL (ref 0.0–149.0)
VLDL: 20.2 mg/dL (ref 0.0–40.0)

## 2018-08-20 LAB — BASIC METABOLIC PANEL
BUN: 14 mg/dL (ref 6–23)
CO2: 29 mEq/L (ref 19–32)
Calcium: 9.5 mg/dL (ref 8.4–10.5)
Chloride: 104 mEq/L (ref 96–112)
Creatinine, Ser: 0.85 mg/dL (ref 0.40–1.20)
GFR: 83.67 mL/min (ref >60.00)
Glucose, Bld: 120 mg/dL — ABNORMAL HIGH (ref 70–99)
Potassium: 4.1 mEq/L (ref 3.5–5.1)
Sodium: 139 mEq/L (ref 135–145)

## 2018-08-20 LAB — MICROALBUMIN / CREATININE URINE RATIO
Creatinine,U: 167.3 mg/dL
Microalb Creat Ratio: 0.4 mg/g (ref 0.0–30.0)
Microalb, Ur: <0.7 mg/dL (ref 0.0–1.9)

## 2018-08-20 NOTE — Telephone Encounter (Signed)
Orders placed for f/u labs.  

## 2018-08-22 ENCOUNTER — Other Ambulatory Visit (HOSPITAL_COMMUNITY)
Admission: RE | Admit: 2018-08-22 | Discharge: 2018-08-22 | Disposition: A | Discharge status: Home / Self Care | Payer: 59 | Source: Ambulatory Visit | Attending: Internal Medicine | Admitting: Internal Medicine

## 2018-08-22 ENCOUNTER — Ambulatory Visit (INDEPENDENT_AMBULATORY_CARE_PROVIDER_SITE_OTHER): Payer: 59 | Admitting: Internal Medicine

## 2018-08-22 ENCOUNTER — Other Ambulatory Visit: Payer: Self-pay

## 2018-08-22 VITALS — BP 126/70 | HR 79 | Temp 97.1°F | Resp 16 | Ht 59.0 in | Wt 179.0 lb

## 2018-08-22 DIAGNOSIS — E119 Type 2 diabetes mellitus without complications: Secondary | ICD-10-CM | POA: Diagnosis not present

## 2018-08-22 DIAGNOSIS — K219 Gastro-esophageal reflux disease without esophagitis: Secondary | ICD-10-CM | POA: Diagnosis not present

## 2018-08-22 DIAGNOSIS — D649 Anemia, unspecified: Secondary | ICD-10-CM | POA: Diagnosis not present

## 2018-08-22 DIAGNOSIS — I10 Essential (primary) hypertension: Secondary | ICD-10-CM

## 2018-08-22 DIAGNOSIS — E559 Vitamin D deficiency, unspecified: Secondary | ICD-10-CM

## 2018-08-22 DIAGNOSIS — R51 Headache: Secondary | ICD-10-CM

## 2018-08-22 DIAGNOSIS — N898 Other specified noninflammatory disorders of vagina: Secondary | ICD-10-CM | POA: Diagnosis present

## 2018-08-22 DIAGNOSIS — R519 Headache, unspecified: Secondary | ICD-10-CM

## 2018-08-22 DIAGNOSIS — Z0000 Encounter for general adult medical examination without abnormal findings: Secondary | ICD-10-CM

## 2018-08-22 DIAGNOSIS — F439 Reaction to severe stress, unspecified: Secondary | ICD-10-CM

## 2018-08-22 DIAGNOSIS — E7800 Pure hypercholesterolemia, unspecified: Secondary | ICD-10-CM

## 2018-08-22 DIAGNOSIS — Z124 Encounter for screening for malignant neoplasm of cervix: Secondary | ICD-10-CM

## 2018-08-22 DIAGNOSIS — Z0001 Encounter for general adult medical examination with abnormal findings: Secondary | ICD-10-CM | POA: Diagnosis not present

## 2018-08-22 NOTE — Progress Notes (Signed)
Patient ID: Courtney Donovan, female   DOB: 08/23/1960, 58 y.o.   MRN: [id number]   Subjective:    Patient ID: Courtney Donovan, female    DOB: 1960-10-24, 58 y.o.   MRN: [id number]  HPI  Patient here for her physical exam.  She reports she is doing relatively well.  Seeing neurology for headaches.  Last evaluated 08/15/18.  Not taking amitriptyline.  Plans to start.  Has baclofen and tizanidine.  Discussed not taking both.  Did aggravate her back yesterday. States she bent down to pick up something and her back cought - started having pain.  Hurts with certain movements.  No pain radiating down leg.  No numbness or tingling.  Tries to stay active. Working. Handling stress.  No chest pain.  No sob.  No acid reflux.  No abdominal pain.  Bowels moving.   Does report some vaginal irritation.     Past Medical History:  Diagnosis Date  . Allergy   . Anemia   . Chronic headaches   . Diabetes mellitus ([place])    diet controlled  . Hypercholesterolemia   . Hypertension    Past Surgical History:  Procedure Laterality Date  . ABDOMINAL HYSTERECTOMY  2011   supracervical hysterectomy  . TONSILECTOMY/ADENOIDECTOMY WITH MYRINGOTOMY  1067  . TUBAL LIGATION  1996   Family History  Problem Relation Age of Onset  . Hypertension Mother   . Cancer Father        lung  . Hypertension Father    Social History   Socioeconomic History  . Marital status: Married    Spouse name: Not on file  . Number of children: 2  . Years of education: Not on file  . Highest education level: Not on file  Occupational History    Employer: [employer]  Social Needs  . Financial resource strain: Not on file  . Food insecurity    Worry: Not on file    Inability: Not on file  . Transportation needs    Medical: Not on file    Non-medical: Not on file  Tobacco Use  . Smoking status: Never Smoker  . Smokeless tobacco: Never Used  Substance and Sexual Activity  . Alcohol use: No    Alcohol/week:  0.0 standard drinks  . Drug use: No  . Sexual activity: Not on file  Lifestyle  . Physical activity    Days per week: Not on file    Minutes per session: Not on file  . Stress: Not on file  Relationships  . Social Herbalist on phone: Not on file    Gets together: Not on file    Attends religious service: Not on file    Active member of club or organization: Not on file    Attends meetings of clubs or organizations: Not on file    Relationship status: Not on file  Other Topics Concern  . Not on file  Social History Narrative  . Not on file    Outpatient Encounter Medications as of 08/22/2018  Medication Sig  . amitriptyline (ELAVIL) 25 MG tablet Take by mouth.  Marland Kitchen aspirin 81 MG chewable tablet Chew 81 mg by mouth daily.  . baclofen (LIORESAL) 10 MG tablet Take by mouth.  . busPIRone (BUSPAR) 5 MG tablet Take 1 tablet (5 mg total) by mouth 2 (two) times daily as needed.  . fluticasone (FLONASE) 50 MCG/ACT nasal spray Place 2 sprays into both nostrils daily.  . Multiple  Vitamin (MULTIVITAMIN) tablet Take 1 tablet by mouth daily.  Marland Kitchen omeprazole (PRILOSEC) 40 MG capsule Take 1 capsule (40 mg total) by mouth daily.  . rosuvastatin (CRESTOR) 10 MG tablet Take 1 tablet (10 mg total) by mouth daily.  . sucralfate (CARAFATE) 1 g tablet Take 1 tablet (1 g total) by mouth 3 (three) times daily before meals.  . [DISCONTINUED] ergocalciferol (VITAMIN D2) 50000 units capsule Take 1 capsule (50,000 Units total) by mouth once a week.  . [DISCONTINUED] sertraline (ZOLOFT) 25 MG tablet Take 1 tablet (25 mg total) by mouth daily.   No facility-administered encounter medications on file as of 08/22/2018.     Review of Systems  Constitutional: Negative for appetite change and unexpected weight change.  HENT: Negative for congestion and sinus pressure.   Eyes: Negative for pain and visual disturbance.  Respiratory: Negative for cough, chest tightness and shortness of breath.    Cardiovascular: Negative for chest pain, palpitations and leg swelling.  Gastrointestinal: Negative for abdominal pain, diarrhea, nausea and vomiting.  Genitourinary: Negative for difficulty urinating and dysuria.       Vaginal irritation.    Musculoskeletal: Positive for back pain. Negative for joint swelling and myalgias.  Skin: Negative for color change and rash.  Neurological: Positive for headaches. Negative for dizziness and light-headedness.  Hematological: Negative for adenopathy. Does not bruise/bleed easily.  Psychiatric/Behavioral: Negative for agitation and dysphoric mood.       Objective:    Physical Exam Constitutional:      General: She is not in acute distress.    Appearance: Normal appearance. She is well-developed.  HENT:     Right Ear: External ear normal.     Left Ear: External ear normal.  Eyes:     General: No scleral icterus.       Right eye: No discharge.        Left eye: No discharge.     Conjunctiva/sclera: Conjunctivae normal.  Neck:     Musculoskeletal: Neck supple. No muscular tenderness.     Thyroid: No thyromegaly.  Cardiovascular:     Rate and Rhythm: Normal rate and regular rhythm.  Pulmonary:     Effort: No tachypnea, accessory muscle usage or respiratory distress.     Breath sounds: Normal breath sounds. No decreased breath sounds or wheezing.  Chest:     Breasts:        Right: No inverted nipple, mass, nipple discharge or tenderness (no axillary adenopathy).        Left: No inverted nipple, mass, nipple discharge or tenderness (no axilarry adenopathy).  Abdominal:     General: Bowel sounds are normal.     Palpations: Abdomen is soft.     Tenderness: There is no abdominal tenderness.  Genitourinary:    Comments: Normal external genitalia.  Vaginal vault without lesions.  Pap smear performed.  Could not appreciate any adnexal masses or tenderness.  KOH/wet prep obtained.   Musculoskeletal:        General: No swelling or tenderness.   Lymphadenopathy:     Cervical: No cervical adenopathy.  Skin:    Findings: No erythema or rash.  Neurological:     Mental Status: She is alert and oriented to person, place, and time.  Psychiatric:        Mood and Affect: Mood normal.        Behavior: Behavior normal.     BP 126/70   Pulse 79   Temp (!) 97.1 F (36.2 C) (Temporal)  Resp 16   Ht 4' 11" (1.499 m)   Wt 179 lb (81.2 kg)   SpO2 99%   BMI 36.15 kg/m  Wt Readings from Last 3 Encounters:  08/22/18 179 lb (81.2 kg)  03/29/18 181 lb 3.2 oz (82.2 kg)  02/14/18 179 lb 3.2 oz (81.3 kg)     Lab Results  Component Value Date   WBC 6.3 02/14/2018   HGB 12.4 02/14/2018   HCT 37.4 02/14/2018   PLT 220.0 02/14/2018   GLUCOSE 120 (H) 08/20/2018   CHOL 185 08/20/2018   TRIG 101.0 08/20/2018   HDL 43.60 08/20/2018   LDLDIRECT 159.5 01/16/2013   LDLCALC 121 (H) 08/20/2018   ALT 10 08/20/2018   AST 11 08/20/2018   NA 139 08/20/2018   K 4.1 08/20/2018   CL 104 08/20/2018   CREATININE 0.85 08/20/2018   BUN 14 08/20/2018   CO2 29 08/20/2018   TSH 1.00 02/14/2018   HGBA1C 6.8 (H) 08/20/2018   MICROALBUR <0.7 08/20/2018       Assessment & Plan:   Problem List Items Addressed This Visit    Anemia    Follow cbc.       Diabetes mellitus ([place])    Discussed diet and exercise.  Follow met b and a1c.        GERD (gastroesophageal reflux disease)    No upper symptoms reported. Controlled.       Headache    Followed by neurology.        Relevant Medications   amitriptyline (ELAVIL) 25 MG tablet   baclofen (LIORESAL) 10 MG tablet   Health care maintenance    Physical today 08/22/18.  PAP 08/22/18.  Mammogram scheduled for 09/12/18.  Colonoscopy 11/2012.  Recommended f/u colonoscopy in 10 years.        Hypercholesterolemia    On crestor.  Low cholesterol diet and exercise.  Follow lipid panel and liver function tests.        Hypertension    Blood pressure under good control.  Continue same medication  regimen.  Follow pressures.  Follow metabolic panel.        Stress    Discussed with her today.  Overall handling things relatively well.  Follow.        Vaginal irritation    Exam as outlined.  KOH/wet prep obtained.        Vitamin D deficiency    Follow vitamin D level.         Other Visit Diagnoses    Routine general medical examination at a health care facility    -  Primary   Cervical cancer screening       Relevant Orders   Cytology - PAP( [place]) (Completed)   Vaginal discharge       Relevant Orders   Cervicovaginal ancillary only( [place]) (Completed)       Einar Pheasant, MD

## 2018-08-23 LAB — CYTOLOGY - PAP
Diagnosis: NEGATIVE
HPV: NOT DETECTED

## 2018-08-23 LAB — CERVICOVAGINAL ANCILLARY ONLY
Bacterial vaginitis: POSITIVE — AB
Candida vaginitis: NEGATIVE

## 2018-08-24 ENCOUNTER — Encounter: Payer: Self-pay | Admitting: Internal Medicine

## 2018-08-25 ENCOUNTER — Encounter: Payer: Self-pay | Admitting: Internal Medicine

## 2018-08-25 NOTE — Assessment & Plan Note (Signed)
On crestor.  Low cholesterol diet and exercise.  Follow lipid panel and liver function tests.   

## 2018-08-25 NOTE — Assessment & Plan Note (Signed)
Blood pressure under good control.  Continue same medication regimen.  Follow pressures.  Follow metabolic panel.   

## 2018-08-25 NOTE — Assessment & Plan Note (Signed)
No upper symptoms reported. Controlled.

## 2018-08-25 NOTE — Assessment & Plan Note (Signed)
Follow vitamin D level.  

## 2018-08-25 NOTE — Assessment & Plan Note (Signed)
Follow cbc.  

## 2018-08-25 NOTE — Assessment & Plan Note (Signed)
Physical today 08/22/18.  PAP 08/22/18.  Mammogram scheduled for 09/12/18.  Colonoscopy 11/2012.  Recommended f/u colonoscopy in 10 years.

## 2018-08-25 NOTE — Assessment & Plan Note (Signed)
Exam as outlined.  KOH/wet prep obtained.

## 2018-08-25 NOTE — Assessment & Plan Note (Signed)
Followed by neurology.   

## 2018-08-25 NOTE — Assessment & Plan Note (Signed)
Discussed with her today.  Overall handling things relatively well.  Follow.

## 2018-08-25 NOTE — Assessment & Plan Note (Signed)
Discussed diet and exercise.  Follow met b and a1c.   

## 2018-08-26 ENCOUNTER — Other Ambulatory Visit: Payer: Self-pay | Admitting: Internal Medicine

## 2018-08-26 MED ORDER — CLINDAMYCIN PHOSPHATE 2 % VA CREA: 1.0000 | TOPICAL_CREAM | Freq: Every day | VAGINAL | 0 refills | Status: DC

## 2018-08-26 NOTE — Progress Notes (Signed)
rx sent in for cleocin vaginal cream.

## 2018-08-26 NOTE — Telephone Encounter (Signed)
Called pt and discussed G. Vag.  Notified cleocin cream called in.  Questions answered.

## 2018-08-29 ENCOUNTER — Telehealth: Payer: Self-pay

## 2018-08-29 NOTE — Telephone Encounter (Signed)
Copied from [place] [id number]. Topic: General - Inquiry >> Aug 29, 2018  3:53 PM Courtney Donovan wrote: Reason for CRM:   Pt states that she got a text from her pharmacy that a medicine is ready for pick up but it is going to be $40.  Pt wants to know what medication was called in for her, why it was called in, and if there is an alternative medication that she can have.

## 2018-08-30 NOTE — Telephone Encounter (Signed)
Pt states she missed a call from [place] and is calling back regarding her medication questions.

## 2018-08-30 NOTE — Telephone Encounter (Signed)
Called pt to let her know that her clindamycin gel is what was sent in. Pt is going to see if it is cheaper at another pharmacy, wanted to make sure the generic was sent in

## 2018-08-31 ENCOUNTER — Other Ambulatory Visit: Payer: Self-pay | Admitting: Internal Medicine

## 2018-09-12 ENCOUNTER — Ambulatory Visit
Admission: RE | Admit: 2018-09-12 | Discharge: 2018-09-12 | Disposition: A | Discharge status: Home / Self Care | Payer: PRIVATE HEALTH INSURANCE | Source: Ambulatory Visit | Attending: Internal Medicine | Admitting: Internal Medicine

## 2018-09-12 DIAGNOSIS — Z1231 Encounter for screening mammogram for malignant neoplasm of breast: Secondary | ICD-10-CM | POA: Diagnosis not present

## 2018-10-23 ENCOUNTER — Other Ambulatory Visit: Payer: Self-pay | Admitting: Internal Medicine

## 2018-10-25 ENCOUNTER — Ambulatory Visit: Payer: 59 | Admitting: Internal Medicine

## 2019-02-26 ENCOUNTER — Other Ambulatory Visit: Payer: Self-pay

## 2019-02-26 ENCOUNTER — Encounter (HOSPITAL_COMMUNITY): Payer: Self-pay

## 2019-02-26 ENCOUNTER — Ambulatory Visit (HOSPITAL_COMMUNITY)
Admission: EM | Admit: 2019-02-26 | Discharge: 2019-02-26 | Disposition: A | Discharge status: Home / Self Care | Payer: No Typology Code available for payment source | Attending: Family Medicine | Admitting: Family Medicine

## 2019-02-26 DIAGNOSIS — R030 Elevated blood-pressure reading, without diagnosis of hypertension: Secondary | ICD-10-CM

## 2019-02-26 DIAGNOSIS — H1132 Conjunctival hemorrhage, left eye: Secondary | ICD-10-CM | POA: Diagnosis not present

## 2019-02-26 NOTE — Discharge Instructions (Addendum)
Your blood pressure was noted to be elevated during your visit today. You may return here within the next few days to recheck if unable to see your primary care doctor. If your blood pressure remains persistently elevated, you may need to begin taking a medication.  BP (!) 181/85 (BP Location: Right Arm)    Pulse 70    Temp 97.8 F (36.6 C) (Oral)    Resp 17    SpO2 100%

## 2019-02-26 NOTE — ED Triage Notes (Signed)
Pt presents with clot on on left eye since yesterday with no complaints of pain.

## 2019-02-27 NOTE — ED Provider Notes (Signed)
[facility]   [id number] 02/26/19 Arrival Time: [id number]  ASSESSMENT & PLAN:  1. Subconjunctival hemorrhage, left   2. Elevated blood pressure reading     See AVS for written d/c information.    Discharge Instructions     Your blood pressure was noted to be elevated during your visit today. You may return here within the next few days to recheck if unable to see your primary care doctor. If your blood pressure remains persistently elevated, you may need to begin taking a medication.  BP (!) 181/85 (BP Location: Right Arm)   Pulse 70   Temp 97.8 F (36.6 C) (Oral)   Resp 17   SpO2 100%       She has an eye doctor she can see should she develop any eye symptoms. Discussed.  Reviewed expectations re: course of current medical issues. Questions answered. Outlined signs and symptoms indicating need for more acute intervention. Patient verbalized understanding. After Visit Summary given.   SUBJECTIVE:  Courtney Donovan is a 59 y.o. female who presents with complaint of "seeing blood in my left eye" upon waking this morning. Feeling well. No h/o similar reported. Injury: no. Visual changes: no. Contact lens use: no. Recent illness: no. Self treatment: none  Increased blood pressure noted today. Reports that she has not been treated for hypertension in the past. She reports no chest pain on exertion, no dyspnea on exertion, no swelling of ankles, no orthostatic dizziness or lightheadedness, no orthopnea or paroxysmal nocturnal dyspnea, no palpitations and no intermittent claudication symptoms. .  OBJECTIVE:  Vitals:   02/26/19 1748  BP: (!) 181/85  Pulse: 70  Resp: 17  Temp: 97.8 F (36.6 C)  TempSrc: Oral  SpO2: 100%    General appearance: alert; no distress HEENT: [place]; AT; PERRLA; no restriction of the extraocular movements OS: small superior medial subconjunctival hemorrhage; otherwise normal Neck: supple without LAD Lungs: clear to  auscultation bilaterally; unlabored respirations Heart: regular rate and rhythm Skin: warm and dry Psychological: alert and cooperative; normal mood and affect     Allergies  Allergen Reactions  . Metrogel [Metronidazole]   . Pravastatin     Other reaction(s): Muscle Pain CANT TAKE MYALGIAS    Past Medical History:  Diagnosis Date  . Allergy   . Anemia   . Chronic headaches   . Diabetes mellitus ([place])    diet controlled  . Hypercholesterolemia   . Hypertension    Social History   Socioeconomic History  . Marital status: Married    Spouse name: Not on file  . Number of children: 2  . Years of education: Not on file  . Highest education level: Not on file  Occupational History    Employer: [employer]  Tobacco Use  . Smoking status: Never Smoker  . Smokeless tobacco: Never Used  Substance and Sexual Activity  . Alcohol use: No    Alcohol/week: 0.0 standard drinks  . Drug use: No  . Sexual activity: Not on file  Other Topics Concern  . Not on file  Social History Narrative  . Not on file   Social Determinants of Health   Financial Resource Strain:   . Difficulty of Paying Living Expenses: Not on file  Food Insecurity:   . Worried About Charity fundraiser in the Last Year: Not on file  . Ran Out of Food in the Last Year: Not on file  Transportation Needs:   . Lack of Transportation (Medical): Not on  file  . Lack of Transportation (Non-Medical): Not on file  Physical Activity:   . Days of Exercise per Week: Not on file  . Minutes of Exercise per Session: Not on file  Stress:   . Feeling of Stress : Not on file  Social Connections:   . Frequency of Communication with Friends and Family: Not on file  . Frequency of Social Gatherings with Friends and Family: Not on file  . Attends Religious Services: Not on file  . Active Member of Clubs or Organizations: Not on file  . Attends Archivist Meetings: Not on file  . Marital Status: Not on file  Intimate  Partner Violence:   . Fear of Current or Ex-Partner: Not on file  . Emotionally Abused: Not on file  . Physically Abused: Not on file  . Sexually Abused: Not on file   Family History  Problem Relation Age of Onset  . Hypertension Mother   . Cancer Father        lung  . Hypertension Father    Past Surgical History:  Procedure Laterality Date  . ABDOMINAL HYSTERECTOMY  2011   supracervical hysterectomy  . TONSILECTOMY/ADENOIDECTOMY WITH MYRINGOTOMY  1067  . TUBAL LIGATION  1996     Vanessa Kick, MD 02/27/19 1032

## 2019-03-07 ENCOUNTER — Encounter: Payer: Self-pay | Admitting: Internal Medicine

## 2019-03-07 ENCOUNTER — Telehealth: Payer: Self-pay | Admitting: Internal Medicine

## 2019-03-07 NOTE — Telephone Encounter (Signed)
Pt wanted to let PCP know that her bp was 146/93 this morning and this afternoon, it is 139/96. She wants to know if she should take bp meds. Please advise.

## 2019-03-07 NOTE — Telephone Encounter (Signed)
See phone note for further documentation

## 2019-03-07 NOTE — Telephone Encounter (Signed)
Called pt confirmed doing ok. Advised to continue to monitor pressures and we will do virtual visit on Friday. Patient was advised if she develops any acute symptoms she should not wait to be evaluated.

## 2019-03-09 ENCOUNTER — Encounter (HOSPITAL_COMMUNITY): Payer: Self-pay | Admitting: Emergency Medicine

## 2019-03-09 ENCOUNTER — Ambulatory Visit (HOSPITAL_COMMUNITY)
Admission: EM | Admit: 2019-03-09 | Discharge: 2019-03-09 | Disposition: A | Discharge status: Home / Self Care | Payer: No Typology Code available for payment source | Attending: Urgent Care | Admitting: Urgent Care

## 2019-03-09 ENCOUNTER — Other Ambulatory Visit: Payer: Self-pay

## 2019-03-09 DIAGNOSIS — R519 Headache, unspecified: Secondary | ICD-10-CM | POA: Diagnosis not present

## 2019-03-09 DIAGNOSIS — R030 Elevated blood-pressure reading, without diagnosis of hypertension: Secondary | ICD-10-CM

## 2019-03-09 DIAGNOSIS — Z8679 Personal history of other diseases of the circulatory system: Secondary | ICD-10-CM

## 2019-03-09 DIAGNOSIS — G8929 Other chronic pain: Secondary | ICD-10-CM | POA: Diagnosis not present

## 2019-03-09 MED ORDER — AMLODIPINE BESYLATE 5 MG PO TABS: 5.0000 mg | ORAL_TABLET | Freq: Every day | ORAL | 0 refills | Status: DC

## 2019-03-09 NOTE — ED Provider Notes (Signed)
[facility]   MRN: [id number] DOB: 09/06/1960  Subjective:   Courtney Donovan is a 58 y.o. female presenting for 5-day history of persistent tension headaches over frontal forehead.  Patient has been checking her blood pressure and is back up in the [id number] and [id number] systolic.  She has a history of hypertension but cannot recall the name of the medication that she was on.  Has a history of chronic headaches and is being managed by neurology for this.  No current facility-administered medications for this encounter.  Current Outpatient Medications:  .  amitriptyline (ELAVIL) 25 MG tablet, Take by mouth., Disp: , Rfl:  .  aspirin 81 MG chewable tablet, Chew 81 mg by mouth daily., Disp: , Rfl:  .  baclofen (LIORESAL) 10 MG tablet, Take by mouth., Disp: , Rfl:  .  fluticasone (FLONASE) 50 MCG/ACT nasal spray, Place 2 sprays into both nostrils daily., Disp: 48 g, Rfl: 3 .  Multiple Vitamin (MULTIVITAMIN) tablet, Take 1 tablet by mouth daily., Disp: , Rfl:  .  omeprazole (PRILOSEC) 40 MG capsule, TAKE 1 CAPSULE BY MOUTH  DAILY, Disp: 90 capsule, Rfl: 3 .  sucralfate (CARAFATE) 1 g tablet, TAKE 1 TABLET BY MOUTH THREE TIMES DAILY BEFORE  MEALS, Disp: 90 tablet, Rfl: 0 .  busPIRone (BUSPAR) 5 MG tablet, Take 1 tablet (5 mg total) by mouth 2 (two) times daily as needed., Disp: 60 tablet, Rfl: 0 .  clindamycin (CLEOCIN) 2 % vaginal cream, Place 1 Applicatorful vaginally at bedtime. For 7 nights, Disp: 40 g, Rfl: 0 .  rosuvastatin (CRESTOR) 10 MG tablet, Take 1 tablet (10 mg total) by mouth daily., Disp: 90 tablet, Rfl: 1   Allergies  Allergen Reactions  . Metrogel [Metronidazole]   . Pravastatin     Other reaction(s): Muscle Pain CANT TAKE MYALGIAS    Past Medical History:  Diagnosis Date  . Allergy   . Anemia   . Chronic headaches   . Diabetes mellitus ([place])    diet controlled  . Hypercholesterolemia   . Hypertension      Past Surgical History:  Procedure  Laterality Date  . ABDOMINAL HYSTERECTOMY  2011   supracervical hysterectomy  . TONSILECTOMY/ADENOIDECTOMY WITH MYRINGOTOMY  1067  . TONSILLECTOMY    . TUBAL LIGATION  1996    Family History  Problem Relation Age of Onset  . Hypertension Mother   . Cancer Father        lung  . Hypertension Father     Social History   Tobacco Use  . Smoking status: Never Smoker  . Smokeless tobacco: Never Used  Substance Use Topics  . Alcohol use: No    Alcohol/week: 0.0 standard drinks  . Drug use: No    ROS   Objective:   Vitals: BP (!) 150/72 (BP Location: Left Arm)   Pulse 85   Temp 98.9 F (37.2 C) (Oral)   Resp 18   SpO2 100%   Recheck by PA-Sansa Alkema was 150/75.   Wt Readings from Last 3 Encounters:  08/22/18 179 lb (81.2 kg)  03/29/18 181 lb 3.2 oz (82.2 kg)  02/14/18 179 lb 3.2 oz (81.3 kg)   Temp Readings from Last 3 Encounters:  03/09/19 98.9 F (37.2 C) (Oral)  02/26/19 97.8 F (36.6 C) (Oral)  08/22/18 (!) 97.1 F (36.2 C) (Temporal)   BP Readings from Last 3 Encounters:  03/09/19 (!) 150/72  02/26/19 (!) 181/85  08/22/18 126/70   Pulse Readings from Last 3  Encounters:  03/09/19 85  02/26/19 70  08/22/18 79   Physical Exam Constitutional:      General: She is not in acute distress.    Appearance: Normal appearance. She is well-developed. She is not ill-appearing, toxic-appearing or diaphoretic.  HENT:     Head: Normocephalic and atraumatic.     Nose: Nose normal.     Mouth/Throat:     Mouth: Mucous membranes are moist.  Eyes:     Extraocular Movements: Extraocular movements intact.     Pupils: Pupils are equal, round, and reactive to light.  Cardiovascular:     Rate and Rhythm: Normal rate and regular rhythm.     Pulses: Normal pulses.     Heart sounds: Normal heart sounds. No murmur. No friction rub. No gallop.   Pulmonary:     Effort: Pulmonary effort is normal. No respiratory distress.     Breath sounds: Normal breath sounds. No stridor. No  wheezing, rhonchi or rales.  Skin:    General: Skin is warm and dry.     Findings: No rash.  Neurological:     Mental Status: She is alert and oriented to person, place, and time.     Cranial Nerves: No cranial nerve deficit.     Motor: No weakness.     Coordination: Romberg sign negative. Coordination normal.     Gait: Gait normal.     Deep Tendon Reflexes: Reflexes normal.  Psychiatric:        Mood and Affect: Mood normal.        Speech: Speech normal.        Behavior: Behavior normal.        Thought Content: Thought content normal.    Assessment and Plan :   1. Chronic nonintractable headache, unspecified headache type   2. Elevated blood pressure reading   3. History of essential hypertension     Patient does not have signs of neurologic emergency, stroke.  Counseled on dietary modifications.  Will restart a blood pressure medication and amlodipine for her at 5 mg.  Patient does have a virtual visit coming up with her neurologist.  Recommended she follow-up with them regarding her headaches and continue to monitor her blood pressure. Counseled patient on potential for adverse effects with medications prescribed/recommended today, ER and return-to-clinic precautions discussed, patient verbalized understanding.    Jaynee Eagles, PA-C 03/09/19 1729

## 2019-03-09 NOTE — ED Triage Notes (Addendum)
headache started Tuesday 03/04/2019.  Patient reports she has a history of headaches.  This does not feel like her migraines.  This feels like a band around her head.  Slight photosensitivity noticed on her way to ucc  Patient concerned for blood pressure: 148/103 was an am reading today 143/99 at noon today

## 2019-03-09 NOTE — Discharge Instructions (Addendum)
For diabetes, please make sure you are avoiding starchy, carbohydrate foods like pasta, breads, pastry, rice, potatoes, desserts. These foods can elevated your blood sugar. Also, limit your alcohol drinking to 1 per day, avoid sodas, sweet teas. For elevated blood pressure, make sure you are monitoring salt in your diet.  Do not eat restaurant foods and limit processed foods at home.  Processed foods include things like frozen meals, pre-seasoned meats and dinners, pre-packaged meals, deli meats, canned foods.  Make sure your pain attention to sodium labels on foods you by at the grocery store.  For seasoning you can use a brand called Mrs. Dash which includes a lot of salt free seasonings.  Salads - kale, spinach, cabbage, spring mix; use seeds like pumpkin seeds or sunflower seeds, almonds; you can also use 1-2 hard boiled eggs in your salads Fruits - avocadoes, berries (blueberries, raspberries, blackberries), apples, oranges Vegetables - aspargus, cauliflower, broccoli, green beans, brussel spouts, bell peppers; stay away from starchy vegetables like potatoes, carrots, peas  Regarding meat it is better to eat lean meats and limit your red meat consumption including pork.  Wild caught fish, chicken breast are good options.  Do not eat any foods on this list that you are allergic to.

## 2019-03-14 ENCOUNTER — Encounter: Payer: Self-pay | Admitting: Internal Medicine

## 2019-03-14 ENCOUNTER — Telehealth (INDEPENDENT_AMBULATORY_CARE_PROVIDER_SITE_OTHER): Payer: No Typology Code available for payment source | Admitting: Internal Medicine

## 2019-03-14 DIAGNOSIS — R519 Headache, unspecified: Secondary | ICD-10-CM

## 2019-03-14 DIAGNOSIS — K219 Gastro-esophageal reflux disease without esophagitis: Secondary | ICD-10-CM

## 2019-03-14 DIAGNOSIS — I10 Essential (primary) hypertension: Secondary | ICD-10-CM

## 2019-03-14 DIAGNOSIS — E119 Type 2 diabetes mellitus without complications: Secondary | ICD-10-CM

## 2019-03-14 DIAGNOSIS — D649 Anemia, unspecified: Secondary | ICD-10-CM | POA: Diagnosis not present

## 2019-03-14 DIAGNOSIS — E7800 Pure hypercholesterolemia, unspecified: Secondary | ICD-10-CM

## 2019-03-14 DIAGNOSIS — F439 Reaction to severe stress, unspecified: Secondary | ICD-10-CM

## 2019-03-14 NOTE — Progress Notes (Signed)
Patient ID: Courtney Donovan, female   DOB: 1960-06-23, 59 y.o.   MRN: [id number]   Virtual Visit via video Note  This visit type was conducted due to national recommendations for restrictions regarding the COVID-19 pandemic (e.g. social distancing).  This format is felt to be most appropriate for this patient at this time.  All issues noted in this document were discussed and addressed.  No physical exam was performed (except for noted visual exam findings with Video Visits).   I connected with [name] by a video enabled telemedicine application and verified that I am speaking with the correct person using two identifiers. Location patient: home Location provider: work  Persons participating in the virtual visit: patient, provider  The limitations, risks, security and privacy concerns of performing an evaluation and management service by video and the availability of in person appointments have been discussed.  The patient expressed understanding and agreed to proceed.   Reason for visit: work in appt  HPI: She was evaluated 03/09/19 for headaches and elevated blood pressure - Urgent Care.  Was started on amlodipine 65m q day.  Reports blood pressure is better.  States she is out of her training at work.  Increased stress related to the training.  Now that she is out, feels will be better.  Sees neurology for headaches.  Recommended for her to start on amitriptyline.  States was not taking.  Uses tylenol for headaches.  No chest pain or tightness reported.  No abdominal pain or bowel change reported.  Taking amlodipine.  Blood pressure 143/76.  Did have what sounds like a conjunctival hemorrhage.  No pain.  Has f/u planned with ophthalmology next week.  Discussed diet and exercise.     ROS: See pertinent positives and negatives per HPI.  Past Medical History:  Diagnosis Date  . Allergy   . Anemia   . Chronic headaches   . Diabetes mellitus ([place]    diet  controlled  . Hypercholesterolemia   . Hypertension     Past Surgical History:  Procedure Laterality Date  . ABDOMINAL HYSTERECTOMY  2011   supracervical hysterectomy  . TONSILECTOMY/ADENOIDECTOMY WITH MYRINGOTOMY  1067  . TONSILLECTOMY    . TUBAL LIGATION  1996    Family History  Problem Relation Age of Onset  . Hypertension Mother   . Cancer Father        lung  . Hypertension Father     SOCIAL HX: reviewed.    Current Outpatient Medications:  .  amitriptyline (ELAVIL) 25 MG tablet, Take by mouth., Disp: , Rfl:  .  amLODipine (NORVASC) 5 MG tablet, Take 1 tablet (5 mg total) by mouth daily., Disp: 30 tablet, Rfl: 0 .  aspirin 81 MG chewable tablet, Chew 81 mg by mouth daily., Disp: , Rfl:  .  fluticasone (FLONASE) 50 MCG/ACT nasal spray, Place 2 sprays into both nostrils daily., Disp: 48 g, Rfl: 3 .  Multiple Vitamin (MULTIVITAMIN) tablet, Take 1 tablet by mouth daily., Disp: , Rfl:  .  omeprazole (PRILOSEC) 40 MG capsule, TAKE 1 CAPSULE BY MOUTH  DAILY, Disp: 90 capsule, Rfl: 3 .  rosuvastatin (CRESTOR) 10 MG tablet, Take 1 tablet (10 mg total) by mouth daily., Disp: 90 tablet, Rfl: 1 .  sucralfate (CARAFATE) 1 g tablet, TAKE 1 TABLET BY MOUTH THREE TIMES DAILY BEFORE  MEALS, Disp: 90 tablet, Rfl: 0 .  baclofen (LIORESAL) 10 MG tablet, Take by mouth., Disp: , Rfl:  .  busPIRone (BUSPAR)  5 MG tablet, Take 1 tablet (5 mg total) by mouth 2 (two) times daily as needed. (Patient not taking: Reported on 03/14/2019), Disp: 60 tablet, Rfl: 0 .  clindamycin (CLEOCIN) 2 % vaginal cream, Place 1 Applicatorful vaginally at bedtime. For 7 nights (Patient not taking: Reported on 03/14/2019), Disp: 40 g, Rfl: 0  EXAM:  VITALS per patient if applicable: 157/26  GENERAL: alert, oriented, appears well and in no acute distress  HEENT: atraumatic, conjunttiva clear, no obvious abnormalities on inspection of external nose and ears  NECK: normal movements of the head and neck  LUNGS: on  inspection no signs of respiratory distress, breathing rate appears normal, no obvious gross SOB, gasping or wheezing  CV: no obvious cyanosis  PSYCH/NEURO: pleasant and cooperative, no obvious depression or anxiety, speech and thought processing grossly intact  ASSESSMENT AND PLAN:  Discussed the following assessment and plan:  Anemia Follow cbc.    Diabetes mellitus Low carb diet and exercise.  Follow met b and a1c.    GERD (gastroesophageal reflux disease) On omeprazole.  Upper symptoms controlled.    Headache Being followed by neurology.  Recommended starting amitriptyline.  Headaches are better.  Follow.    Hypercholesterolemia On crestor.  Low cholesterol diet and exercise.  Follow lipid panel and liver function tests.    Hypertension On amlodipine.  Blood pressures as outlined.  Doing better.  Follow pressures.  Follow metabolic panel.   Stress Increased stress with work/training.  Training is over now.  Stress seems to be better.  Follow.     Orders Placed This Encounter  Procedures  . CBC with Differential/Platelet    Standing Status:   Future    Standing Expiration Date:   03/21/2020  . Hemoglobin A1c    Standing Status:   Future    Standing Expiration Date:   03/21/2020  . Hepatic function panel    Standing Status:   Future    Standing Expiration Date:   03/21/2020  . Lipid panel    Standing Status:   Future    Standing Expiration Date:   03/21/2020  . TSH    Standing Status:   Future    Standing Expiration Date:   03/21/2020  . Basic metabolic panel    Standing Status:   Future    Standing Expiration Date:   03/21/2020     I discussed the assessment and treatment plan with the patient. The patient was provided an opportunity to ask questions and all were answered. The patient agreed with the plan and demonstrated an understanding of the instructions.   The patient was advised to call back or seek an in-person evaluation if the symptoms worsen or if the  condition fails to improve as anticipated.    Einar Pheasant, MD

## 2019-03-14 NOTE — Progress Notes (Signed)
BP 2/27 = 142/96 2/28 = 150/72 3/2 = 138/83 3/3 = 120/83 3/4 = 137/86 No BP today as patient just took meds hen call started.

## 2019-03-22 NOTE — Assessment & Plan Note (Signed)
Being followed by neurology.  Recommended starting amitriptyline.  Headaches are better.  Follow.

## 2019-03-22 NOTE — Assessment & Plan Note (Signed)
On amlodipine.  Blood pressures as outlined.  Doing better.  Follow pressures.  Follow metabolic panel.

## 2019-03-22 NOTE — Assessment & Plan Note (Signed)
Follow cbc.  

## 2019-03-22 NOTE — Assessment & Plan Note (Signed)
Increased stress with work/training.  Training is over now.  Stress seems to be better.  Follow.

## 2019-03-22 NOTE — Assessment & Plan Note (Signed)
Low carb diet and exercise.  Follow met b and a1c.

## 2019-03-22 NOTE — Assessment & Plan Note (Signed)
On omeprazole.  Upper symptoms controlled.   

## 2019-03-22 NOTE — Assessment & Plan Note (Signed)
On crestor.  Low cholesterol diet and exercise.  Follow lipid panel and liver function tests.   

## 2019-04-15 ENCOUNTER — Other Ambulatory Visit: Payer: Self-pay

## 2019-04-15 ENCOUNTER — Ambulatory Visit (INDEPENDENT_AMBULATORY_CARE_PROVIDER_SITE_OTHER): Payer: No Typology Code available for payment source | Admitting: Internal Medicine

## 2019-04-15 DIAGNOSIS — D649 Anemia, unspecified: Secondary | ICD-10-CM

## 2019-04-15 DIAGNOSIS — E119 Type 2 diabetes mellitus without complications: Secondary | ICD-10-CM | POA: Diagnosis not present

## 2019-04-15 DIAGNOSIS — R519 Headache, unspecified: Secondary | ICD-10-CM | POA: Diagnosis not present

## 2019-04-15 DIAGNOSIS — K219 Gastro-esophageal reflux disease without esophagitis: Secondary | ICD-10-CM | POA: Diagnosis not present

## 2019-04-15 DIAGNOSIS — F439 Reaction to severe stress, unspecified: Secondary | ICD-10-CM

## 2019-04-15 DIAGNOSIS — E7800 Pure hypercholesterolemia, unspecified: Secondary | ICD-10-CM

## 2019-04-15 DIAGNOSIS — E559 Vitamin D deficiency, unspecified: Secondary | ICD-10-CM

## 2019-04-15 DIAGNOSIS — I10 Essential (primary) hypertension: Secondary | ICD-10-CM

## 2019-04-15 NOTE — Progress Notes (Signed)
Patient ID: Courtney Donovan, female   DOB: 11/09/1960, 59 y.o.   MRN: [id number]   Subjective:    Patient ID: Courtney Donovan, female    DOB: [birth year], 59 y.o.   MRN: [id number]  HPI This visit occurred during the SARS-CoV-2 public health emergency.  Safety protocols were in place, including screening questions prior to the visit, additional usage of staff PPE, and extensive cleaning of exam room while observing appropriate contact time as indicated for disinfecting solutions.  Patient here for a scheduled follow up.  She reports headaches are better.  Saw neurology 03/25/19 - diagnosed with migraine.  Started on amitriptyline.  Discussed ubrelvy.  Also, per note, discussed bilateral occipital neuralgia.  Discussed PT and stretches.  No chest pain or sob.  No acid reflux reported.  Some pelvic discomfort.  Discussed pelvic floor exercises.  Taking amlodipine for her blood pressure.    Past Medical History:  Diagnosis Date  . Allergy   . Anemia   . Chronic headaches   . Diabetes mellitus ([place])    diet controlled  . Hypercholesterolemia   . Hypertension    Past Surgical History:  Procedure Laterality Date  . ABDOMINAL HYSTERECTOMY  2011   supracervical hysterectomy  . TONSILECTOMY/ADENOIDECTOMY WITH MYRINGOTOMY  1067  . TONSILLECTOMY    . TUBAL LIGATION  1996   Family History  Problem Relation Age of Onset  . Hypertension Mother   . Cancer Father        lung  . Hypertension Father    Social History   Socioeconomic History  . Marital status: Married    Spouse name: Not on file  . Number of children: 2  . Years of education: Not on file  . Highest education level: Not on file  Occupational History    Employer: [employer]  Tobacco Use  . Smoking status: Never Smoker  . Smokeless tobacco: Never Used  Substance and Sexual Activity  . Alcohol use: No    Alcohol/week: 0.0 standard drinks  . Drug use: No  . Sexual activity: Not on file  Other Topics  Concern  . Not on file  Social History Narrative  . Not on file   Social Determinants of Health   Financial Resource Strain:   . Difficulty of Paying Living Expenses:   Food Insecurity:   . Worried About Charity fundraiser in the Last Year:   . Arboriculturist in the Last Year:   Transportation Needs:   . Film/video editor (Medical):   Marland Kitchen Lack of Transportation (Non-Medical):   Physical Activity:   . Days of Exercise per Week:   . Minutes of Exercise per Session:   Stress:   . Feeling of Stress :   Social Connections:   . Frequency of Communication with Friends and Family:   . Frequency of Social Gatherings with Friends and Family:   . Attends Religious Services:   . Active Member of Clubs or Organizations:   . Attends Archivist Meetings:   Marland Kitchen Marital Status:     Outpatient Encounter Medications as of 04/15/2019  Medication Sig  . amitriptyline (ELAVIL) 25 MG tablet Take by mouth.  Marland Kitchen amLODipine (NORVASC) 5 MG tablet Take 1 tablet (5 mg total) by mouth daily.  Marland Kitchen aspirin 81 MG chewable tablet Chew 81 mg by mouth daily.  . baclofen (LIORESAL) 10 MG tablet Take by mouth.  . fluticasone (FLONASE) 50 MCG/ACT nasal spray Place 2 sprays into  both nostrils daily.  . Multiple Vitamin (MULTIVITAMIN) tablet Take 1 tablet by mouth daily.  Marland Kitchen omeprazole (PRILOSEC) 40 MG capsule TAKE 1 CAPSULE BY MOUTH  DAILY  . rosuvastatin (CRESTOR) 10 MG tablet Take 1 tablet (10 mg total) by mouth daily.  . sucralfate (CARAFATE) 1 g tablet TAKE 1 TABLET BY MOUTH THREE TIMES DAILY BEFORE  MEALS  . [DISCONTINUED] busPIRone (BUSPAR) 5 MG tablet Take 1 tablet (5 mg total) by mouth 2 (two) times daily as needed. (Patient not taking: Reported on 03/14/2019)  . [DISCONTINUED] clindamycin (CLEOCIN) 2 % vaginal cream Place 1 Applicatorful vaginally at bedtime. For 7 nights (Patient not taking: Reported on 03/14/2019)   No facility-administered encounter medications on file as of 04/15/2019.    Review  of Systems  Constitutional: Negative for appetite change and unexpected weight change.  HENT: Negative for congestion and sinus pressure.   Respiratory: Negative for cough, chest tightness and shortness of breath.   Cardiovascular: Negative for chest pain, palpitations and leg swelling.  Gastrointestinal: Negative for abdominal pain, diarrhea, nausea and vomiting.  Genitourinary: Negative for difficulty urinating and dysuria.  Musculoskeletal: Negative for joint swelling and myalgias.  Skin: Negative for color change and rash.  Neurological: Negative for dizziness and light-headedness.       Headaches better.   Psychiatric/Behavioral: Negative for agitation and dysphoric mood.       Objective:    Physical Exam Vitals reviewed.  Constitutional:      General: She is not in acute distress.    Appearance: Normal appearance.  HENT:     Head: Normocephalic and atraumatic.     Right Ear: External ear normal.     Left Ear: External ear normal.  Eyes:     General: No scleral icterus.       Right eye: No discharge.        Left eye: No discharge.     Conjunctiva/sclera: Conjunctivae normal.  Neck:     Thyroid: No thyromegaly.  Cardiovascular:     Rate and Rhythm: Normal rate and regular rhythm.  Pulmonary:     Effort: No respiratory distress.     Breath sounds: Normal breath sounds. No wheezing.  Abdominal:     General: Bowel sounds are normal.     Palpations: Abdomen is soft.     Tenderness: There is no abdominal tenderness.  Musculoskeletal:        General: No swelling or tenderness.     Cervical back: Neck supple. No tenderness.  Lymphadenopathy:     Cervical: No cervical adenopathy.  Skin:    Findings: No erythema or rash.  Neurological:     Mental Status: She is alert.  Psychiatric:        Mood and Affect: Mood normal.        Behavior: Behavior normal.     BP 128/78   Pulse 73   Temp 97.8 F (36.6 C)   Resp 16   Wt 178 lb (80.7 kg)   SpO2 99%   BMI 35.95  kg/m  Wt Readings from Last 3 Encounters:  04/15/19 178 lb (80.7 kg)  03/14/19 179 lb (81.2 kg)  08/22/18 179 lb (81.2 kg)     Lab Results  Component Value Date   WBC 6.3 02/14/2018   HGB 12.4 02/14/2018   HCT 37.4 02/14/2018   PLT 220.0 02/14/2018   GLUCOSE 120 (H) 08/20/2018   CHOL 185 08/20/2018   TRIG 101.0 08/20/2018   HDL 43.60 08/20/2018  LDLDIRECT 159.5 01/16/2013   LDLCALC 121 (H) 08/20/2018   ALT 10 08/20/2018   AST 11 08/20/2018   NA 139 08/20/2018   K 4.1 08/20/2018   CL 104 08/20/2018   CREATININE 0.85 08/20/2018   BUN 14 08/20/2018   CO2 29 08/20/2018   TSH 1.00 02/14/2018   HGBA1C 6.8 (H) 08/20/2018   MICROALBUR <0.7 08/20/2018       Assessment & Plan:   Problem List Items Addressed This Visit    Anemia    Follow cbc.        Diabetes mellitus ([place])    Low carb diet and exercise.  On no medication.  Follow met b and a1c.        GERD (gastroesophageal reflux disease)    On omeprazole.  No upper symptoms reported.        Headache    Headaches are better.  Seeing neurology.       Hypercholesterolemia    On crestor.  Low cholesterol diet and exercise.  Follow lipid panel and liver function tests.        Hypertension    On amlodipine.  Blood pressure as outlined.  Continue current medication.  Follow pressure.  Follow metabolic panel.        Stress    Increased stress with work, etc.  Overall handling things relatively well.  Follow.        Vitamin D deficiency    Follow vitamin d level.           Einar Pheasant, MD

## 2019-04-20 ENCOUNTER — Encounter: Payer: Self-pay | Admitting: Internal Medicine

## 2019-04-20 NOTE — Assessment & Plan Note (Signed)
On crestor.  Low cholesterol diet and exercise.  Follow lipid panel and liver function tests.   

## 2019-04-20 NOTE — Assessment & Plan Note (Signed)
Increased stress with work, etc.  Overall handling things relatively well.  Follow.

## 2019-04-20 NOTE — Assessment & Plan Note (Signed)
On omeprazole.  No upper symptoms reported.   

## 2019-04-20 NOTE — Assessment & Plan Note (Signed)
On amlodipine.  Blood pressure as outlined.  Continue current medication.  Follow pressure.  Follow metabolic panel.

## 2019-04-20 NOTE — Assessment & Plan Note (Signed)
Follow vitamin d level.   

## 2019-04-20 NOTE — Assessment & Plan Note (Signed)
Headaches are better.  Seeing neurology.

## 2019-04-20 NOTE — Assessment & Plan Note (Signed)
Low carb diet and exercise.  On no medication.  Follow met b and a1c.   

## 2019-04-20 NOTE — Assessment & Plan Note (Signed)
Follow cbc.  

## 2019-04-23 ENCOUNTER — Ambulatory Visit
Admission: EM | Admit: 2019-04-23 | Discharge: 2019-04-23 | Disposition: A | Discharge status: Home / Self Care | Payer: No Typology Code available for payment source | Attending: Emergency Medicine | Admitting: Emergency Medicine

## 2019-04-23 ENCOUNTER — Telehealth: Payer: Self-pay | Admitting: Internal Medicine

## 2019-04-23 DIAGNOSIS — R079 Chest pain, unspecified: Secondary | ICD-10-CM | POA: Diagnosis not present

## 2019-04-23 LAB — POCT FASTING CBG KUC MANUAL ENTRY: POCT Glucose (KUC): 118 mg/dL — AB (ref 70–99)

## 2019-04-23 NOTE — ED Triage Notes (Signed)
Pt presents with c/o waking this morning feeling "off", having some increased indigestion and her head feeling "spacy/heavy" and a headache. Pt reports she took her regular meds this morning, Htn and GERD. She states this morning she got a quick ringing in her ears. Pt also reports some left-sided chest pressure earlier today that seems to come and go. Pt took her BP and it was 139/92 at 2:30pm today. Pt did receive 1st dose COVID Pfizer vaccine this past Friday so is not sure if her symptoms are related to this. Pt denies any current chest pain.

## 2019-04-23 NOTE — Telephone Encounter (Addendum)
Patient received first dose of pfizer vaccine on Friday  [facility], and today just feels off slight headache and just feeling off, little indigestion in chest in the middle of chest and to right side, with ringing is ears around 2:20 today. Patient BP currently 139/92 pulse 90  Advised patient with having these symptoms I felt she should be evaluated at [facility] or in office at [facility] patient chose to  Go to [place]. Patient says he is going to [place] her to tell them same things she had described to me. Patient will call back and schedule Follow up with PCP.

## 2019-04-23 NOTE — Telephone Encounter (Signed)
Pt BP today was 139/92 she is not feeling like herself. She has ringing in her ears  Wants to know if she needs to take more medication

## 2019-04-23 NOTE — ED Provider Notes (Signed)
Roderic Palau    CSN: [id number] Arrival date & time: 04/23/19  1544      History   Chief Complaint Chief Complaint  Patient presents with  . Headache  . Hypertension    HPI Janetzy Barut Moshe Cipro is a 59 y.o. female.   Patient presents with intermittent chest pressure today; none currently.  She also reports indigestion, headache, and feeling "foggy headed" today.  She felt like her blood pressure might be elevated; it was 139/92.  She took Tylenol with good relief of all of her symptoms.  She denies focal weakness, speech difficulty, facial asymmetry, numbness, dizziness, shortness of breath, nausea, diaphoresis, or other symptoms.  Patient received her first COVID vaccine on 04/18/2019.    The history is provided by the patient.    Past Medical History:  Diagnosis Date  . Allergy   . Anemia   . Chronic headaches   . Diabetes mellitus ([place])    diet controlled  . Hypercholesterolemia   . Hypertension     Patient Active Problem List   Diagnosis Date Noted  . Stress 04/25/2015  . Vaginal irritation 02/15/2015  . Health care maintenance 10/12/2014  . Fatigue 10/12/2014  . Diarrhea 08/16/2014  . Vitamin D deficiency 04/05/2013  . Rash of neck 04/05/2013  . Headache 01/16/2013  . Menopausal syndrome 10/28/2012  . Postnasal drip 07/01/2012  . Rectal bleeding 07/01/2012  . GERD (gastroesophageal reflux disease) 12/09/2011  . Diabetes mellitus ([place]) 12/09/2011  . Hypertension 12/04/2011  . Anemia 12/04/2011  . Hypercholesterolemia 12/04/2011    Past Surgical History:  Procedure Laterality Date  . ABDOMINAL HYSTERECTOMY  2011   supracervical hysterectomy  . TONSILECTOMY/ADENOIDECTOMY WITH MYRINGOTOMY  1067  . TONSILLECTOMY    . TUBAL LIGATION  1996    OB History   No obstetric history on file.      Home Medications    Prior to Admission medications   Medication Sig Start Date End Date Taking? Authorizing Provider  amitriptyline (ELAVIL) 25  MG tablet Take by mouth. 08/15/18 08/15/19 Yes [provider]  amLODipine (NORVASC) 5 MG tablet Take 1 tablet (5 mg total) by mouth daily. 03/09/19  Yes Jaynee Eagles, PA-C  aspirin 81 MG chewable tablet Chew 81 mg by mouth daily.   Yes [provider]  baclofen (LIORESAL) 10 MG tablet Take by mouth.   Yes [provider]  fluticasone (FLONASE) 50 MCG/ACT nasal spray Place 2 sprays into both nostrils daily. 02/29/16  Yes Einar Pheasant, MD  Multiple Vitamin (MULTIVITAMIN) tablet Take 1 tablet by mouth daily.   Yes [provider]  omeprazole (PRILOSEC) 40 MG capsule TAKE 1 CAPSULE BY MOUTH  DAILY 10/23/18  Yes Einar Pheasant, MD  sucralfate (CARAFATE) 1 g tablet TAKE 1 TABLET BY MOUTH THREE TIMES DAILY BEFORE  MEALS 09/02/18  Yes Einar Pheasant, MD  rosuvastatin (CRESTOR) 10 MG tablet Take 1 tablet (10 mg total) by mouth daily. 02/21/18   Einar Pheasant, MD    Family History Family History  Problem Relation Age of Onset  . Hypertension Mother   . Cancer Father        lung  . Hypertension Father     Social History Social History   Tobacco Use  . Smoking status: Never Smoker  . Smokeless tobacco: Never Used  Substance Use Topics  . Alcohol use: No    Alcohol/week: 0.0 standard drinks  . Drug use: No     Allergies   Atorvastatin, Metrogel [metronidazole],  Pravastatin, and Latex   Review of Systems Review of Systems  Constitutional: Negative for chills and fever.  HENT: Negative for ear pain and sore throat.   Eyes: Negative for pain and visual disturbance.  Respiratory: Negative for cough and shortness of breath.   Cardiovascular: Positive for chest pain. Negative for palpitations.  Gastrointestinal: Negative for abdominal pain, diarrhea, nausea and vomiting.  Genitourinary: Negative for dysuria and hematuria.  Musculoskeletal: Negative for arthralgias and back pain.  Skin: Negative for color change and rash.  Neurological: Positive for  headaches. Negative for dizziness, tremors, seizures, syncope, facial asymmetry, speech difficulty, weakness and numbness.  All other systems reviewed and are negative.    Physical Exam Triage Vital Signs ED Triage Vitals  Enc Vitals Group     BP      Pulse      Resp      Temp      Temp src      SpO2      Weight      Height      Head Circumference      Peak Flow      Pain Score      Pain Loc      Pain Edu?      Excl. in [place]?    No data found.  Updated Vital Signs BP 140/85 (BP Location: Left Arm)   Pulse 66   Temp 98 F (36.7 C) (Oral)   Resp 18   Ht 4\' 11"  (1.499 m)   Wt 176 lb (79.8 kg)   SpO2 98%   BMI 35.55 kg/m   Visual Acuity Right Eye Distance:   Left Eye Distance:   Bilateral Distance:    Right Eye Near:   Left Eye Near:    Bilateral Near:     Physical Exam Vitals and nursing note reviewed.  Constitutional:      General: She is not in acute distress.    Appearance: She is well-developed. She is not ill-appearing.  HENT:     Head: Normocephalic and atraumatic.     Right Ear: Tympanic membrane normal.     Left Ear: Tympanic membrane normal.     Mouth/Throat:     Mouth: Mucous membranes are moist.  Eyes:     Conjunctiva/sclera: Conjunctivae normal.  Cardiovascular:     Rate and Rhythm: Normal rate and regular rhythm.     Heart sounds: Normal heart sounds. No murmur.  Pulmonary:     Effort: Pulmonary effort is normal. No respiratory distress.     Breath sounds: Normal breath sounds. No wheezing or rhonchi.  Abdominal:     General: Bowel sounds are normal.     Palpations: Abdomen is soft.     Tenderness: There is no abdominal tenderness. There is no guarding or rebound.  Musculoskeletal:     Cervical back: Neck supple.  Skin:    General: Skin is warm and dry.     Findings: No rash.  Neurological:     General: No focal deficit present.     Mental Status: She is alert and oriented to person, place, and time.     Cranial Nerves: No cranial  nerve deficit.     Sensory: No sensory deficit.     Motor: No weakness.     Coordination: Coordination normal.     Gait: Gait normal.  Psychiatric:        Mood and Affect: Mood normal.        Behavior: Behavior  normal.      UC Treatments / Results  Labs (all labs ordered are listed, but only abnormal results are displayed) Labs Reviewed  POCT FASTING CBG [facility] MANUAL ENTRY - Abnormal; Notable for the following components:      Result Value   POCT Glucose ([facility]) 118 (*)    All other components within normal limits    EKG   Radiology No results found.  Procedures Procedures (including critical care time)  Medications Ordered in UC Medications - No data to display  Initial Impression / Assessment and Plan / UC Course  I have reviewed the triage vital signs and the nursing notes.  Pertinent labs & imaging results that were available during my care of the patient were reviewed by me and considered in my medical decision making (see chart for details).   Chest pain.  EKG shows sinus rhythm, rate 66, no ST elevation, compared to previous in 2019.  Discussed limitations of urgent care visit as opposed to going to the emergency department.  Discussed with patient that her blood pressure is normal at this time.  Patient declines lab work; states she will get this done at her PCP.  Instructed her to go to the emergency department if she has chest pain, shortness of breath, weakness, numbness, confusion, slurred speech, dizziness, or other concerning symptoms.  Patient agrees to plan of care.   Final Clinical Impressions(s) / UC Diagnoses   Final diagnoses:  Chest pain, unspecified type     Discharge Instructions     Your blood pressure is 134/81; your blood sugar is 118.     Go to the emergency department if you have chest pain, shortness of breath, weakness, numbness, confusion, speech difficulty, or other concerning symptoms.        ED Prescriptions    None     PDMP  not reviewed this encounter.   Sharion Balloon, NP 04/23/19 [phone]

## 2019-04-23 NOTE — Telephone Encounter (Signed)
Call pt on her cell number [phone]

## 2019-04-23 NOTE — Discharge Instructions (Addendum)
Your blood pressure is 134/81; your blood sugar is 118.     Go to the emergency department if you have chest pain, shortness of breath, weakness, numbness, confusion, speech difficulty, or other concerning symptoms.

## 2019-05-12 ENCOUNTER — Telehealth: Payer: Self-pay | Admitting: Internal Medicine

## 2019-05-12 MED ORDER — AMLODIPINE BESYLATE 5 MG PO TABS: 5.0000 mg | ORAL_TABLET | Freq: Every day | ORAL | 1 refills | Status: DC

## 2019-05-12 NOTE — Telephone Encounter (Signed)
Pt is almost of amlodipine prescription. The pharmacy told her she doesn't have any refills. She needs this sent to [facility] on [address].

## 2019-06-17 ENCOUNTER — Other Ambulatory Visit: Payer: Self-pay

## 2019-06-17 ENCOUNTER — Other Ambulatory Visit (INDEPENDENT_AMBULATORY_CARE_PROVIDER_SITE_OTHER): Payer: No Typology Code available for payment source

## 2019-06-17 DIAGNOSIS — E7800 Pure hypercholesterolemia, unspecified: Secondary | ICD-10-CM

## 2019-06-17 DIAGNOSIS — E119 Type 2 diabetes mellitus without complications: Secondary | ICD-10-CM

## 2019-06-17 DIAGNOSIS — I10 Essential (primary) hypertension: Secondary | ICD-10-CM | POA: Diagnosis not present

## 2019-06-17 LAB — CBC WITH DIFFERENTIAL/PLATELET
Basophils Absolute: 0 10*3/uL (ref 0.0–0.1)
Basophils Relative: 0.4 % (ref 0.0–3.0)
Eosinophils Absolute: 0.1 10*3/uL (ref 0.0–0.7)
Eosinophils Relative: 1.5 % (ref 0.0–5.0)
HCT: 37.4 % (ref 36.0–46.0)
Hemoglobin: 12.5 g/dL (ref 12.0–15.0)
Lymphocytes Relative: 36.8 % (ref 12.0–46.0)
Lymphs Abs: 2.2 10*3/uL (ref 0.7–4.0)
MCHC: 33.5 g/dL (ref 30.0–36.0)
MCV: 87.6 fl (ref 78.0–100.0)
Monocytes Absolute: 0.3 10*3/uL (ref 0.1–1.0)
Monocytes Relative: 5.8 % (ref 3.0–12.0)
Neutro Abs: 3.3 10*3/uL (ref 1.4–7.7)
Neutrophils Relative %: 55.5 % (ref 43.0–77.0)
Platelets: 229 10*3/uL (ref 150.0–400.0)
RBC: 4.27 Mil/uL (ref 3.87–5.11)
RDW: 13.9 % (ref 11.5–15.5)
WBC: 6 10*3/uL (ref 4.0–10.5)

## 2019-06-17 LAB — HEPATIC FUNCTION PANEL
ALT: 8 U/L (ref 0–35)
AST: 12 U/L (ref 0–37)
Albumin: 4.5 g/dL (ref 3.5–5.2)
Alkaline Phosphatase: 79 U/L (ref 39–117)
Bilirubin, Direct: 0.1 mg/dL (ref 0.0–0.3)
Total Bilirubin: 0.4 mg/dL (ref 0.2–1.2)
Total Protein: 6.9 g/dL (ref 6.0–8.3)

## 2019-06-17 LAB — BASIC METABOLIC PANEL
BUN: 11 mg/dL (ref 6–23)
CO2: 31 mEq/L (ref 19–32)
Calcium: 9.5 mg/dL (ref 8.4–10.5)
Chloride: 103 mEq/L (ref 96–112)
Creatinine, Ser: 0.84 mg/dL (ref 0.40–1.20)
GFR: 84.57 mL/min (ref >60.00)
Glucose, Bld: 117 mg/dL — ABNORMAL HIGH (ref 70–99)
Potassium: 4.1 mEq/L (ref 3.5–5.1)
Sodium: 138 mEq/L (ref 135–145)

## 2019-06-17 LAB — LIPID PANEL
Cholesterol: 233 mg/dL — ABNORMAL HIGH (ref 0–200)
HDL: 45.6 mg/dL (ref >39.00)
LDL Cholesterol: 164 mg/dL — ABNORMAL HIGH (ref 0–99)
NonHDL: 187.73
Total CHOL/HDL Ratio: 5
Triglycerides: 119 mg/dL (ref 0.0–149.0)
VLDL: 23.8 mg/dL (ref 0.0–40.0)

## 2019-06-17 LAB — HEMOGLOBIN A1C: Hgb A1c MFr Bld: 7 % — ABNORMAL HIGH (ref 4.6–6.5)

## 2019-06-17 LAB — TSH: TSH: 1.41 u[IU]/mL (ref 0.35–4.50)

## 2019-06-20 ENCOUNTER — Ambulatory Visit (INDEPENDENT_AMBULATORY_CARE_PROVIDER_SITE_OTHER): Payer: No Typology Code available for payment source | Admitting: Internal Medicine

## 2019-06-20 ENCOUNTER — Other Ambulatory Visit: Payer: Self-pay

## 2019-06-20 DIAGNOSIS — E559 Vitamin D deficiency, unspecified: Secondary | ICD-10-CM | POA: Diagnosis not present

## 2019-06-20 DIAGNOSIS — E7800 Pure hypercholesterolemia, unspecified: Secondary | ICD-10-CM | POA: Diagnosis not present

## 2019-06-20 DIAGNOSIS — E1165 Type 2 diabetes mellitus with hyperglycemia: Secondary | ICD-10-CM

## 2019-06-20 DIAGNOSIS — I10 Essential (primary) hypertension: Secondary | ICD-10-CM

## 2019-06-20 DIAGNOSIS — F439 Reaction to severe stress, unspecified: Secondary | ICD-10-CM | POA: Diagnosis not present

## 2019-06-20 DIAGNOSIS — R519 Headache, unspecified: Secondary | ICD-10-CM

## 2019-06-20 DIAGNOSIS — D649 Anemia, unspecified: Secondary | ICD-10-CM

## 2019-06-20 DIAGNOSIS — K219 Gastro-esophageal reflux disease without esophagitis: Secondary | ICD-10-CM

## 2019-06-20 LAB — HM DIABETES FOOT EXAM

## 2019-06-20 MED ORDER — ROSUVASTATIN CALCIUM 10 MG PO TABS: 10.0000 mg | ORAL_TABLET | Freq: Every day | ORAL | 2 refills | Status: DC

## 2019-06-20 MED ORDER — METFORMIN HCL ER 500 MG PO TB24: 500.0000 mg | ORAL_TABLET | Freq: Every day | ORAL | 2 refills | Status: DC

## 2019-06-20 NOTE — Progress Notes (Signed)
Patient ID: Courtney Donovan, female   DOB: 1960/06/20, 59 y.o.   MRN: [id number]   Subjective:    Patient ID: Courtney Donovan, female    DOB: Jun 04, 1960, 59 y.o.   MRN: [id number]  HPI This visit occurred during the SARS-CoV-2 public health emergency.  Safety protocols were in place, including screening questions prior to the visit, additional usage of staff PPE, and extensive cleaning of exam room while observing appropriate contact time as indicated for disinfecting solutions.  Patient here for a scheduled follow up.  She reports she is doing relatively well.  Seeing neurology for bilateral occipital neuralgia.  Prescribed amitriptyline.  Some discomfort in her neck and shoulders.  They discussed PT.  She is having problems with low back pain and pain down leg.  Discussed xray.  Wants to hold on xray at this time.  Will check with insurance and notify me if persistent symptoms and desires f/u xray.  Is having some lower abdominal pressure.  Described as a band around.  Has had previous pelvic surgeries - hysterectomy, etc.  Discussed f/u with GYN.  She desires referral.  No chest pain.  Breathing stable.  Bowels stable.     Past Medical History:  Diagnosis Date  . Allergy   . Anemia   . Chronic headaches   . Diabetes mellitus ([place])    diet controlled  . Hypercholesterolemia   . Hypertension    Past Surgical History:  Procedure Laterality Date  . ABDOMINAL HYSTERECTOMY  2011   supracervical hysterectomy  . TONSILECTOMY/ADENOIDECTOMY WITH MYRINGOTOMY  1067  . TONSILLECTOMY    . TUBAL LIGATION  1996   Family History  Problem Relation Age of Onset  . Hypertension Mother   . Cancer Father        lung  . Hypertension Father    Social History   Socioeconomic History  . Marital status: Married    Spouse name: Not on file  . Number of children: 2  . Years of education: Not on file  . Highest education level: Not on file  Occupational History    Employer:  [employer]  Tobacco Use  . Smoking status: Never Smoker  . Smokeless tobacco: Never Used  Vaping Use  . Vaping Use: Never used  Substance and Sexual Activity  . Alcohol use: No    Alcohol/week: 0.0 standard drinks  . Drug use: No  . Sexual activity: Not on file  Other Topics Concern  . Not on file  Social History Narrative  . Not on file   Social Determinants of Health   Financial Resource Strain:   . Difficulty of Paying Living Expenses:   Food Insecurity:   . Worried About Charity fundraiser in the Last Year:   . Arboriculturist in the Last Year:   Transportation Needs:   . Film/video editor (Medical):   Marland Kitchen Lack of Transportation (Non-Medical):   Physical Activity:   . Days of Exercise per Week:   . Minutes of Exercise per Session:   Stress:   . Feeling of Stress :   Social Connections:   . Frequency of Communication with Friends and Family:   . Frequency of Social Gatherings with Friends and Family:   . Attends Religious Services:   . Active Member of Clubs or Organizations:   . Attends Archivist Meetings:   Marland Kitchen Marital Status:     Outpatient Encounter Medications as of 06/20/2019  Medication Sig  .  amitriptyline (ELAVIL) 25 MG tablet Take by mouth.  Marland Kitchen amLODipine (NORVASC) 5 MG tablet Take 1 tablet (5 mg total) by mouth daily.  Marland Kitchen aspirin 81 MG chewable tablet Chew 81 mg by mouth daily.  . baclofen (LIORESAL) 10 MG tablet Take by mouth.  . fluticasone (FLONASE) 50 MCG/ACT nasal spray Place 2 sprays into both nostrils daily.  . metFORMIN (GLUCOPHAGE XR) 500 MG 24 hr tablet Take 1 tablet (500 mg total) by mouth daily.  . Multiple Vitamin (MULTIVITAMIN) tablet Take 1 tablet by mouth daily.  Marland Kitchen omeprazole (PRILOSEC) 40 MG capsule TAKE 1 CAPSULE BY MOUTH  DAILY  . rosuvastatin (CRESTOR) 10 MG tablet Take 1 tablet (10 mg total) by mouth daily.  . sucralfate (CARAFATE) 1 g tablet TAKE 1 TABLET BY MOUTH THREE TIMES DAILY BEFORE  MEALS  . [DISCONTINUED] rosuvastatin  (CRESTOR) 10 MG tablet Take 1 tablet (10 mg total) by mouth daily.   No facility-administered encounter medications on file as of 06/20/2019.    Review of Systems  Constitutional: Negative for appetite change and unexpected weight change.  HENT: Negative for congestion and sinus pressure.   Respiratory: Negative for cough, chest tightness and shortness of breath.   Cardiovascular: Negative for chest pain, palpitations and leg swelling.  Gastrointestinal: Negative for diarrhea, nausea and vomiting.       Lower abdominal pain as outlined.   Genitourinary: Negative for difficulty urinating and dysuria.  Musculoskeletal: Negative for joint swelling and myalgias.       Low back pain as outlined.    Skin: Negative for color change and rash.  Neurological: Negative for dizziness and light-headedness.       Diagnosed with occipital neuralgia.    Psychiatric/Behavioral: Negative for agitation and dysphoric mood.       Objective:    Physical Exam Constitutional:      General: She is not in acute distress.    Appearance: Normal appearance.  HENT:     Head: Normocephalic and atraumatic.     Right Ear: External ear normal.     Left Ear: External ear normal.  Eyes:     General: No scleral icterus.       Right eye: No discharge.        Left eye: No discharge.     Conjunctiva/sclera: Conjunctivae normal.  Neck:     Thyroid: No thyromegaly.  Cardiovascular:     Rate and Rhythm: Normal rate and regular rhythm.  Pulmonary:     Effort: No respiratory distress.     Breath sounds: Normal breath sounds. No wheezing.  Abdominal:     General: Bowel sounds are normal.     Palpations: Abdomen is soft.     Tenderness: There is no abdominal tenderness.  Musculoskeletal:        General: No swelling or tenderness.     Cervical back: Neck supple. No tenderness.  Lymphadenopathy:     Cervical: No cervical adenopathy.  Skin:    Findings: No erythema or rash.  Neurological:     Mental Status: She  is alert.  Psychiatric:        Mood and Affect: Mood normal.        Behavior: Behavior normal.     BP 126/74   Pulse 77   Temp (!) 97 F (36.1 C)   Resp 16   Ht _0  (1.499 m)   Wt 178 lb 9.6 oz (81 kg)   SpO2 99%   BMI 36.07 kg/m  Wt  Readings from Last 3 Encounters:  06/20/19 178 lb 9.6 oz (81 kg)  04/23/19 176 lb (79.8 kg)  04/15/19 178 lb (80.7 kg)     Lab Results  Component Value Date   WBC 6.0 06/17/2019   HGB 12.5 06/17/2019   HCT 37.4 06/17/2019   PLT 229.0 06/17/2019   GLUCOSE 117 (H) 06/17/2019   CHOL 233 (H) 06/17/2019   TRIG 119.0 06/17/2019   HDL 45.60 06/17/2019   LDLDIRECT 159.5 01/16/2013   LDLCALC 164 (H) 06/17/2019   ALT 8 06/17/2019   AST 12 06/17/2019   NA 138 06/17/2019   K 4.1 06/17/2019   CL 103 06/17/2019   CREATININE 0.84 06/17/2019   BUN 11 06/17/2019   CO2 31 06/17/2019   TSH 1.41 06/17/2019   HGBA1C 7.0 (H) 06/17/2019   MICROALBUR <0.7 08/20/2018       Assessment & Plan:   Problem List Items Addressed This Visit    Anemia    Follow cbc.       Relevant Orders   CBC with Differential/Platelet   IBC + Ferritin   Vitamin B12   Diabetes mellitus ([place])    Low carb diet and exercise.  Discussed recent labs.  a1c 7.0.  Start metformin.  She is in agreement.  Follow met b and a1c.  Low carb diet and exercise.        Relevant Medications   rosuvastatin (CRESTOR) 10 MG tablet   metFORMIN (GLUCOPHAGE XR) 500 MG 24 hr tablet   Other Relevant Orders   Hemoglobin I6E   Basic metabolic panel   Microalbumin / creatinine urine ratio   GERD (gastroesophageal reflux disease)    No upper symptoms reported.  On prilosec.        Headache    Seeing neurology.  Diagnosed with occipital neuralgia.  Prescribed amitriptyline.        Hypercholesterolemia    Not taking crestor.  Tolerated.  Low cholesterol diet and exercise.  Agreeable to restart crestor.  Follow lipid panel and liver function tests.        Relevant Medications    rosuvastatin (CRESTOR) 10 MG tablet   Other Relevant Orders   Hepatic function panel   Lipid panel   Hypertension    Blood pressure doing well.  Continue amlodipine.  Follow pressures.  Follow metabolic panel.       Relevant Medications   rosuvastatin (CRESTOR) 10 MG tablet   Stress    Overall appears to be handling things well.  Follow.        Vitamin D deficiency    Follow vitamin D level.       Relevant Orders   VITAMIN D 25 Hydroxy (Vit-D Deficiency, Fractures)       Einar Pheasant, MD

## 2019-06-21 ENCOUNTER — Encounter: Payer: Self-pay | Admitting: Internal Medicine

## 2019-06-21 NOTE — Assessment & Plan Note (Signed)
Overall appears to be handling things well.  Follow.  ?

## 2019-06-21 NOTE — Assessment & Plan Note (Signed)
Seeing neurology.  Diagnosed with occipital neuralgia.  Prescribed amitriptyline.

## 2019-06-21 NOTE — Assessment & Plan Note (Signed)
Follow vitamin D level.  

## 2019-06-21 NOTE — Assessment & Plan Note (Signed)
Blood pressure doing well.  Continue amlodipine.  Follow pressures.  Follow metabolic panel.  

## 2019-06-21 NOTE — Addendum Note (Signed)
Addended by: Jacorey Donaway S on: 06/21/2019 04:57 PM   Modules accepted: Level of Service  

## 2019-06-21 NOTE — Assessment & Plan Note (Signed)
No upper symptoms reported. On prilosec.  

## 2019-06-21 NOTE — Assessment & Plan Note (Signed)
Low carb diet and exercise.  Discussed recent labs.  a1c 7.0.  Start metformin.  She is in agreement.  Follow met b and a1c.  Low carb diet and exercise.   

## 2019-06-21 NOTE — Assessment & Plan Note (Signed)
Not taking crestor.  Tolerated.  Low cholesterol diet and exercise.  Agreeable to restart crestor.  Follow lipid panel and liver function tests.

## 2019-06-21 NOTE — Assessment & Plan Note (Signed)
Follow cbc.  

## 2019-08-08 ENCOUNTER — Telehealth: Payer: Self-pay | Admitting: Internal Medicine

## 2019-08-08 DIAGNOSIS — E1165 Type 2 diabetes mellitus with hyperglycemia: Secondary | ICD-10-CM

## 2019-08-08 NOTE — Telephone Encounter (Signed)
Pt would like a referral to an endocrinologist. She looked up Dr. Gabriel Carina at [facility] but she would like Dr. Nicki Reaper recommendation as to where to go?

## 2019-08-11 NOTE — Telephone Encounter (Signed)
Called patient. Unable to leave message.

## 2019-08-13 NOTE — Telephone Encounter (Signed)
I have placed order for endocrinology referral to Dr Gabriel Carina.  Someone should be contacting her with an appt date and time.  Let us know if any problems.

## 2019-08-13 NOTE — Addendum Note (Signed)
Addended by: Alisa Graff on: 08/13/2019 08:20 PM   Modules accepted: Orders

## 2019-08-13 NOTE — Telephone Encounter (Signed)
Patient would like to see endocrinology for her diabetes.

## 2019-08-14 NOTE — Telephone Encounter (Signed)
Pt aware.

## 2019-09-05 ENCOUNTER — Ambulatory Visit: Payer: No Typology Code available for payment source | Admitting: Obstetrics and Gynecology

## 2019-09-05 ENCOUNTER — Encounter: Payer: Self-pay | Admitting: Obstetrics and Gynecology

## 2019-09-05 ENCOUNTER — Other Ambulatory Visit: Payer: Self-pay

## 2019-09-05 VITALS — BP 124/68 | Ht 59.0 in | Wt 171.0 lb

## 2019-09-05 DIAGNOSIS — N941 Unspecified dyspareunia: Secondary | ICD-10-CM | POA: Diagnosis not present

## 2019-09-05 NOTE — Patient Instructions (Signed)
Vaginal/ Vulvar Moisturizer Use 3-5 times a week at bedtime  Hyalo Gyn Revaree Replens [place] Key-E suppositories Vitamin E oil, olive oil, coconut oil   Water-based Lubricants  Astroglide KY Jelly  Luvena Aquagel  Silicone- based Lubricants Pjur PINK Astroglide silicone Uberlube    

## 2019-09-05 NOTE — Progress Notes (Signed)
Patient ID: Courtney Donovan, female   DOB: 1960/03/30, 59 y.o.   MRN: [id number]  Reason for Consult: Pelvic Pain (Pressure, vaginal dryness, painful intercourse)   Referred by Einar Pheasant, MD  Subjective:     HPI:  Courtney Donovan is a 59 y.o. female She is having pain with intercourse.   Past Medical History:  Diagnosis Date  . Allergy   . Anemia   . Chronic headaches   . Diabetes mellitus ([place])    diet controlled  . Hypercholesterolemia   . Hypertension    Family History  Problem Relation Age of Onset  . Hypertension Mother   . Cancer Father        lung  . Hypertension Father    Past Surgical History:  Procedure Laterality Date  . ABDOMINAL HYSTERECTOMY  2011   supracervical hysterectomy  . TONSILECTOMY/ADENOIDECTOMY WITH MYRINGOTOMY  1067  . TONSILLECTOMY    . TUBAL LIGATION  1996    Short Social History:  Social History   Tobacco Use  . Smoking status: Never Smoker  . Smokeless tobacco: Never Used  Substance Use Topics  . Alcohol use: No    Alcohol/week: 0.0 standard drinks    Allergies  Allergen Reactions  . Atorvastatin Other (See Comments)    Other reaction(s): Muscle Pain CANT TAKE CANT TAKE  . Metrogel [Metronidazole]   . Pravastatin     Other reaction(s): Muscle Pain CANT TAKE MYALGIAS  . Latex Rash    Current Outpatient Medications  Medication Sig Dispense Refill  . amLODipine (NORVASC) 5 MG tablet Take 1 tablet (5 mg total) by mouth daily. 90 tablet 1  . aspirin 81 MG chewable tablet Chew 81 mg by mouth daily.    . fluticasone (FLONASE) 50 MCG/ACT nasal spray Place 2 sprays into both nostrils daily. 48 g 3  . metFORMIN (GLUCOPHAGE XR) 500 MG 24 hr tablet Take 1 tablet (500 mg total) by mouth daily. 30 tablet 2  . Multiple Vitamin (MULTIVITAMIN) tablet Take 1 tablet by mouth daily.    Marland Kitchen omeprazole (PRILOSEC) 40 MG capsule TAKE 1 CAPSULE BY MOUTH  DAILY 90 capsule 3  . rosuvastatin (CRESTOR) 10 MG  tablet Take 1 tablet (10 mg total) by mouth daily. 30 tablet 2  . sucralfate (CARAFATE) 1 g tablet TAKE 1 TABLET BY MOUTH THREE TIMES DAILY BEFORE  MEALS 90 tablet 0  . amitriptyline (ELAVIL) 25 MG tablet Take by mouth.     No current facility-administered medications for this visit.    Review of Systems  Constitutional: Negative for chills, fatigue, fever and unexpected weight change.  HENT: Negative for trouble swallowing.  Eyes: Negative for loss of vision.  Respiratory: Negative for cough, shortness of breath and wheezing.  Cardiovascular: Negative for chest pain, leg swelling, palpitations and syncope.  GI: Negative for abdominal pain, blood in stool, diarrhea, nausea and vomiting.  GU: Negative for difficulty urinating, dysuria, frequency and hematuria.  Musculoskeletal: Negative for back pain, leg pain and joint pain.  Skin: Negative for rash.  Neurological: Negative for dizziness, headaches, light-headedness, numbness and seizures.  Psychiatric: Negative for behavioral problem, confusion, depressed mood and sleep disturbance.        Objective:  Objective   Vitals:   09/05/19 1555  BP: 124/68  Weight: 171 lb (77.6 kg)  Height: 4\' 11"  (1.499 m)   Body mass index is 34.54 kg/m.  Physical Exam Vitals and nursing note reviewed.  Constitutional:  Appearance: She is well-developed.  HENT:     Head: Normocephalic and atraumatic.  Eyes:     Pupils: Pupils are equal, round, and reactive to light.  Cardiovascular:     Rate and Rhythm: Normal rate and regular rhythm.  Pulmonary:     Effort: Pulmonary effort is normal. No respiratory distress.  Skin:    General: Skin is warm and dry.  Neurological:     Mental Status: She is alert and oriented to person, place, and time.  Psychiatric:        Behavior: Behavior normal.        Thought Content: Thought content normal.        Judgment: Judgment normal.        Assessment/Plan:     59 yo with vaginal dryness and  pain with intercourse Discussed over the counter lubricants. Can consider hormone replacement therapy  More than 15 minutes were spent face to face with the patient in the room, reviewing the medical record, labs and images, and coordinating care for the patient. The plan of management was discussed in detail and counseling was provided.    Adrian Prows MD [facility] 09/16/2019 9:16 AM

## 2019-09-12 ENCOUNTER — Other Ambulatory Visit (INDEPENDENT_AMBULATORY_CARE_PROVIDER_SITE_OTHER): Payer: No Typology Code available for payment source

## 2019-09-12 ENCOUNTER — Other Ambulatory Visit: Payer: Self-pay

## 2019-09-12 DIAGNOSIS — E1165 Type 2 diabetes mellitus with hyperglycemia: Secondary | ICD-10-CM

## 2019-09-12 DIAGNOSIS — D649 Anemia, unspecified: Secondary | ICD-10-CM | POA: Diagnosis not present

## 2019-09-12 DIAGNOSIS — E7800 Pure hypercholesterolemia, unspecified: Secondary | ICD-10-CM | POA: Diagnosis not present

## 2019-09-12 DIAGNOSIS — E559 Vitamin D deficiency, unspecified: Secondary | ICD-10-CM

## 2019-09-12 LAB — BASIC METABOLIC PANEL
BUN: 17 mg/dL (ref 6–23)
CO2: 26 mEq/L (ref 19–32)
Calcium: 9.5 mg/dL (ref 8.4–10.5)
Chloride: 105 mEq/L (ref 96–112)
Creatinine, Ser: 0.88 mg/dL (ref 0.40–1.20)
GFR: 80.08 mL/min (ref >60.00)
Glucose, Bld: 109 mg/dL — ABNORMAL HIGH (ref 70–99)
Potassium: 4.2 mEq/L (ref 3.5–5.1)
Sodium: 139 mEq/L (ref 135–145)

## 2019-09-12 LAB — CBC WITH DIFFERENTIAL/PLATELET
Basophils Absolute: 0 10*3/uL (ref 0.0–0.1)
Basophils Relative: 0.3 % (ref 0.0–3.0)
Eosinophils Absolute: 0.1 10*3/uL (ref 0.0–0.7)
Eosinophils Relative: 1 % (ref 0.0–5.0)
HCT: 37.5 % (ref 36.0–46.0)
Hemoglobin: 12.3 g/dL (ref 12.0–15.0)
Lymphocytes Relative: 32.9 % (ref 12.0–46.0)
Lymphs Abs: 2 10*3/uL (ref 0.7–4.0)
MCHC: 32.9 g/dL (ref 30.0–36.0)
MCV: 87.5 fl (ref 78.0–100.0)
Monocytes Absolute: 0.3 10*3/uL (ref 0.1–1.0)
Monocytes Relative: 5.1 % (ref 3.0–12.0)
Neutro Abs: 3.6 10*3/uL (ref 1.4–7.7)
Neutrophils Relative %: 60.7 % (ref 43.0–77.0)
Platelets: 221 10*3/uL (ref 150.0–400.0)
RBC: 4.29 Mil/uL (ref 3.87–5.11)
RDW: 13.9 % (ref 11.5–15.5)
WBC: 6 10*3/uL (ref 4.0–10.5)

## 2019-09-12 LAB — MICROALBUMIN / CREATININE URINE RATIO
Creatinine,U: 161 mg/dL
Microalb Creat Ratio: 0.5 mg/g (ref 0.0–30.0)
Microalb, Ur: 0.8 mg/dL (ref 0.0–1.9)

## 2019-09-12 LAB — LIPID PANEL
Cholesterol: 146 mg/dL (ref 0–200)
HDL: 46 mg/dL (ref >39.00)
LDL Cholesterol: 88 mg/dL (ref 0–99)
NonHDL: 100.44
Total CHOL/HDL Ratio: 3
Triglycerides: 62 mg/dL (ref 0.0–149.0)
VLDL: 12.4 mg/dL (ref 0.0–40.0)

## 2019-09-12 LAB — VITAMIN D 25 HYDROXY (VIT D DEFICIENCY, FRACTURES): VITD: 19.76 ng/mL — ABNORMAL LOW (ref 30.00–100.00)

## 2019-09-12 LAB — HEPATIC FUNCTION PANEL
ALT: 8 U/L (ref 0–35)
AST: 11 U/L (ref 0–37)
Albumin: 4.4 g/dL (ref 3.5–5.2)
Alkaline Phosphatase: 81 U/L (ref 39–117)
Bilirubin, Direct: 0.1 mg/dL (ref 0.0–0.3)
Total Bilirubin: 0.3 mg/dL (ref 0.2–1.2)
Total Protein: 6.7 g/dL (ref 6.0–8.3)

## 2019-09-12 LAB — HEMOGLOBIN A1C: Hgb A1c MFr Bld: 6.6 % — ABNORMAL HIGH (ref 4.6–6.5)

## 2019-09-12 LAB — IBC + FERRITIN
Ferritin: 119.9 ng/mL (ref 10.0–291.0)
Iron: 33 ug/dL — ABNORMAL LOW (ref 42–145)
Saturation Ratios: 12 % — ABNORMAL LOW (ref 20.0–50.0)
Transferrin: 197 mg/dL — ABNORMAL LOW (ref 212.0–360.0)

## 2019-09-12 LAB — VITAMIN B12: Vitamin B-12: 451 pg/mL (ref 211–911)

## 2019-09-14 ENCOUNTER — Encounter: Payer: Self-pay | Admitting: Internal Medicine

## 2019-09-19 ENCOUNTER — Other Ambulatory Visit: Payer: Self-pay | Admitting: Internal Medicine

## 2019-09-19 DIAGNOSIS — E611 Iron deficiency: Secondary | ICD-10-CM

## 2019-09-19 NOTE — Progress Notes (Signed)
Order placed for GI referral.   

## 2019-09-23 ENCOUNTER — Other Ambulatory Visit: Payer: No Typology Code available for payment source

## 2019-09-25 ENCOUNTER — Encounter: Payer: Self-pay | Admitting: Internal Medicine

## 2019-09-25 ENCOUNTER — Other Ambulatory Visit: Payer: Self-pay

## 2019-09-25 ENCOUNTER — Ambulatory Visit (INDEPENDENT_AMBULATORY_CARE_PROVIDER_SITE_OTHER): Payer: No Typology Code available for payment source | Admitting: Internal Medicine

## 2019-09-25 DIAGNOSIS — E611 Iron deficiency: Secondary | ICD-10-CM

## 2019-09-25 DIAGNOSIS — E7800 Pure hypercholesterolemia, unspecified: Secondary | ICD-10-CM | POA: Diagnosis not present

## 2019-09-25 DIAGNOSIS — R519 Headache, unspecified: Secondary | ICD-10-CM

## 2019-09-25 DIAGNOSIS — K219 Gastro-esophageal reflux disease without esophagitis: Secondary | ICD-10-CM

## 2019-09-25 DIAGNOSIS — E559 Vitamin D deficiency, unspecified: Secondary | ICD-10-CM

## 2019-09-25 DIAGNOSIS — R21 Rash and other nonspecific skin eruption: Secondary | ICD-10-CM | POA: Diagnosis not present

## 2019-09-25 DIAGNOSIS — I10 Essential (primary) hypertension: Secondary | ICD-10-CM | POA: Diagnosis not present

## 2019-09-25 DIAGNOSIS — E1165 Type 2 diabetes mellitus with hyperglycemia: Secondary | ICD-10-CM

## 2019-09-25 MED ORDER — NYSTATIN 100000 UNIT/GM EX CREA: 1.0000 "application " | TOPICAL_CREAM | Freq: Two times a day (BID) | CUTANEOUS | 0 refills | Status: DC

## 2019-09-25 NOTE — Progress Notes (Signed)
Patient ID: Courtney Donovan, female   DOB: Aug 16, 1960, 59 y.o.   MRN: [id number]   Subjective:    Patient ID: Courtney Donovan, female    DOB: [birth year], 59 y.o.   MRN: [id number]  HPI This visit occurred during the SARS-CoV-2 public health emergency.  Safety protocols were in place, including screening questions prior to the visit, additional usage of staff PPE, and extensive cleaning of exam room while observing appropriate contact time as indicated for disinfecting solutions.  Patient here for a scheduled follow up. She reports she is doing well.  Here to f/u regarding her cholesterol and sugar.  She has adjusted her diet.  Doing well with this.  Feels better.  a1c improved.  Staying active.  No chest pain or sob.  No acid reflux.  No abdominal pain or cramping.  Bowels moving.  Has f/u with GI 11/2019.  Handling stress.     Past Medical History:  Diagnosis Date  . Allergy   . Anemia   . Chronic headaches   . Diabetes mellitus ([place])    diet controlled  . Hypercholesterolemia   . Hypertension    Past Surgical History:  Procedure Laterality Date  . ABDOMINAL HYSTERECTOMY  2011   supracervical hysterectomy  . TONSILECTOMY/ADENOIDECTOMY WITH MYRINGOTOMY  1067  . TONSILLECTOMY    . TUBAL LIGATION  1996   Family History  Problem Relation Age of Onset  . Hypertension Mother   . Cancer Father        lung  . Hypertension Father    Social History   Socioeconomic History  . Marital status: Married    Spouse name: Not on file  . Number of children: 2  . Years of education: Not on file  . Highest education level: Not on file  Occupational History    Employer: [employer]  Tobacco Use  . Smoking status: Never Smoker  . Smokeless tobacco: Never Used  Vaping Use  . Vaping Use: Never used  Substance and Sexual Activity  . Alcohol use: No    Alcohol/week: 0.0 standard drinks  . Drug use: No  . Sexual activity: Yes  Other Topics Concern  . Not on file    Social History Narrative  . Not on file   Social Determinants of Health   Financial Resource Strain:   . Difficulty of Paying Living Expenses: Not on file  Food Insecurity:   . Worried About Charity fundraiser in the Last Year: Not on file  . Ran Out of Food in the Last Year: Not on file  Transportation Needs:   . Lack of Transportation (Medical): Not on file  . Lack of Transportation (Non-Medical): Not on file  Physical Activity:   . Days of Exercise per Week: Not on file  . Minutes of Exercise per Session: Not on file  Stress:   . Feeling of Stress : Not on file  Social Connections:   . Frequency of Communication with Friends and Family: Not on file  . Frequency of Social Gatherings with Friends and Family: Not on file  . Attends Religious Services: Not on file  . Active Member of Clubs or Organizations: Not on file  . Attends Archivist Meetings: Not on file  . Marital Status: Not on file    Outpatient Encounter Medications as of 09/25/2019  Medication Sig  . amLODipine (NORVASC) 5 MG tablet Take 1 tablet (5 mg total) by mouth daily.  Marland Kitchen aspirin 81 MG chewable  tablet Chew 81 mg by mouth daily.  . fluticasone (FLONASE) 50 MCG/ACT nasal spray Place 2 sprays into both nostrils daily.  . metFORMIN (GLUCOPHAGE XR) 500 MG 24 hr tablet Take 1 tablet (500 mg total) by mouth daily.  . Multiple Vitamin (MULTIVITAMIN) tablet Take 1 tablet by mouth daily.  Marland Kitchen omeprazole (PRILOSEC) 40 MG capsule TAKE 1 CAPSULE BY MOUTH  DAILY  . rosuvastatin (CRESTOR) 10 MG tablet Take 1 tablet (10 mg total) by mouth daily.  . sucralfate (CARAFATE) 1 g tablet TAKE 1 TABLET BY MOUTH THREE TIMES DAILY BEFORE  MEALS  . amitriptyline (ELAVIL) 25 MG tablet Take by mouth.  . nystatin cream (MYCOSTATIN) Apply 1 application topically 2 (two) times daily.   No facility-administered encounter medications on file as of 09/25/2019.   Review of Systems  Constitutional: Negative for appetite change and  unexpected weight change.  HENT: Negative for congestion and sinus pressure.   Respiratory: Negative for cough, chest tightness and shortness of breath.   Cardiovascular: Negative for chest pain, palpitations and leg swelling.  Gastrointestinal: Negative for abdominal pain, diarrhea, nausea and vomiting.  Genitourinary: Negative for difficulty urinating and dysuria.  Musculoskeletal: Negative for joint swelling and myalgias.  Skin: Negative for color change.       Rash on neck.   Neurological: Negative for dizziness, light-headedness and headaches.  Psychiatric/Behavioral: Negative for agitation and dysphoric mood.       Objective:    Physical Exam Vitals reviewed.  Constitutional:      Appearance: Normal appearance.  HENT:     Nose: Nose normal.  Eyes:     General: No scleral icterus.       Right eye: No discharge.        Left eye: No discharge.     Conjunctiva/sclera: Conjunctivae normal.  Neck:     Thyroid: No thyromegaly.  Cardiovascular:     Rate and Rhythm: Normal rate and regular rhythm.  Pulmonary:     Effort: No respiratory distress.     Breath sounds: Normal breath sounds. No wheezing.  Abdominal:     General: Bowel sounds are normal.     Palpations: Abdomen is soft.     Tenderness: There is no abdominal tenderness.  Musculoskeletal:        General: No swelling or tenderness.     Cervical back: Neck supple. No tenderness.  Lymphadenopathy:     Cervical: No cervical adenopathy.  Skin:    Findings: No erythema or rash.  Neurological:     Mental Status: She is alert.  Psychiatric:        Mood and Affect: Mood normal.        Behavior: Behavior normal.     BP 108/64 (BP Location: Left Arm, Patient Position: Sitting)   Pulse 72   Temp 98.6 F (37 C)   Ht 4' 11.02" (1.499 m)   Wt 172 lb (78 kg)   SpO2 99%   BMI 34.72 kg/m  Wt Readings from Last 3 Encounters:  09/25/19 172 lb (78 kg)  09/05/19 171 lb (77.6 kg)  06/20/19 178 lb 9.6 oz (81 kg)      Lab Results  Component Value Date   WBC 6.0 09/12/2019   HGB 12.3 09/12/2019   HCT 37.5 09/12/2019   PLT 221.0 09/12/2019   GLUCOSE 109 (H) 09/12/2019   CHOL 146 09/12/2019   TRIG 62.0 09/12/2019   HDL 46.00 09/12/2019   LDLDIRECT 159.5 01/16/2013   LDLCALC 88 09/12/2019  ALT 8 09/12/2019   AST 11 09/12/2019   NA 139 09/12/2019   K 4.2 09/12/2019   CL 105 09/12/2019   CREATININE 0.88 09/12/2019   BUN 17 09/12/2019   CO2 26 09/12/2019   TSH 1.41 06/17/2019   HGBA1C 6.6 (H) 09/12/2019   MICROALBUR 0.8 09/12/2019       Assessment & Plan:   Problem List Items Addressed This Visit    Vitamin D deficiency    Vitamin d level low on recent lab check.  Instructed to take Vitamin D3 as directed.  Follow.       Rash of neck    Trial - nystatin.       Iron deficiency    Has been referred to GI for w/up - iron deficiency.        Hypertension    Blood pressure doing well.  Continue amlodipine.  Follow pressures.  Follow metabolic panel.       Hypercholesterolemia    On crestor.  Low cholesterol diet and exercise.  Follow lipid panel and liver function tests.   Lab Results  Component Value Date   CHOL 146 09/12/2019   HDL 46.00 09/12/2019   LDLCALC 88 09/12/2019   LDLDIRECT 159.5 01/16/2013   TRIG 62.0 09/12/2019   CHOLHDL 3 09/12/2019        Relevant Orders   Hepatic function panel   Lipid panel   Headache    Seeing neurology.  Diagnosed with occipital neuralgia.  On amitriptyline.  Doing well.  Follow.        GERD (gastroesophageal reflux disease)    On prilosec.  No upper symptoms.       Diabetes mellitus ([place])    Has adjusted her diet.  Sugars improved.  a1c 6.6.  Low carb diet and exercise.  Follow met b and a1c.  On metformin.        Relevant Orders   Hemoglobin R8X   Basic metabolic panel       Einar Pheasant, MD

## 2019-10-04 ENCOUNTER — Encounter: Payer: Self-pay | Admitting: Internal Medicine

## 2019-10-04 NOTE — Assessment & Plan Note (Signed)
Has adjusted her diet.  Sugars improved.  a1c 6.6.  Low carb diet and exercise.  Follow met b and a1c.  On metformin.

## 2019-10-04 NOTE — Assessment & Plan Note (Signed)
Blood pressure doing well.  Continue amlodipine.  Follow pressures.  Follow metabolic panel.  

## 2019-10-04 NOTE — Assessment & Plan Note (Signed)
Has been referred to GI for w/up - iron deficiency.

## 2019-10-04 NOTE — Assessment & Plan Note (Signed)
Trial - nystatin.

## 2019-10-04 NOTE — Assessment & Plan Note (Signed)
On crestor.  Low cholesterol diet and exercise.  Follow lipid panel and liver function tests.   Lab Results  Component Value Date   CHOL 146 09/12/2019   HDL 46.00 09/12/2019   LDLCALC 88 09/12/2019   LDLDIRECT 159.5 01/16/2013   TRIG 62.0 09/12/2019   CHOLHDL 3 09/12/2019

## 2019-10-04 NOTE — Assessment & Plan Note (Signed)
On prilosec.  No upper symptoms.

## 2019-10-04 NOTE — Assessment & Plan Note (Signed)
Vitamin d level low on recent lab check.  Instructed to take Vitamin D3 as directed.  Follow.

## 2019-10-04 NOTE — Assessment & Plan Note (Signed)
Seeing neurology.  Diagnosed with occipital neuralgia.  On amitriptyline.  Doing well.  Follow.

## 2019-10-15 ENCOUNTER — Encounter: Payer: Self-pay | Admitting: Internal Medicine

## 2019-10-16 ENCOUNTER — Other Ambulatory Visit: Payer: Self-pay

## 2019-10-16 MED ORDER — AMLODIPINE BESYLATE 5 MG PO TABS: 5.0000 mg | ORAL_TABLET | Freq: Every day | ORAL | 1 refills | Status: DC

## 2019-10-16 MED ORDER — ROSUVASTATIN CALCIUM 10 MG PO TABS
10.0000 mg | ORAL_TABLET | Freq: Every day | ORAL | 1 refills | Status: DC
Start: 2019-10-16 — End: 2020-02-23

## 2019-11-17 ENCOUNTER — Other Ambulatory Visit: Payer: Self-pay | Admitting: Internal Medicine

## 2019-11-17 DIAGNOSIS — Z1231 Encounter for screening mammogram for malignant neoplasm of breast: Secondary | ICD-10-CM

## 2019-12-02 ENCOUNTER — Other Ambulatory Visit: Payer: Self-pay | Admitting: Internal Medicine

## 2019-12-17 ENCOUNTER — Other Ambulatory Visit: Payer: Self-pay | Admitting: Internal Medicine

## 2019-12-22 ENCOUNTER — Ambulatory Visit
Admission: RE | Admit: 2019-12-22 | Discharge: 2019-12-22 | Disposition: A | Discharge status: Home / Self Care | Payer: No Typology Code available for payment source | Source: Ambulatory Visit | Attending: Internal Medicine | Admitting: Internal Medicine

## 2019-12-22 ENCOUNTER — Other Ambulatory Visit: Payer: Self-pay

## 2019-12-22 DIAGNOSIS — Z1231 Encounter for screening mammogram for malignant neoplasm of breast: Secondary | ICD-10-CM

## 2020-01-19 ENCOUNTER — Telehealth: Payer: Self-pay | Admitting: Internal Medicine

## 2020-01-19 DIAGNOSIS — R0981 Nasal congestion: Secondary | ICD-10-CM

## 2020-01-19 NOTE — Telephone Encounter (Signed)
Patient has High BP and would like to know what to take for a stuffy nose, sinus pressure, drainage.

## 2020-01-19 NOTE — Telephone Encounter (Signed)
These symptoms can be c/w covid.  Probably needs to be tested.  Can schedule virtual visit.  Saline nasal spray flush nose bid prn.  Nasacort nasal spray - 2 sprays each nostril one time per day.  Do this in the evening.  Robitussin DM bid prn.

## 2020-01-19 NOTE — Telephone Encounter (Signed)
Patient aware and scheduled for VV with Dr Nicki Reaper. Coming in tomorrow for COVID test.

## 2020-01-20 ENCOUNTER — Other Ambulatory Visit: Payer: No Typology Code available for payment source

## 2020-01-20 DIAGNOSIS — R0981 Nasal congestion: Secondary | ICD-10-CM

## 2020-01-21 ENCOUNTER — Encounter: Payer: Self-pay | Admitting: Internal Medicine

## 2020-01-21 ENCOUNTER — Telehealth (INDEPENDENT_AMBULATORY_CARE_PROVIDER_SITE_OTHER): Payer: No Typology Code available for payment source | Admitting: Internal Medicine

## 2020-01-21 DIAGNOSIS — I10 Essential (primary) hypertension: Secondary | ICD-10-CM | POA: Diagnosis not present

## 2020-01-21 DIAGNOSIS — J329 Chronic sinusitis, unspecified: Secondary | ICD-10-CM

## 2020-01-21 MED ORDER — ALIGN 4 MG PO CAPS: ORAL_CAPSULE | ORAL | 0 refills | Status: DC

## 2020-01-21 MED ORDER — AMOXICILLIN 875 MG PO TABS: 875.0000 mg | ORAL_TABLET | Freq: Two times a day (BID) | ORAL | 0 refills | Status: DC

## 2020-01-21 NOTE — Progress Notes (Signed)
Patient ID: Courtney Donovan, female   DOB: Dec 28, 1960, 60 y.o.   MRN: [id number]   Virtual Visit via video Note  This visit type was conducted due to national recommendations for restrictions regarding the COVID-19 pandemic (e.g. social distancing).  This format is felt to be most appropriate for this patient at this time.  All issues noted in this document were discussed and addressed.  No physical exam was performed (except for noted visual exam findings with Video Visits).   I connected with Courtney Donovan by a video enabled telemedicine application and verified that I am speaking with the correct person using two identifiers. Location patient: home Location provider: work Persons participating in the virtual visit: patient, provider  The limitations, risks, security and privacy concerns of performing an evaluation and management service by video and the availability of in person appointments have been discussed.  It has also been discussed with the patient that there may be a patient responsible charge related to this service. The patient expressed understanding and agreed to proceed.   Reason for visit:  Work in appt  HPI: Work in appt with concerns regarding possible sinus infection.  Symptoms started one week ago.  Reports increased drainage and sinus pressure.  Over the next several days, she developed cough and some congestion - chest.  Taking mucinex DM.  Head and nose - stuffy.  Ears stopped up.  Pulse ox 99%.  Pulse 76.  No nausea, vomiting or diarrhea.  No earache.  No chest pain or sob.  Denies any known covid exposure.     ROS: See pertinent positives and negatives per HPI.  Past Medical History:  Diagnosis Date  . Allergy   . Anemia   . Chronic headaches   . Diabetes mellitus ([place])    diet controlled  . Hypercholesterolemia   . Hypertension     Past Surgical History:  Procedure Laterality Date  . ABDOMINAL HYSTERECTOMY  2011   supracervical  hysterectomy  . TONSILECTOMY/ADENOIDECTOMY WITH MYRINGOTOMY  1067  . TONSILLECTOMY    . TUBAL LIGATION  1996    Family History  Problem Relation Age of Onset  . Hypertension Mother   . Cancer Father        lung  . Hypertension Father   . Breast cancer Neg Hx     SOCIAL HX: reviewed.    Current Outpatient Medications:  .  amLODipine (NORVASC) 5 MG tablet, Take 1 tablet (5 mg total) by mouth daily., Disp: 90 tablet, Rfl: 1 .  amoxicillin (AMOXIL) 875 MG tablet, Take 1 tablet (875 mg total) by mouth 2 (two) times daily., Disp: 20 tablet, Rfl: 0 .  aspirin 81 MG chewable tablet, Chew 81 mg by mouth daily., Disp: , Rfl:  .  Continuous Blood Gluc Transmit (DEXCOM G6 TRANSMITTER) MISC, Apply 1 Device topically every 14 (fourteen) days., Disp: , Rfl:  .  fluticasone (FLONASE) 50 MCG/ACT nasal spray, Place 2 sprays into both nostrils daily., Disp: 48 g, Rfl: 3 .  metFORMIN (GLUCOPHAGE-XR) 500 MG 24 hr tablet, Take 1 tablet by mouth once daily, Disp: 90 tablet, Rfl: 0 .  Multiple Vitamin (MULTIVITAMIN) tablet, Take 1 tablet by mouth daily., Disp: , Rfl:  .  nystatin cream (MYCOSTATIN), Apply 1 application topically 2 (two) times daily., Disp: 30 g, Rfl: 0 .  omeprazole (PRILOSEC) 40 MG capsule, TAKE 1 CAPSULE BY MOUTH  DAILY, Disp: 90 capsule, Rfl: 3 .  Probiotic Product (ALIGN) 4 MG CAPS, Take one  tablet q day, Disp: 30 capsule, Rfl: 0 .  rosuvastatin (CRESTOR) 10 MG tablet, Take 1 tablet (10 mg total) by mouth daily. (Patient taking differently: Take 10 mg by mouth every other day.), Disp: 90 tablet, Rfl: 1 .  sucralfate (CARAFATE) 1 g tablet, TAKE 1 TABLET BY MOUTH THREE TIMES DAILY BEFORE MEAL(S), Disp: 90 tablet, Rfl: 0 .  Rimegepant Sulfate 75 MG TBDP, Take 1 tablet by mouth as needed. (Patient not taking: Reported on 01/21/2020), Disp: , Rfl:  .  rizatriptan (MAXALT) 10 MG tablet, Take 1 tablet by mouth as needed. (Patient not taking: Reported on 01/21/2020), Disp: , Rfl:    EXAM:  VITALS per patient if applicable:  38%, pulse 76  GENERAL: alert, oriented, appears well and in no acute distress  HEENT: atraumatic, conjunttiva clear, no obvious abnormalities on inspection of external nose and ears  NECK: normal movements of the head and neck  LUNGS: on inspection no signs of respiratory distress, breathing rate appears normal, no obvious gross SOB, gasping or wheezing  CV: no obvious cyanosis  PSYCH/NEURO: pleasant and cooperative, no obvious depression or anxiety, speech and thought processing grossly intact  ASSESSMENT AND PLAN:  Discussed the following assessment and plan:  Problem List Items Addressed This Visit    Hypertension    Blood pressure has been doing well.  Continue amlodipine.  Follow pressures.       Sinusitis    Symptoms appear to be c/w sinus infection/URI.  Treat with saline nasal spray and flonase nasal spray as directed.  Can continue mucinex DM.  Amoxicillin 875mg  bid as directed.  Probiotic as directed.  Discussed possible covid.  Discussed quarantine.  Will schedule covid swab.  Call with update of symptoms.        Relevant Medications   amoxicillin (AMOXIL) 875 MG tablet       I discussed the assessment and treatment plan with the patient. The patient was provided an opportunity to ask questions and all were answered. The patient agreed with the plan and demonstrated an understanding of the instructions.   The patient was advised to call back or seek an in-person evaluation if the symptoms worsen or if the condition fails to improve as anticipated.    Einar Pheasant, MD

## 2020-01-22 LAB — SARS-COV-2, NAA 2 DAY TAT

## 2020-01-22 LAB — NOVEL CORONAVIRUS, NAA: SARS-CoV-2, NAA: NOT DETECTED

## 2020-01-25 ENCOUNTER — Encounter: Payer: Self-pay | Admitting: Internal Medicine

## 2020-01-25 DIAGNOSIS — J329 Chronic sinusitis, unspecified: Secondary | ICD-10-CM | POA: Insufficient documentation

## 2020-01-25 NOTE — Assessment & Plan Note (Signed)
Blood pressure has been doing well.  Continue amlodipine.  Follow pressures.   

## 2020-01-25 NOTE — Assessment & Plan Note (Signed)
Symptoms appear to be c/w sinus infection/URI.  Treat with saline nasal spray and flonase nasal spray as directed.  Can continue mucinex DM.  Amoxicillin 875mg  bid as directed.  Probiotic as directed.  Discussed possible covid.  Discussed quarantine.  Will schedule covid swab.  Call with update of symptoms.

## 2020-01-27 ENCOUNTER — Other Ambulatory Visit: Payer: No Typology Code available for payment source

## 2020-01-29 ENCOUNTER — Encounter: Payer: No Typology Code available for payment source | Admitting: Internal Medicine

## 2020-02-10 ENCOUNTER — Other Ambulatory Visit (INDEPENDENT_AMBULATORY_CARE_PROVIDER_SITE_OTHER): Payer: No Typology Code available for payment source

## 2020-02-10 ENCOUNTER — Other Ambulatory Visit: Payer: Self-pay

## 2020-02-10 DIAGNOSIS — E7800 Pure hypercholesterolemia, unspecified: Secondary | ICD-10-CM | POA: Diagnosis not present

## 2020-02-10 DIAGNOSIS — E1165 Type 2 diabetes mellitus with hyperglycemia: Secondary | ICD-10-CM

## 2020-02-10 LAB — HEPATIC FUNCTION PANEL
ALT: 8 U/L (ref 0–35)
AST: 11 U/L (ref 0–37)
Albumin: 4.3 g/dL (ref 3.5–5.2)
Alkaline Phosphatase: 70 U/L (ref 39–117)
Bilirubin, Direct: 0.1 mg/dL (ref 0.0–0.3)
Total Bilirubin: 0.4 mg/dL (ref 0.2–1.2)
Total Protein: 7 g/dL (ref 6.0–8.3)

## 2020-02-10 LAB — HEMOGLOBIN A1C: Hgb A1c MFr Bld: 6.3 % (ref 4.6–6.5)

## 2020-02-10 LAB — LIPID PANEL
Cholesterol: 146 mg/dL (ref 0–200)
HDL: 46.1 mg/dL (ref >39.00)
LDL Cholesterol: 88 mg/dL (ref 0–99)
NonHDL: 99.68
Total CHOL/HDL Ratio: 3
Triglycerides: 56 mg/dL (ref 0.0–149.0)
VLDL: 11.2 mg/dL (ref 0.0–40.0)

## 2020-02-10 LAB — BASIC METABOLIC PANEL
BUN: 15 mg/dL (ref 6–23)
CO2: 30 mEq/L (ref 19–32)
Calcium: 9.7 mg/dL (ref 8.4–10.5)
Chloride: 104 mEq/L (ref 96–112)
Creatinine, Ser: 0.91 mg/dL (ref 0.40–1.20)
GFR: 69.98 mL/min (ref >60.00)
Glucose, Bld: 95 mg/dL (ref 70–99)
Potassium: 4.2 mEq/L (ref 3.5–5.1)
Sodium: 139 mEq/L (ref 135–145)

## 2020-02-12 ENCOUNTER — Other Ambulatory Visit: Payer: Self-pay

## 2020-02-12 ENCOUNTER — Other Ambulatory Visit (HOSPITAL_COMMUNITY)
Admission: RE | Admit: 2020-02-12 | Discharge: 2020-02-12 | Disposition: A | Discharge status: Home / Self Care | Payer: No Typology Code available for payment source | Source: Ambulatory Visit | Attending: Internal Medicine | Admitting: Internal Medicine

## 2020-02-12 ENCOUNTER — Ambulatory Visit (INDEPENDENT_AMBULATORY_CARE_PROVIDER_SITE_OTHER): Payer: No Typology Code available for payment source | Admitting: Internal Medicine

## 2020-02-12 VITALS — BP 116/70 | HR 74 | Temp 98.2°F | Resp 16 | Ht 59.0 in | Wt 159.0 lb

## 2020-02-12 DIAGNOSIS — Z124 Encounter for screening for malignant neoplasm of cervix: Secondary | ICD-10-CM | POA: Insufficient documentation

## 2020-02-12 DIAGNOSIS — E559 Vitamin D deficiency, unspecified: Secondary | ICD-10-CM | POA: Diagnosis not present

## 2020-02-12 DIAGNOSIS — E611 Iron deficiency: Secondary | ICD-10-CM

## 2020-02-12 DIAGNOSIS — J329 Chronic sinusitis, unspecified: Secondary | ICD-10-CM

## 2020-02-12 DIAGNOSIS — F439 Reaction to severe stress, unspecified: Secondary | ICD-10-CM

## 2020-02-12 DIAGNOSIS — E7800 Pure hypercholesterolemia, unspecified: Secondary | ICD-10-CM

## 2020-02-12 DIAGNOSIS — K219 Gastro-esophageal reflux disease without esophagitis: Secondary | ICD-10-CM

## 2020-02-12 DIAGNOSIS — I10 Essential (primary) hypertension: Secondary | ICD-10-CM

## 2020-02-12 DIAGNOSIS — Z0000 Encounter for general adult medical examination without abnormal findings: Secondary | ICD-10-CM | POA: Diagnosis not present

## 2020-02-12 DIAGNOSIS — E1165 Type 2 diabetes mellitus with hyperglycemia: Secondary | ICD-10-CM

## 2020-02-12 NOTE — Progress Notes (Signed)
Patient ID: Han Lysne, female   DOB: [birth year], 60 y.o.   MRN: [id number]   Subjective:    Patient ID: [name], female    DOB: 10/09/1960, 60 y.o.   MRN: [id number]  HPI This visit occurred during the SARS-CoV-2 public health emergency.  Safety protocols were in place, including screening questions prior to the visit, additional usage of staff PPE, and extensive cleaning of exam room while observing appropriate contact time as indicated for disinfecting solutions.  Patient here for physical exam.  She reports she is doing relatively well.  Had a root canal this am.  Was seen recently for sinus infection.  covid test negative.  No significant congestion now.  Nearly resolved.  No chest pain or sob reported.  No abdominal pain.  Acid reflux controlled.  Saw neurology for migraine.  Has rizatriptan if needed. Also diagnosed with bilateral occipital neuralgia.  Started on tizanidine.  Doing better overall. No significant headache reported.  Saw Gi - f/u IDA.  Given hemoccult cards - negative. Left arm has been aching - night.  Not a significant issue throughout the day. Discussed positioning and stretches.     Past Medical History:  Diagnosis Date  . Allergy   . Anemia   . Chronic headaches   . Diabetes mellitus ([place])    diet controlled  . Hypercholesterolemia   . Hypertension    Past Surgical History:  Procedure Laterality Date  . ABDOMINAL HYSTERECTOMY  2011   supracervical hysterectomy  . TONSILECTOMY/ADENOIDECTOMY WITH MYRINGOTOMY  1067  . TONSILLECTOMY    . TUBAL LIGATION  1996   Family History  Problem Relation Age of Onset  . Hypertension Mother   . Cancer Father        lung  . Hypertension Father   . Breast cancer Neg Hx    Social History   Socioeconomic History  . Marital status: Married    Spouse name: Not on file  . Number of children: 2  . Years of education: Not on file  . Highest education level: Not on file  Occupational  History    Employer: [employer]  Tobacco Use  . Smoking status: Never Smoker  . Smokeless tobacco: Never Used  Vaping Use  . Vaping Use: Never used  Substance and Sexual Activity  . Alcohol use: No    Alcohol/week: 0.0 standard drinks  . Drug use: No  . Sexual activity: Yes  Other Topics Concern  . Not on file  Social History Narrative  . Not on file   Social Determinants of Health   Financial Resource Strain: Not on file  Food Insecurity: Not on file  Transportation Needs: Not on file  Physical Activity: Not on file  Stress: Not on file  Social Connections: Not on file    Outpatient Encounter Medications as of 02/12/2020  Medication Sig  . amLODipine (NORVASC) 5 MG tablet Take 1 tablet (5 mg total) by mouth daily.  Marland Kitchen amoxicillin (AMOXIL) 875 MG tablet Take 1 tablet (875 mg total) by mouth 2 (two) times daily.  Marland Kitchen aspirin 81 MG chewable tablet Chew 81 mg by mouth daily.  . Continuous Blood Gluc Transmit (DEXCOM G6 TRANSMITTER) MISC Apply 1 Device topically every 14 (fourteen) days.  . fluticasone (FLONASE) 50 MCG/ACT nasal spray Place 2 sprays into both nostrils daily.  . metFORMIN (GLUCOPHAGE-XR) 500 MG 24 hr tablet Take 1 tablet by mouth once daily  . Multiple Vitamin (MULTIVITAMIN) tablet Take 1 tablet by  mouth daily.  Marland Kitchen nystatin cream (MYCOSTATIN) Apply 1 application topically 2 (two) times daily.  Marland Kitchen omeprazole (PRILOSEC) 40 MG capsule TAKE 1 CAPSULE BY MOUTH  DAILY  . Probiotic Product (ALIGN) 4 MG CAPS Take one tablet q day  . Rimegepant Sulfate 75 MG TBDP Take 1 tablet by mouth as needed. (Patient not taking: Reported on 01/21/2020)  . rizatriptan (MAXALT) 10 MG tablet Take 1 tablet by mouth as needed. (Patient not taking: Reported on 01/21/2020)  . rosuvastatin (CRESTOR) 10 MG tablet Take 1 tablet (10 mg total) by mouth daily. (Patient taking differently: Take 10 mg by mouth every other day.)  . sucralfate (CARAFATE) 1 g tablet TAKE 1 TABLET BY MOUTH THREE TIMES DAILY BEFORE  MEAL(S)   No facility-administered encounter medications on file as of 02/12/2020.    Review of Systems  Constitutional: Negative for appetite change and unexpected weight change.  HENT: Negative for congestion, sinus pressure and sore throat.   Eyes: Negative for pain and visual disturbance.  Respiratory: Negative for cough, chest tightness and shortness of breath.   Cardiovascular: Negative for chest pain, palpitations and leg swelling.  Gastrointestinal: Negative for abdominal pain, diarrhea, nausea and vomiting.  Genitourinary: Negative for difficulty urinating and dysuria.  Musculoskeletal: Negative for joint swelling and myalgias.       Left ar aching as outlined.   Skin: Negative for color change and rash.  Neurological: Negative for dizziness, light-headedness and headaches.  Hematological: Negative for adenopathy. Does not bruise/bleed easily.  Psychiatric/Behavioral: Negative for agitation and dysphoric mood.       Objective:    Physical Exam Vitals reviewed.  Constitutional:      General: She is not in acute distress.    Appearance: Normal appearance. She is well-developed and well-nourished.  HENT:     Head: Normocephalic and atraumatic.     Right Ear: External ear normal.     Left Ear: External ear normal.     Mouth/Throat:     Mouth: Oropharynx is clear and moist.  Eyes:     General: No scleral icterus.       Right eye: No discharge.        Left eye: No discharge.     Conjunctiva/sclera: Conjunctivae normal.  Neck:     Thyroid: No thyromegaly.  Cardiovascular:     Rate and Rhythm: Normal rate and regular rhythm.  Pulmonary:     Effort: No tachypnea, accessory muscle usage or respiratory distress.     Breath sounds: Normal breath sounds. No decreased breath sounds or wheezing.  Chest:  Breasts:     Right: No inverted nipple, mass, nipple discharge or tenderness (no axillary adenopathy).     Left: No inverted nipple, mass, nipple discharge or tenderness (no  axilarry adenopathy).    Abdominal:     General: Bowel sounds are normal.     Palpations: Abdomen is soft.     Tenderness: There is no abdominal tenderness.  Musculoskeletal:        General: No swelling, tenderness or edema.     Cervical back: Neck supple. No tenderness.  Lymphadenopathy:     Cervical: No cervical adenopathy.  Skin:    Findings: No erythema or rash.  Neurological:     Mental Status: She is alert and oriented to person, place, and time.  Psychiatric:        Mood and Affect: Mood and affect and mood normal.        Behavior: Behavior normal.  BP 116/70   Pulse 74   Temp 98.2 F (36.8 C) (Oral)   Resp 16   Ht _0  (1.499 m)   Wt 159 lb (72.1 kg)   SpO2 99%   BMI 32.11 kg/m  Wt Readings from Last 3 Encounters:  02/12/20 159 lb (72.1 kg)  01/21/20 157 lb (71.2 kg)  09/25/19 172 lb (78 kg)     Lab Results  Component Value Date   WBC 6.0 09/12/2019   HGB 12.3 09/12/2019   HCT 37.5 09/12/2019   PLT 221.0 09/12/2019   GLUCOSE 95 02/10/2020   CHOL 146 02/10/2020   TRIG 56.0 02/10/2020   HDL 46.10 02/10/2020   LDLDIRECT 159.5 01/16/2013   LDLCALC 88 02/10/2020   ALT 8 02/10/2020   AST 11 02/10/2020   NA 139 02/10/2020   K 4.2 02/10/2020   CL 104 02/10/2020   CREATININE 0.91 02/10/2020   BUN 15 02/10/2020   CO2 30 02/10/2020   TSH 1.41 06/17/2019   HGBA1C 6.3 02/10/2020   MICROALBUR 0.8 09/12/2019    MM 3D SCREEN BREAST BILATERAL  Result Date: 12/24/2019 CLINICAL DATA:  Screening. EXAM: DIGITAL SCREENING BILATERAL MAMMOGRAM WITH TOMO AND CAD COMPARISON:  Previous exam(s). ACR Breast Density Category b: There are scattered areas of fibroglandular density. FINDINGS: There are no findings suspicious for malignancy. Images were processed with CAD. IMPRESSION: No mammographic evidence of malignancy. A result letter of this screening mammogram will be mailed directly to the patient. RECOMMENDATION: Screening mammogram in one year. (Code:SM-B-01Y)  BI-RADS CATEGORY  1: Negative. Electronically Signed   By: Kristopher Oppenheim M.D.   On: 12/24/2019 10:22       Assessment & Plan:   Problem List Items Addressed This Visit    Diabetes mellitus ([place])    Low carb diet and exercise.  Maintaining her weight loss.  Follow met b and a1c.       Relevant Orders   Hemoglobin A1c   GERD (gastroesophageal reflux disease)    Acid reflux controlled.  On prilosec.        Health care maintenance    Physical today 02/12/20.  PAP 02/12/20.  Colonoscopy 11/2012.  Recommended f/u in 10 years.  Hemoccult cards negative.       Hypercholesterolemia    On crestor.  Low cholesterol idet and exercise.  Follow lipid panel and liver function tests.        Relevant Orders   Lipid panel   Hepatic function panel   Hypertension    Continue amlodipine.  Blood pressure doing well.  Follow pressures.  Follow metabolic panel.       Relevant Orders   Basic metabolic panel   Iron deficiency    Saw GI.  Hemoccult card negative. Follow cbc and iron studies.        Relevant Orders   CBC with Differential/Platelet   Ferritin   Sinusitis    Recently treated.  Symptoms improved.  Follow.       Stress    Overall appears to be handling things well.  Follow.       Vitamin D deficiency    Continue vitamin  D supplements.         Other Visit Diagnoses    Cervical cancer screening    -  Primary   Relevant Orders   Cytology - PAP( [place])       Einar Pheasant, MD

## 2020-02-14 ENCOUNTER — Encounter: Payer: Self-pay | Admitting: Internal Medicine

## 2020-02-14 NOTE — Assessment & Plan Note (Signed)
Recently treated.  Symptoms improved.  Follow.

## 2020-02-14 NOTE — Assessment & Plan Note (Signed)
Saw GI.  Hemoccult card negative. Follow cbc and iron studies.

## 2020-02-14 NOTE — Assessment & Plan Note (Signed)
Acid reflux controlled.  On prilosec.

## 2020-02-14 NOTE — Assessment & Plan Note (Signed)
Overall appears to be handling things well.  Follow.  ?

## 2020-02-14 NOTE — Assessment & Plan Note (Signed)
On crestor.  Low cholesterol idet and exercise.  Follow lipid panel and liver function tests.

## 2020-02-14 NOTE — Assessment & Plan Note (Signed)
Continue vitamin D supplements.  

## 2020-02-14 NOTE — Assessment & Plan Note (Signed)
Physical today 02/12/20.  PAP 02/12/20.  Colonoscopy 11/2012.  Recommended f/u in 10 years.  Hemoccult cards negative.

## 2020-02-14 NOTE — Assessment & Plan Note (Signed)
Continue amlodipine.  Blood pressure doing well.  Follow pressures.  Follow metabolic panel.  

## 2020-02-14 NOTE — Assessment & Plan Note (Signed)
Low carb diet and exercise.  Maintaining her weight loss.  Follow met b and a1c.

## 2020-02-18 LAB — CYTOLOGY - PAP
Comment: NEGATIVE
Diagnosis: NEGATIVE
High risk HPV: NEGATIVE

## 2020-02-21 ENCOUNTER — Other Ambulatory Visit: Payer: Self-pay | Admitting: Internal Medicine

## 2020-03-11 ENCOUNTER — Telehealth: Payer: Self-pay | Admitting: Internal Medicine

## 2020-03-11 NOTE — Telephone Encounter (Signed)
Patient called in wanted to know if Dr.Scott would call her something for a pinched nerve she stated tha tshe took some medication that she had for pain but it did not work

## 2020-03-11 NOTE — Telephone Encounter (Signed)
Appointment has been scheduled,

## 2020-03-11 NOTE — Telephone Encounter (Signed)
Please triage. I have a couple appts blocked tomorrow if you need to use one.

## 2020-03-12 ENCOUNTER — Ambulatory Visit (INDEPENDENT_AMBULATORY_CARE_PROVIDER_SITE_OTHER): Payer: No Typology Code available for payment source | Admitting: Internal Medicine

## 2020-03-12 ENCOUNTER — Other Ambulatory Visit: Payer: Self-pay

## 2020-03-12 ENCOUNTER — Encounter: Payer: Self-pay | Admitting: Internal Medicine

## 2020-03-12 DIAGNOSIS — I10 Essential (primary) hypertension: Secondary | ICD-10-CM | POA: Diagnosis not present

## 2020-03-12 DIAGNOSIS — M542 Cervicalgia: Secondary | ICD-10-CM

## 2020-03-12 DIAGNOSIS — E1165 Type 2 diabetes mellitus with hyperglycemia: Secondary | ICD-10-CM

## 2020-03-12 MED ORDER — METHYLPREDNISOLONE 4 MG PO TBPK: ORAL_TABLET | ORAL | 0 refills | Status: DC

## 2020-03-12 NOTE — Progress Notes (Signed)
Patient ID: Courtney Donovan, female   DOB: 1960/06/18, 60 y.o.   MRN: [id number]   Subjective:    Patient ID: Courtney Donovan, female    DOB: 31-May-1960, 60 y.o.   MRN: [id number]  HPI This visit occurred during the SARS-CoV-2 public health emergency.  Safety protocols were in place, including screening questions prior to the visit, additional usage of staff PPE, and extensive cleaning of exam room while observing appropriate contact time as indicated for disinfecting solutions.  Patient here for work in appt.  Work in for concerns regarding neck pain.  Noticed a twinge - couple of days ago.  When turns head - increased pain.  Woke yesterday am - increased spasms.  Worsened last night.  Using heating pad.  Notices pain - left base of skull down into shoulder.  No headache. No numbness or tingling down arms.  No injury or trauma.  She did walk around shopping with her head tilted - holding her phone between her head and shoulder.  Hurts to look to right and left.  Decreased pain if not turning head.  Took tizanidine. Did not help.    Past Medical History:  Diagnosis Date  . Allergy   . Anemia   . Chronic headaches   . Diabetes mellitus ([place])    diet controlled  . Hypercholesterolemia   . Hypertension    Past Surgical History:  Procedure Laterality Date  . ABDOMINAL HYSTERECTOMY  2011   supracervical hysterectomy  . TONSILECTOMY/ADENOIDECTOMY WITH MYRINGOTOMY  1067  . TONSILLECTOMY    . TUBAL LIGATION  1996   Family History  Problem Relation Age of Onset  . Hypertension Mother   . Cancer Father        lung  . Hypertension Father   . Breast cancer Neg Hx    Social History   Socioeconomic History  . Marital status: Married    Spouse name: Not on file  . Number of children: 2  . Years of education: Not on file  . Highest education level: Not on file  Occupational History    Employer: [employer]  Tobacco Use  . Smoking status: Never Smoker  . Smokeless  tobacco: Never Used  Vaping Use  . Vaping Use: Never used  Substance and Sexual Activity  . Alcohol use: No    Alcohol/week: 0.0 standard drinks  . Drug use: No  . Sexual activity: Yes  Other Topics Concern  . Not on file  Social History Narrative  . Not on file   Social Determinants of Health   Financial Resource Strain: Not on file  Food Insecurity: Not on file  Transportation Needs: Not on file  Physical Activity: Not on file  Stress: Not on file  Social Connections: Not on file    Outpatient Encounter Medications as of 03/12/2020  Medication Sig  . methylPREDNISolone (MEDROL DOSEPAK) 4 MG TBPK tablet Medrol dosepack 6 day taper. Take as directed.  Marland Kitchen amLODipine (NORVASC) 5 MG tablet TAKE 1 TABLET BY MOUTH  DAILY  . amoxicillin (AMOXIL) 875 MG tablet Take 1 tablet (875 mg total) by mouth 2 (two) times daily.  Marland Kitchen aspirin 81 MG chewable tablet Chew 81 mg by mouth daily.  . Continuous Blood Gluc Transmit (DEXCOM G6 TRANSMITTER) MISC Apply 1 Device topically every 14 (fourteen) days.  . fluticasone (FLONASE) 50 MCG/ACT nasal spray Place 2 sprays into both nostrils daily.  . metFORMIN (GLUCOPHAGE-XR) 500 MG 24 hr tablet Take 1 tablet by mouth once  daily  . Multiple Vitamin (MULTIVITAMIN) tablet Take 1 tablet by mouth daily.  Marland Kitchen nystatin cream (MYCOSTATIN) Apply 1 application topically 2 (two) times daily.  Marland Kitchen omeprazole (PRILOSEC) 40 MG capsule TAKE 1 CAPSULE BY MOUTH  DAILY  . Probiotic Product (ALIGN) 4 MG CAPS Take one tablet q day  . Rimegepant Sulfate 75 MG TBDP Take 1 tablet by mouth as needed. (Patient not taking: Reported on 01/21/2020)  . rizatriptan (MAXALT) 10 MG tablet Take 1 tablet by mouth as needed. (Patient not taking: Reported on 01/21/2020)  . rosuvastatin (CRESTOR) 10 MG tablet TAKE 1 TABLET BY MOUTH  DAILY  . sucralfate (CARAFATE) 1 g tablet TAKE 1 TABLET BY MOUTH THREE TIMES DAILY BEFORE MEAL(S)   No facility-administered encounter medications on file as of  03/12/2020.    Review of Systems  Constitutional: Negative for appetite change and unexpected weight change.  HENT: Negative for congestion.   Respiratory: Negative for cough, chest tightness and shortness of breath.   Cardiovascular: Negative for chest pain, palpitations and leg swelling.  Gastrointestinal: Negative for abdominal pain, diarrhea, nausea and vomiting.  Genitourinary: Negative for difficulty urinating and dysuria.  Musculoskeletal: Positive for neck pain. Negative for joint swelling and myalgias.  Skin: Negative for color change and rash.  Neurological: Negative for dizziness, light-headedness and headaches.  Psychiatric/Behavioral: Negative for agitation and dysphoric mood.       Objective:    Physical Exam Vitals reviewed.  Constitutional:      General: She is not in acute distress.    Appearance: Normal appearance.  HENT:     Head: Normocephalic and atraumatic.     Right Ear: External ear normal.     Left Ear: External ear normal.     Mouth/Throat:     Mouth: Oropharynx is clear and moist.  Eyes:     General: No scleral icterus.       Right eye: No discharge.        Left eye: No discharge.     Conjunctiva/sclera: Conjunctivae normal.  Neck:     Thyroid: No thyromegaly.  Cardiovascular:     Rate and Rhythm: Normal rate and regular rhythm.  Pulmonary:     Effort: No respiratory distress.     Breath sounds: Normal breath sounds. No wheezing.  Abdominal:     General: Bowel sounds are normal.     Palpations: Abdomen is soft.     Tenderness: There is no abdominal tenderness.  Musculoskeletal:        General: No swelling or edema.     Cervical back: Neck supple. No tenderness.     Comments: Increased discomfort - turning head from right to left.  Increased pain with palpation - suprascapular region.    Lymphadenopathy:     Cervical: No cervical adenopathy.  Skin:    Findings: No erythema or rash.  Neurological:     Mental Status: She is alert.   Psychiatric:        Mood and Affect: Mood normal.        Behavior: Behavior normal.     BP 112/70   Pulse 74   Temp 98.3 F (36.8 C) (Oral)   Resp 16   Ht 4\' 11"  (1.499 m)   Wt 159 lb 12.8 oz (72.5 kg)   SpO2 99%   BMI 32.28 kg/m  Wt Readings from Last 3 Encounters:  03/12/20 159 lb 12.8 oz (72.5 kg)  02/12/20 159 lb (72.1 kg)  01/21/20 157 lb (71.2 kg)  Lab Results  Component Value Date   WBC 6.0 09/12/2019   HGB 12.3 09/12/2019   HCT 37.5 09/12/2019   PLT 221.0 09/12/2019   GLUCOSE 95 02/10/2020   CHOL 146 02/10/2020   TRIG 56.0 02/10/2020   HDL 46.10 02/10/2020   LDLDIRECT 159.5 01/16/2013   LDLCALC 88 02/10/2020   ALT 8 02/10/2020   AST 11 02/10/2020   NA 139 02/10/2020   K 4.2 02/10/2020   CL 104 02/10/2020   CREATININE 0.91 02/10/2020   BUN 15 02/10/2020   CO2 30 02/10/2020   TSH 1.41 06/17/2019   HGBA1C 6.3 02/10/2020   MICROALBUR 0.8 09/12/2019       Assessment & Plan:   Problem List Items Addressed This Visit    Diabetes mellitus ([place])    Low carb diet and exercise.  Discussed steroids will elevate blood sugars.  Stay hdrated.  Follow.        Hypertension    Blood pressure doing well.  Continue amlodipine.  Follow pressures.  Follow metabolic panel.        Neck pain    Neck pain with movement as outlined.  Has tizanidine.  Can take before bed.  Medrol dose pack as directed.  Discussed may cause elevation in blood sugar.  Discussed possible side effects.  Call with update.           Einar Pheasant, MD

## 2020-03-14 ENCOUNTER — Encounter: Payer: Self-pay | Admitting: Internal Medicine

## 2020-03-14 DIAGNOSIS — M542 Cervicalgia: Secondary | ICD-10-CM | POA: Insufficient documentation

## 2020-03-14 NOTE — Assessment & Plan Note (Signed)
Neck pain with movement as outlined.  Has tizanidine.  Can take before bed.  Medrol dose pack as directed.  Discussed may cause elevation in blood sugar.  Discussed possible side effects.  Call with update.

## 2020-03-14 NOTE — Assessment & Plan Note (Signed)
Blood pressure doing well.  Continue amlodipine.  Follow pressures.  Follow metabolic panel.  

## 2020-03-14 NOTE — Assessment & Plan Note (Signed)
Low carb diet and exercise.  Discussed steroids will elevate blood sugars.  Stay hdrated.  Follow.

## 2020-04-02 ENCOUNTER — Telehealth: Payer: Self-pay | Admitting: Internal Medicine

## 2020-04-02 MED ORDER — METFORMIN HCL ER 500 MG PO TB24: 500.0000 mg | ORAL_TABLET | Freq: Every day | ORAL | 0 refills | Status: DC

## 2020-04-02 NOTE — Telephone Encounter (Signed)
Pt called she needs a new rx on the metFORMIN (GLUCOPHAGE-XR) 500 MG 24 hr tablet to now go to [facility]

## 2020-04-04 ENCOUNTER — Other Ambulatory Visit: Payer: Self-pay | Admitting: Internal Medicine

## 2020-06-08 ENCOUNTER — Other Ambulatory Visit: Payer: Self-pay | Admitting: Internal Medicine

## 2020-06-09 ENCOUNTER — Other Ambulatory Visit (INDEPENDENT_AMBULATORY_CARE_PROVIDER_SITE_OTHER): Payer: No Typology Code available for payment source

## 2020-06-09 ENCOUNTER — Other Ambulatory Visit: Payer: Self-pay

## 2020-06-09 DIAGNOSIS — E611 Iron deficiency: Secondary | ICD-10-CM | POA: Diagnosis not present

## 2020-06-09 DIAGNOSIS — I10 Essential (primary) hypertension: Secondary | ICD-10-CM

## 2020-06-09 DIAGNOSIS — E1165 Type 2 diabetes mellitus with hyperglycemia: Secondary | ICD-10-CM

## 2020-06-09 DIAGNOSIS — E7800 Pure hypercholesterolemia, unspecified: Secondary | ICD-10-CM

## 2020-06-09 LAB — HEPATIC FUNCTION PANEL
ALT: 11 U/L (ref 0–35)
AST: 13 U/L (ref 0–37)
Albumin: 4.5 g/dL (ref 3.5–5.2)
Alkaline Phosphatase: 82 U/L (ref 39–117)
Bilirubin, Direct: 0.1 mg/dL (ref 0.0–0.3)
Total Bilirubin: 0.4 mg/dL (ref 0.2–1.2)
Total Protein: 6.9 g/dL (ref 6.0–8.3)

## 2020-06-09 LAB — BASIC METABOLIC PANEL
BUN: 17 mg/dL (ref 6–23)
CO2: 30 mEq/L (ref 19–32)
Calcium: 9.7 mg/dL (ref 8.4–10.5)
Chloride: 103 mEq/L (ref 96–112)
Creatinine, Ser: 0.9 mg/dL (ref 0.40–1.20)
GFR: 70.75 mL/min (ref >60.00)
Glucose, Bld: 104 mg/dL — ABNORMAL HIGH (ref 70–99)
Potassium: 4.3 mEq/L (ref 3.5–5.1)
Sodium: 140 mEq/L (ref 135–145)

## 2020-06-09 LAB — LIPID PANEL
Cholesterol: 168 mg/dL (ref 0–200)
HDL: 55.9 mg/dL (ref >39.00)
LDL Cholesterol: 96 mg/dL (ref 0–99)
NonHDL: 112.57
Total CHOL/HDL Ratio: 3
Triglycerides: 84 mg/dL (ref 0.0–149.0)
VLDL: 16.8 mg/dL (ref 0.0–40.0)

## 2020-06-09 LAB — CBC WITH DIFFERENTIAL/PLATELET
Basophils Absolute: 0 10*3/uL (ref 0.0–0.1)
Basophils Relative: 0.3 % (ref 0.0–3.0)
Eosinophils Absolute: 0.1 10*3/uL (ref 0.0–0.7)
Eosinophils Relative: 1.5 % (ref 0.0–5.0)
HCT: 39 % (ref 36.0–46.0)
Hemoglobin: 12.9 g/dL (ref 12.0–15.0)
Lymphocytes Relative: 36 % (ref 12.0–46.0)
Lymphs Abs: 2.1 10*3/uL (ref 0.7–4.0)
MCHC: 33.2 g/dL (ref 30.0–36.0)
MCV: 87.4 fl (ref 78.0–100.0)
Monocytes Absolute: 0.4 10*3/uL (ref 0.1–1.0)
Monocytes Relative: 6.1 % (ref 3.0–12.0)
Neutro Abs: 3.3 10*3/uL (ref 1.4–7.7)
Neutrophils Relative %: 56.1 % (ref 43.0–77.0)
Platelets: 229 10*3/uL (ref 150.0–400.0)
RBC: 4.46 Mil/uL (ref 3.87–5.11)
RDW: 14 % (ref 11.5–15.5)
WBC: 5.9 10*3/uL (ref 4.0–10.5)

## 2020-06-09 LAB — HEMOGLOBIN A1C: Hgb A1c MFr Bld: 6.4 % (ref 4.6–6.5)

## 2020-06-09 LAB — FERRITIN: Ferritin: 102.8 ng/mL (ref 10.0–291.0)

## 2020-06-11 ENCOUNTER — Encounter: Payer: Self-pay | Admitting: Internal Medicine

## 2020-06-11 ENCOUNTER — Ambulatory Visit (INDEPENDENT_AMBULATORY_CARE_PROVIDER_SITE_OTHER): Payer: No Typology Code available for payment source | Admitting: Internal Medicine

## 2020-06-11 ENCOUNTER — Other Ambulatory Visit: Payer: Self-pay

## 2020-06-11 DIAGNOSIS — R21 Rash and other nonspecific skin eruption: Secondary | ICD-10-CM | POA: Diagnosis not present

## 2020-06-11 DIAGNOSIS — K219 Gastro-esophageal reflux disease without esophagitis: Secondary | ICD-10-CM | POA: Diagnosis not present

## 2020-06-11 DIAGNOSIS — E559 Vitamin D deficiency, unspecified: Secondary | ICD-10-CM

## 2020-06-11 DIAGNOSIS — E1165 Type 2 diabetes mellitus with hyperglycemia: Secondary | ICD-10-CM | POA: Diagnosis not present

## 2020-06-11 DIAGNOSIS — F439 Reaction to severe stress, unspecified: Secondary | ICD-10-CM

## 2020-06-11 DIAGNOSIS — E611 Iron deficiency: Secondary | ICD-10-CM

## 2020-06-11 DIAGNOSIS — I10 Essential (primary) hypertension: Secondary | ICD-10-CM

## 2020-06-11 DIAGNOSIS — R1030 Lower abdominal pain, unspecified: Secondary | ICD-10-CM

## 2020-06-11 DIAGNOSIS — E7800 Pure hypercholesterolemia, unspecified: Secondary | ICD-10-CM

## 2020-06-11 NOTE — Progress Notes (Signed)
Patient ID: Glendola Friedhoff, female   DOB: Mar 19, 1960, 60 y.o.   MRN: [id number]   Subjective:    Patient ID: [name], female    DOB: 09/16/1960, 60 y.o.   MRN: [id number]  HPI This visit occurred during the SARS-CoV-2 public health emergency.  Safety protocols were in place, including screening questions prior to the visit, additional usage of staff PPE, and extensive cleaning of exam room while observing appropriate contact time as indicated for disinfecting solutions.  Patient here for a scheduled follow up.  Here to follow up regarding her blood sugar, blood pressure and cholesterol.  Seeing neurology for f/u migraine headaches.  Had tizanidine and nurtec. Recommended PT of back.  Reports back stiff in am. Better when up walking.  Does report some lower abdominal pain/pressure.   States bowels are sporadic.  Discussed miralax (taking regular) to help keep bowels regular.  Overall stable.  Blood pressure doing well. Working.  Taking a real estate class.  Appears to be handling stress.  Does report persistent issues with her neck  - rash.  Used nystatin. Not clearing.  Discussed referral to dermatology.   Past Medical History:  Diagnosis Date  . Allergy   . Anemia   . Chronic headaches   . Diabetes mellitus ([place])    diet controlled  . Hypercholesterolemia   . Hypertension    Past Surgical History:  Procedure Laterality Date  . ABDOMINAL HYSTERECTOMY  2011   supracervical hysterectomy  . TONSILECTOMY/ADENOIDECTOMY WITH MYRINGOTOMY  1067  . TONSILLECTOMY    . TUBAL LIGATION  1996   Family History  Problem Relation Age of Onset  . Hypertension Mother   . Cancer Father        lung  . Hypertension Father   . Breast cancer Neg Hx    Social History   Socioeconomic History  . Marital status: Married    Spouse name: Not on file  . Number of children: 2  . Years of education: Not on file  . Highest education level: Not on file  Occupational  History    Employer: [employer]  Tobacco Use  . Smoking status: Never Smoker  . Smokeless tobacco: Never Used  Vaping Use  . Vaping Use: Never used  Substance and Sexual Activity  . Alcohol use: No    Alcohol/week: 0.0 standard drinks  . Drug use: No  . Sexual activity: Yes  Other Topics Concern  . Not on file  Social History Narrative  . Not on file   Social Determinants of Health   Financial Resource Strain: Not on file  Food Insecurity: Not on file  Transportation Needs: Not on file  Physical Activity: Not on file  Stress: Not on file  Social Connections: Not on file    Outpatient Encounter Medications as of 06/11/2020  Medication Sig  . amLODipine (NORVASC) 5 MG tablet TAKE 1 TABLET BY MOUTH  DAILY  . aspirin 81 MG chewable tablet Chew 81 mg by mouth daily.  . Continuous Blood Gluc Transmit (DEXCOM G6 TRANSMITTER) MISC Apply 1 Device topically every 14 (fourteen) days.  . fluticasone (FLONASE) 50 MCG/ACT nasal spray Place 2 sprays into both nostrils daily.  . metFORMIN (GLUCOPHAGE-XR) 500 MG 24 hr tablet TAKE 1 TABLET BY MOUTH  DAILY  . Multiple Vitamin (MULTIVITAMIN) tablet Take 1 tablet by mouth daily.  Marland Kitchen nystatin cream (MYCOSTATIN) Apply 1 application topically 2 (two) times daily.  Marland Kitchen omeprazole (PRILOSEC) 40 MG capsule TAKE 1 CAPSULE  BY MOUTH  DAILY  . Probiotic Product (ALIGN) 4 MG CAPS Take one tablet q day  . Rimegepant Sulfate 75 MG TBDP Take 1 tablet by mouth as needed.  . rizatriptan (MAXALT) 10 MG tablet Take 1 tablet by mouth as needed.  . rosuvastatin (CRESTOR) 10 MG tablet TAKE 1 TABLET BY MOUTH  DAILY  . sucralfate (CARAFATE) 1 g tablet TAKE 1 TABLET BY MOUTH THREE TIMES DAILY BEFORE MEAL(S)  . [DISCONTINUED] amoxicillin (AMOXIL) 875 MG tablet Take 1 tablet (875 mg total) by mouth 2 (two) times daily.  . [DISCONTINUED] methylPREDNISolone (MEDROL DOSEPAK) 4 MG TBPK tablet Medrol dosepack 6 day taper. Take as directed.   No facility-administered encounter  medications on file as of 06/11/2020.    Review of Systems  Constitutional: Negative for appetite change and unexpected weight change.  HENT: Negative for congestion and sinus pressure.   Respiratory: Negative for cough, chest tightness and shortness of breath.   Cardiovascular: Negative for chest pain, palpitations and leg swelling.  Gastrointestinal: Negative for diarrhea, nausea and vomiting.       Lower abdominal pain/pressure as outlined.   Genitourinary: Negative for difficulty urinating and dysuria.  Musculoskeletal: Negative for joint swelling and myalgias.  Skin: Negative for color change and rash.  Neurological: Negative for dizziness and light-headedness.       Followed by neurology for headaches - stable.   Psychiatric/Behavioral: Negative for agitation and dysphoric mood.       Objective:    Physical Exam Vitals reviewed.  Constitutional:      General: She is not in acute distress.    Appearance: Normal appearance.  HENT:     Head: Normocephalic and atraumatic.     Right Ear: External ear normal.     Left Ear: External ear normal.  Eyes:     General: No scleral icterus.       Right eye: No discharge.        Left eye: No discharge.     Conjunctiva/sclera: Conjunctivae normal.  Neck:     Thyroid: No thyromegaly.  Cardiovascular:     Rate and Rhythm: Normal rate and regular rhythm.  Pulmonary:     Effort: No respiratory distress.     Breath sounds: Normal breath sounds. No wheezing.  Abdominal:     General: Bowel sounds are normal.     Palpations: Abdomen is soft.     Tenderness: There is no abdominal tenderness.  Musculoskeletal:        General: No swelling or tenderness.     Cervical back: Neck supple. No tenderness.  Lymphadenopathy:     Cervical: No cervical adenopathy.  Skin:    Findings: No erythema or rash.  Neurological:     Mental Status: She is alert.  Psychiatric:        Mood and Affect: Mood normal.        Behavior: Behavior normal.      BP 118/70   Pulse 64   Temp 98.1 F (36.7 C)   Ht 4' 11.02" (1.499 m)   Wt 162 lb (73.5 kg)   SpO2 99%   BMI 32.70 kg/m  Wt Readings from Last 3 Encounters:  06/11/20 162 lb (73.5 kg)  03/12/20 159 lb 12.8 oz (72.5 kg)  02/12/20 159 lb (72.1 kg)     Lab Results  Component Value Date   WBC 5.9 06/09/2020   HGB 12.9 06/09/2020   HCT 39.0 06/09/2020   PLT 229.0 06/09/2020   GLUCOSE 104 (  H) 06/09/2020   CHOL 168 06/09/2020   TRIG 84.0 06/09/2020   HDL 55.90 06/09/2020   LDLDIRECT 159.5 01/16/2013   LDLCALC 96 06/09/2020   ALT 11 06/09/2020   AST 13 06/09/2020   NA 140 06/09/2020   K 4.3 06/09/2020   CL 103 06/09/2020   CREATININE 0.90 06/09/2020   BUN 17 06/09/2020   CO2 30 06/09/2020   TSH 1.41 06/17/2019   HGBA1C 6.4 06/09/2020   MICROALBUR 0.8 09/12/2019       Assessment & Plan:   Problem List Items Addressed This Visit    Abdominal pain    Lower abdominal pain/pressure.  Check pelvic ultrasound.  Further w/up pending results.        Diabetes mellitus ([place])    Low carb diet and exercise.  Discussed.  She is watching her diet.  Follow met b and a1c.       Relevant Orders   Hemoglobin E0C   Basic metabolic panel   Microalbumin / creatinine urine ratio   GERD (gastroesophageal reflux disease)    No upper symptoms reported.  On prilosec.       Hypercholesterolemia    Continue crestor.  Low cholesterol diet and exercise.  Follow lipid panel and liver function tests.   Lab Results  Component Value Date   CHOL 168 06/09/2020   HDL 55.90 06/09/2020   LDLCALC 96 06/09/2020   LDLDIRECT 159.5 01/16/2013   TRIG 84.0 06/09/2020   CHOLHDL 3 06/09/2020        Relevant Orders   Hepatic function panel   Lipid panel   Hypertension    Continue amlodipine.  Blood pressure doing well.  No changes in medication.  Follow pressures.  Follow metabolic panel.       Relevant Orders   TSH   Iron deficiency    Saw GI.  Hemoccult cards negative.  Follow  cbc and iron studies.       Rash of neck    Tried nystatin.  Persistent.  Dermatology referral as discussed.       Relevant Orders   Ambulatory referral to Dermatology   Stress    Overall appears to be handling things well.  Follow.       Vitamin D deficiency    Continue vitamin D supplements.           Einar Pheasant, MD

## 2020-06-13 ENCOUNTER — Encounter: Payer: Self-pay | Admitting: Internal Medicine

## 2020-06-13 DIAGNOSIS — R109 Unspecified abdominal pain: Secondary | ICD-10-CM | POA: Insufficient documentation

## 2020-06-13 NOTE — Assessment & Plan Note (Signed)
Continue vitamin D supplements.  

## 2020-06-13 NOTE — Assessment & Plan Note (Signed)
Continue crestor.  Low cholesterol diet and exercise.  Follow lipid panel and liver function tests.   Lab Results  Component Value Date   CHOL 168 06/09/2020   HDL 55.90 06/09/2020   LDLCALC 96 06/09/2020   LDLDIRECT 159.5 01/16/2013   TRIG 84.0 06/09/2020   CHOLHDL 3 06/09/2020

## 2020-06-13 NOTE — Assessment & Plan Note (Signed)
No upper symptoms reported. On prilosec.  

## 2020-06-13 NOTE — Assessment & Plan Note (Signed)
Tried nystatin.  Persistent.  Dermatology referral as discussed.

## 2020-06-13 NOTE — Assessment & Plan Note (Signed)
Lower abdominal pain/pressure.  Check pelvic ultrasound.  Further w/up pending results.

## 2020-06-13 NOTE — Assessment & Plan Note (Signed)
Saw GI.  Hemoccult cards negative.  Follow cbc and iron studies.  

## 2020-06-13 NOTE — Assessment & Plan Note (Signed)
Low carb diet and exercise.  Discussed.  She is watching her diet.  Follow met b and a1c.

## 2020-06-13 NOTE — Assessment & Plan Note (Signed)
Continue amlodipine.  Blood pressure doing well.  No changes in medication.  Follow pressures.  Follow metabolic panel.  

## 2020-06-13 NOTE — Assessment & Plan Note (Signed)
Overall appears to be handling things well.  Follow.  ?

## 2020-06-21 ENCOUNTER — Ambulatory Visit (INDEPENDENT_AMBULATORY_CARE_PROVIDER_SITE_OTHER): Payer: No Typology Code available for payment source | Admitting: Dermatology

## 2020-06-21 ENCOUNTER — Other Ambulatory Visit: Payer: Self-pay

## 2020-06-21 DIAGNOSIS — L308 Other specified dermatitis: Secondary | ICD-10-CM | POA: Diagnosis not present

## 2020-06-21 DIAGNOSIS — D2372 Other benign neoplasm of skin of left lower limb, including hip: Secondary | ICD-10-CM | POA: Diagnosis not present

## 2020-06-21 DIAGNOSIS — L818 Other specified disorders of pigmentation: Secondary | ICD-10-CM | POA: Diagnosis not present

## 2020-06-21 DIAGNOSIS — D239 Other benign neoplasm of skin, unspecified: Secondary | ICD-10-CM

## 2020-06-21 MED ORDER — DESONIDE 0.05 % EX CREA
TOPICAL_CREAM | Freq: Two times a day (BID) | CUTANEOUS | 1 refills | Status: AC | PRN
Start: 2020-06-21 — End: ?

## 2020-06-21 NOTE — Progress Notes (Signed)
   New Patient Visit  Subjective  Courtney Donovan Courtney Donovan is a 60 y.o. female who presents for the following: Rash (Neck and arms - comes up like a bunch of bumps - comes and goes).  The following portions of the chart were reviewed this encounter and updated as appropriate:   Tobacco  Allergies  Meds  Problems  Med Hx  Surg Hx  Fam Hx      Review of Systems:  No other skin or systemic complaints except as noted in HPI or Assessment and Plan.  Objective  Well appearing patient in no apparent distress; mood and affect are within normal limits.  A focused examination was performed including face, neck , arms. Relevant physical exam findings are noted in the Assessment and Plan.  Arms, neck Pink slightly lichenified patches with hyperpigmentation of arms. Photos of similar rash on neck  Face Hypopigmented macules  Left Lower Leg - Anterior, Left Thigh - Posterior Firm pink/brown papulenodule with dimple sign.    Assessment & Plan  Eczema versus contact dermatitis Arms, neck May consider patch testing vs biopsy in the future. Start Desonide cream bid x 2 weeks to affected areas  Atopic dermatitis (eczema) is a chronic, relapsing, pruritic condition that can significantly affect quality of life. It is often associated with allergic rhinitis and/or asthma and can require treatment with topical medications, phototherapy, or in severe cases a biologic medication called Dupixent in older children and adults.   desonide (DESOWEN) 0.05 % cream - Arms, neck Apply topically 2 (two) times daily as needed (Rash). For 2 weeks  Idiopathic guttate hypomelanosis Face arms Benign-appearing.  Observation.  Call clinic for new or changing lesions.  Recommend daily use of broad spectrum spf 30+ sunscreen to sun-exposed areas.   Dermatofibroma (2) Left Thigh - Posterior; Left Lower Leg - Anterior Benign-appearing.  Observation.  Call clinic for new or changing lesions.  Recommend  daily use of broad spectrum spf 30+ sunscreen to sun-exposed areas.   Return if symptoms worsen or fail to improve.  I, Ashok Cordia, CMA, am acting as scribe for Sarina Ser, MD .  Documentation: I have reviewed the above documentation for accuracy and completeness, and I agree with the above.  Sarina Ser, MD

## 2020-06-21 NOTE — Patient Instructions (Signed)

## 2020-06-22 ENCOUNTER — Encounter: Payer: Self-pay | Admitting: Dermatology

## 2020-07-27 ENCOUNTER — Encounter: Payer: Self-pay | Admitting: Internal Medicine

## 2020-07-27 NOTE — Telephone Encounter (Signed)
LMTCB

## 2020-07-28 NOTE — Telephone Encounter (Signed)
I can see her at 9:00 on 08/03/20.  Please schedule appt.  Any acute problems before will need to be evaluated.

## 2020-07-28 NOTE — Telephone Encounter (Signed)
Pt aware of below, screened and scheduled

## 2020-07-28 NOTE — Telephone Encounter (Signed)
Spoke with patient. She has not had another stool since yesterday morning so therefore she has not noticed anymore blood. Pt stated that the stool yesterday was not dark. Noticed a small amount of bright red blood. No abdominal pain, tenderness, nausea, vomiting, etc. Patient is having some issues with constipation so has related the little bit of blood this. Patient used an enema yesterday with success. She is established with [facility] so she is also going to call them and make them aware. Advised patient that if she notices anymore blood or develops any acute symptoms she will need to be evaluated more acutely.

## 2020-08-03 ENCOUNTER — Encounter: Payer: Self-pay | Admitting: Internal Medicine

## 2020-08-03 ENCOUNTER — Ambulatory Visit (INDEPENDENT_AMBULATORY_CARE_PROVIDER_SITE_OTHER): Payer: No Typology Code available for payment source | Admitting: Internal Medicine

## 2020-08-03 ENCOUNTER — Other Ambulatory Visit: Payer: Self-pay

## 2020-08-03 VITALS — BP 126/72 | HR 70 | Temp 97.9°F | Resp 16 | Ht 59.0 in | Wt 160.0 lb

## 2020-08-03 DIAGNOSIS — K625 Hemorrhage of anus and rectum: Secondary | ICD-10-CM | POA: Diagnosis not present

## 2020-08-03 DIAGNOSIS — E1165 Type 2 diabetes mellitus with hyperglycemia: Secondary | ICD-10-CM | POA: Diagnosis not present

## 2020-08-03 DIAGNOSIS — D649 Anemia, unspecified: Secondary | ICD-10-CM

## 2020-08-03 DIAGNOSIS — I10 Essential (primary) hypertension: Secondary | ICD-10-CM | POA: Diagnosis not present

## 2020-08-03 DIAGNOSIS — K219 Gastro-esophageal reflux disease without esophagitis: Secondary | ICD-10-CM

## 2020-08-03 DIAGNOSIS — F439 Reaction to severe stress, unspecified: Secondary | ICD-10-CM

## 2020-08-03 LAB — CBC WITH DIFFERENTIAL/PLATELET
Basophils Absolute: 0 10*3/uL (ref 0.0–0.1)
Basophils Relative: 0.2 % (ref 0.0–3.0)
Eosinophils Absolute: 0 10*3/uL (ref 0.0–0.7)
Eosinophils Relative: 0.7 % (ref 0.0–5.0)
HCT: 39.8 % (ref 36.0–46.0)
Hemoglobin: 13 g/dL (ref 12.0–15.0)
Lymphocytes Relative: 23.5 % (ref 12.0–46.0)
Lymphs Abs: 1.5 10*3/uL (ref 0.7–4.0)
MCHC: 32.6 g/dL (ref 30.0–36.0)
MCV: 88.7 fl (ref 78.0–100.0)
Monocytes Absolute: 0.3 10*3/uL (ref 0.1–1.0)
Monocytes Relative: 4.9 % (ref 3.0–12.0)
Neutro Abs: 4.4 10*3/uL (ref 1.4–7.7)
Neutrophils Relative %: 70.7 % (ref 43.0–77.0)
Platelets: 232 10*3/uL (ref 150.0–400.0)
RBC: 4.48 Mil/uL (ref 3.87–5.11)
RDW: 14.2 % (ref 11.5–15.5)
WBC: 6.2 10*3/uL (ref 4.0–10.5)

## 2020-08-03 LAB — HM DIABETES EYE EXAM

## 2020-08-03 MED ORDER — SERTRALINE HCL 25 MG PO TABS: 25.0000 mg | ORAL_TABLET | Freq: Every day | ORAL | 1 refills | Status: DC
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
## 2020-08-06 ENCOUNTER — Encounter: Payer: Self-pay | Admitting: Internal Medicine

## 2020-08-09 ENCOUNTER — Encounter: Payer: Self-pay | Admitting: Internal Medicine

## 2020-08-09 ENCOUNTER — Encounter (HOSPITAL_COMMUNITY): Payer: Self-pay | Admitting: *Deleted

## 2020-08-09 ENCOUNTER — Ambulatory Visit (HOSPITAL_COMMUNITY)
Admission: EM | Admit: 2020-08-09 | Discharge: 2020-08-09 | Disposition: A | Discharge status: Home / Self Care | Payer: No Typology Code available for payment source | Attending: Emergency Medicine | Admitting: Emergency Medicine

## 2020-08-09 ENCOUNTER — Other Ambulatory Visit: Payer: Self-pay

## 2020-08-09 DIAGNOSIS — Z20822 Contact with and (suspected) exposure to covid-19: Secondary | ICD-10-CM | POA: Diagnosis not present

## 2020-08-09 DIAGNOSIS — J069 Acute upper respiratory infection, unspecified: Secondary | ICD-10-CM | POA: Diagnosis present

## 2020-08-09 LAB — SARS CORONAVIRUS 2 (TAT 6-24 HRS): SARS Coronavirus 2: NEGATIVE

## 2020-08-09 MED ORDER — PROMETHAZINE-DM 6.25-15 MG/5ML PO SYRP: 5.0000 mL | ORAL_SOLUTION | Freq: Four times a day (QID) | ORAL | 0 refills | Status: DC | PRN

## 2020-08-09 MED ORDER — BENZONATATE 100 MG PO CAPS: 200.0000 mg | ORAL_CAPSULE | Freq: Three times a day (TID) | ORAL | 0 refills | Status: DC | PRN

## 2020-08-09 NOTE — Assessment & Plan Note (Signed)
Low carb diet and exercise.  Discussed.  She is watching her diet.  Follow met b and a1c.

## 2020-08-09 NOTE — Discharge Instructions (Addendum)
We will contact you if your COVID test is positive.  It will also show up in your MyChart.   You can take the Tessalon perles as needed for cough.  You can also take the Promethazine DM as needed for cough.  Do not take the Promethazine DM prior to driving as it can make you sleepy.    You can take Tylenol and/or Ibuprofen as needed for fever reduction and pain relief.   For cough: honey 1/2 to 1 teaspoon (you can dilute the honey in water or another fluid).  You can also use guaifenesin and dextromethorphan for cough. You can use a humidifier for chest congestion and cough.  If you don't have a humidifier, you can sit in the bathroom with the hot shower running.     For sore throat: try warm salt water gargles, cepacol lozenges, throat spray, warm tea or water with lemon/honey, popsicles or ice, or OTC cold relief medicine for throat discomfort.    For congestion: take a daily anti-histamine like Zyrtec, Claritin, and a oral decongestant, such as pseudoephedrine.  You can also use Flonase 1-2 sprays in each nostril daily.    It is important to stay hydrated: drink plenty of fluids (water, gatorade/powerade/pedialyte, juices, or teas) to keep your throat moisturized and help further relieve irritation/discomfort.   Return or go to the Emergency Department if symptoms worsen or do not improve in the next few days.

## 2020-08-09 NOTE — Assessment & Plan Note (Signed)
Has a history of anemia.  Recheck CBC today especially given the recent rectal bleeding.
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
## 2020-08-09 NOTE — ED Triage Notes (Signed)
Pt reports Sx's started last Tues. Pt has tried OTC with out relief. Pt tested Neg with an at home Test for COVID.

## 2020-08-09 NOTE — Assessment & Plan Note (Signed)
Last colonoscopy 11/2012 - internal hemorrhoids.  Given recent rectal bleeding, discussed referral back to GI.  She is agreeable.  Heme negative on exam today.  Check cbc.

## 2020-08-09 NOTE — Assessment & Plan Note (Signed)
Increased stress as outlined.  Discussed treatment options.  She feels she needs something to help level things out.  Discussed starting Zoloft.  She is agreeable.  We will start Zoloft 25 mg daily.  She want to start low-dose.  Schedule follow-up soon to reassess.  Call with update.

## 2020-08-09 NOTE — Assessment & Plan Note (Signed)
No upper symptoms reported. On prilosec.  

## 2020-08-09 NOTE — Assessment & Plan Note (Signed)
Continue amlodipine.  Blood pressure doing well.  No changes in medication.  Follow pressures.  Follow metabolic panel.  

## 2020-09-21 ENCOUNTER — Other Ambulatory Visit: Payer: Self-pay

## 2020-09-21 ENCOUNTER — Other Ambulatory Visit (INDEPENDENT_AMBULATORY_CARE_PROVIDER_SITE_OTHER): Payer: No Typology Code available for payment source

## 2020-09-21 DIAGNOSIS — E7800 Pure hypercholesterolemia, unspecified: Secondary | ICD-10-CM | POA: Diagnosis not present

## 2020-09-21 DIAGNOSIS — E1165 Type 2 diabetes mellitus with hyperglycemia: Secondary | ICD-10-CM

## 2020-09-21 DIAGNOSIS — I10 Essential (primary) hypertension: Secondary | ICD-10-CM | POA: Diagnosis not present

## 2020-09-21 LAB — BASIC METABOLIC PANEL
BUN: 15 mg/dL (ref 6–23)
CO2: 30 mEq/L (ref 19–32)
Calcium: 9.3 mg/dL (ref 8.4–10.5)
Chloride: 104 mEq/L (ref 96–112)
Creatinine, Ser: 0.94 mg/dL (ref 0.40–1.20)
GFR: 67.02 mL/min (ref >60.00)
Glucose, Bld: 106 mg/dL — ABNORMAL HIGH (ref 70–99)
Potassium: 4.1 mEq/L (ref 3.5–5.1)
Sodium: 140 mEq/L (ref 135–145)

## 2020-09-21 LAB — LIPID PANEL
Cholesterol: 161 mg/dL (ref 0–200)
HDL: 59.1 mg/dL (ref >39.00)
LDL Cholesterol: 92 mg/dL (ref 0–99)
NonHDL: 101.95
Total CHOL/HDL Ratio: 3
Triglycerides: 49 mg/dL (ref 0.0–149.0)
VLDL: 9.8 mg/dL (ref 0.0–40.0)

## 2020-09-21 LAB — HEPATIC FUNCTION PANEL
ALT: 12 U/L (ref 0–35)
AST: 12 U/L (ref 0–37)
Albumin: 4 g/dL (ref 3.5–5.2)
Alkaline Phosphatase: 78 U/L (ref 39–117)
Bilirubin, Direct: 0.1 mg/dL (ref 0.0–0.3)
Total Bilirubin: 0.4 mg/dL (ref 0.2–1.2)
Total Protein: 6.5 g/dL (ref 6.0–8.3)

## 2020-09-21 LAB — MICROALBUMIN / CREATININE URINE RATIO
Creatinine,U: 179.1 mg/dL
Microalb Creat Ratio: 0.4 mg/g (ref 0.0–30.0)
Microalb, Ur: <0.7 mg/dL (ref 0.0–1.9)

## 2020-09-21 LAB — HEMOGLOBIN A1C: Hgb A1c MFr Bld: 6.4 % (ref 4.6–6.5)

## 2020-09-21 LAB — TSH: TSH: 2.22 u[IU]/mL (ref 0.35–5.50)

## 2020-09-23 ENCOUNTER — Encounter: Payer: Self-pay | Admitting: Internal Medicine

## 2020-09-23 ENCOUNTER — Ambulatory Visit (INDEPENDENT_AMBULATORY_CARE_PROVIDER_SITE_OTHER): Payer: No Typology Code available for payment source | Admitting: Internal Medicine

## 2020-09-23 ENCOUNTER — Other Ambulatory Visit: Payer: Self-pay

## 2020-09-23 DIAGNOSIS — E7800 Pure hypercholesterolemia, unspecified: Secondary | ICD-10-CM

## 2020-09-23 DIAGNOSIS — K219 Gastro-esophageal reflux disease without esophagitis: Secondary | ICD-10-CM

## 2020-09-23 DIAGNOSIS — E559 Vitamin D deficiency, unspecified: Secondary | ICD-10-CM | POA: Diagnosis not present

## 2020-09-23 DIAGNOSIS — I10 Essential (primary) hypertension: Secondary | ICD-10-CM | POA: Diagnosis not present

## 2020-09-23 DIAGNOSIS — E1165 Type 2 diabetes mellitus with hyperglycemia: Secondary | ICD-10-CM

## 2020-09-23 DIAGNOSIS — F439 Reaction to severe stress, unspecified: Secondary | ICD-10-CM

## 2020-09-23 LAB — HM DIABETES FOOT EXAM

## 2020-09-23 MED ORDER — SERTRALINE HCL 50 MG PO TABS: 50.0000 mg | ORAL_TABLET | Freq: Every day | ORAL | 1 refills | Status: DC

## 2020-09-23 MED ORDER — LORAZEPAM 0.5 MG PO TABS: ORAL_TABLET | ORAL | 0 refills | Status: DC

## 2020-09-23 NOTE — Patient Instructions (Signed)
Take '25mg'$  zoloft x 10 days and then increase to '50mg'$  - one tablet per day.  I sent in a prescription for '50mg'$  tablets.

## 2020-09-23 NOTE — Progress Notes (Signed)
Patient ID: Courtney Donovan, female   DOB: Mar 03, 1960, 60 y.o.   MRN: [id number]   Subjective:    Patient ID: Courtney Donovan, female    DOB: 1960-03-06, 60 y.o.   MRN: [id number]  This visit occurred during the SARS-CoV-2 public health emergency.  Safety protocols were in place, including screening questions prior to the visit, additional usage of staff PPE, and extensive cleaning of exam room while observing appropriate contact time as indicated for disinfecting solutions.   Patient here for a  scheduled follow up.    HPI Here to follow up regarding her blood pressure, blood sugar and cholesterol.  Also increased stress.  Discussed zoloft.  Feels she needs something to have to take prn.  Discussed dosing of zoloft.  Tries to stay active.  No chest pain or sob reported.  No abdominal pain.  Bowels moving.  Had eye exam - [facility] 08/03/20.     Past Medical History:  Diagnosis Date   Allergy    Anemia    Chronic headaches    Diabetes mellitus ([facility])    diet controlled   Hypercholesterolemia    Hypertension    Past Surgical History:  Procedure Laterality Date   ABDOMINAL HYSTERECTOMY  2011   supracervical hysterectomy   TONSILECTOMY/ADENOIDECTOMY WITH MYRINGOTOMY  82   TONSILLECTOMY     TUBAL LIGATION  1996   Family History  Problem Relation Age of Onset   Hypertension Mother    Cancer Father        lung   Hypertension Father    Breast cancer Neg Hx    Social History   Socioeconomic History   Marital status: Married    Spouse name: Not on file   Number of children: 2   Years of education: Not on file   Highest education level: Not on file  Occupational History    Employer: [employer]  Tobacco Use   Smoking status: Never   Smokeless tobacco: Never  Vaping Use   Vaping Use: Never used  Substance and Sexual Activity   Alcohol use: No    Alcohol/week: 0.0 standard drinks   Drug use: No   Sexual activity: Yes  Other Topics  Concern   Not on file  Social History Narrative   Not on file   Social Determinants of Health   Financial Resource Strain: Not on file  Food Insecurity: Not on file  Transportation Needs: Not on file  Physical Activity: Not on file  Stress: Not on file  Social Connections: Not on file     Review of Systems  Constitutional:  Negative for appetite change and unexpected weight change.  HENT:  Negative for congestion and sinus pressure.   Respiratory:  Negative for cough, chest tightness and shortness of breath.   Cardiovascular:  Negative for chest pain, palpitations and leg swelling.  Gastrointestinal:  Negative for abdominal pain, diarrhea, nausea and vomiting.  Genitourinary:  Negative for difficulty urinating and dysuria.  Musculoskeletal:  Negative for joint swelling and myalgias.  Skin:  Negative for color change and rash.  Neurological:  Negative for dizziness, light-headedness and headaches.  Psychiatric/Behavioral:  Negative for agitation and dysphoric mood.        Increased stress.        Objective:     BP 120/72   Pulse 76   Temp 98.7 F (37.1 C)   Resp (!) 99   Ht 4' 11.02" (1.499 m)   Wt 164 lb (74.4  kg)   BMI 33.11 kg/m  Wt Readings from Last 3 Encounters:  09/23/20 164 lb (74.4 kg)  08/03/20 160 lb (72.6 kg)  06/11/20 162 lb (73.5 kg)    Physical Exam Vitals reviewed.  Constitutional:      General: She is not in acute distress.    Appearance: Normal appearance.  HENT:     Head: Normocephalic and atraumatic.     Right Ear: External ear normal.     Left Ear: External ear normal.  Eyes:     General: No scleral icterus.       Right eye: No discharge.        Left eye: No discharge.     Conjunctiva/sclera: Conjunctivae normal.  Neck:     Thyroid: No thyromegaly.  Cardiovascular:     Rate and Rhythm: Normal rate and regular rhythm.  Pulmonary:     Effort: No respiratory distress.     Breath sounds: Normal breath sounds. No wheezing.   Abdominal:     General: Bowel sounds are normal.     Palpations: Abdomen is soft.     Tenderness: There is no abdominal tenderness.  Musculoskeletal:        General: No swelling or tenderness.     Cervical back: Neck supple. No tenderness.  Lymphadenopathy:     Cervical: No cervical adenopathy.  Skin:    Findings: No erythema or rash.  Neurological:     Mental Status: She is alert.  Psychiatric:        Mood and Affect: Mood normal.        Behavior: Behavior normal.     Outpatient Encounter Medications as of 09/23/2020  Medication Sig   amLODipine (NORVASC) 5 MG tablet TAKE 1 TABLET BY MOUTH  DAILY   aspirin 81 MG chewable tablet Chew 81 mg by mouth daily.   benzonatate (TESSALON PERLES) 100 MG capsule Take 2 capsules (200 mg total) by mouth 3 (three) times daily as needed for cough.   Continuous Blood Gluc Transmit (DEXCOM G6 TRANSMITTER) MISC Apply 1 Device topically every 14 (fourteen) days.   desonide (DESOWEN) 0.05 % cream Apply topically 2 (two) times daily as needed (Rash). For 2 weeks   fluticasone (FLONASE) 50 MCG/ACT nasal spray Place 2 sprays into both nostrils daily.   LORazepam (ATIVAN) 0.5 MG tablet 1/2 tablet q day prn   metFORMIN (GLUCOPHAGE-XR) 500 MG 24 hr tablet TAKE 1 TABLET BY MOUTH  DAILY   Multiple Vitamin (MULTIVITAMIN) tablet Take 1 tablet by mouth daily.   nystatin cream (MYCOSTATIN) Apply 1 application topically 2 (two) times daily.   omeprazole (PRILOSEC) 40 MG capsule TAKE 1 CAPSULE BY MOUTH  DAILY   Probiotic Product (ALIGN) 4 MG CAPS Take one tablet q day   promethazine-dextromethorphan (PROMETHAZINE-DM) 6.25-15 MG/5ML syrup Take 5 mLs by mouth 4 (four) times daily as needed for cough.   Rimegepant Sulfate 75 MG TBDP Take 1 tablet by mouth as needed.   rizatriptan (MAXALT) 10 MG tablet Take 1 tablet by mouth as needed.   rosuvastatin (CRESTOR) 10 MG tablet TAKE 1 TABLET BY MOUTH  DAILY   sertraline (ZOLOFT) 50 MG tablet Take 1 tablet (50 mg total)  by mouth daily.   sucralfate (CARAFATE) 1 g tablet TAKE 1 TABLET BY MOUTH THREE TIMES DAILY BEFORE MEAL(S)   [DISCONTINUED] sertraline (ZOLOFT) 25 MG tablet Take 1 tablet (25 mg total) by mouth daily.   No facility-administered encounter medications on file as of 09/23/2020.  Lab Results  Component Value Date   WBC 6.2 08/03/2020   HGB 13.0 08/03/2020   HCT 39.8 08/03/2020   PLT 232.0 08/03/2020   GLUCOSE 106 (H) 09/21/2020   CHOL 161 09/21/2020   TRIG 49.0 09/21/2020   HDL 59.10 09/21/2020   LDLDIRECT 159.5 01/16/2013   LDLCALC 92 09/21/2020   ALT 12 09/21/2020   AST 12 09/21/2020   NA 140 09/21/2020   K 4.1 09/21/2020   CL 104 09/21/2020   CREATININE 0.94 09/21/2020   BUN 15 09/21/2020   CO2 30 09/21/2020   TSH 2.22 09/21/2020   HGBA1C 6.4 09/21/2020   MICROALBUR <0.7 09/21/2020       Assessment & Plan:   Problem List Items Addressed This Visit     Diabetes mellitus ([place])    Low carb diet and exercise.  Follow met b and a1c.  Up to date with eye exam - 08/03/20 - ok.   Lab Results  Component Value Date   HGBA1C 6.4 09/21/2020       GERD (gastroesophageal reflux disease)    No upper symptoms reported.  On prilosec.       Hypercholesterolemia    Continue crestor.  Low cholesterol diet and exercise.  Follow lipid panel and liver function tests.   Lab Results  Component Value Date   CHOL 161 09/21/2020   HDL 59.10 09/21/2020   LDLCALC 92 09/21/2020   LDLDIRECT 159.5 01/16/2013   TRIG 49.0 09/21/2020   CHOLHDL 3 09/21/2020       Hypertension    Continue amlodipine.  Blood pressure doing well.  No changes in medication.  Follow pressures.  Follow metabolic panel.       Stress    Increased stress as outlined.  Discussed.  We will increase Zoloft to 50 mg daily.  Prescription given for lorazepam to have if needed.  Schedule follow-up soon to reassess.  Follow.  Call with update.      Vitamin D deficiency    Continue vitamin D supplements.         Einar Pheasant, MD

## 2020-10-02 ENCOUNTER — Encounter: Payer: Self-pay | Admitting: Internal Medicine

## 2020-10-02 NOTE — Assessment & Plan Note (Signed)
Continue vitamin D supplements.  

## 2020-10-02 NOTE — Assessment & Plan Note (Signed)
No upper symptoms reported. On prilosec.  

## 2020-10-02 NOTE — Assessment & Plan Note (Addendum)
Increased stress as outlined.  Discussed.  We will increase Zoloft to 50 mg daily.  Prescription given for lorazepam to have if needed.  Schedule follow-up soon to reassess.  Follow.  Call with update.

## 2020-10-02 NOTE — Assessment & Plan Note (Signed)
Low carb diet and exercise.  Follow met b and a1c.  Up to date with eye exam - 08/03/20 - ok.   Lab Results  Component Value Date   HGBA1C 6.4 09/21/2020

## 2020-10-02 NOTE — Assessment & Plan Note (Signed)
Continue crestor.  Low cholesterol diet and exercise.  Follow lipid panel and liver function tests.   Lab Results  Component Value Date   CHOL 161 09/21/2020   HDL 59.10 09/21/2020   LDLCALC 92 09/21/2020   LDLDIRECT 159.5 01/16/2013   TRIG 49.0 09/21/2020   CHOLHDL 3 09/21/2020

## 2020-10-02 NOTE — Assessment & Plan Note (Signed)
Continue amlodipine.  Blood pressure doing well.  No changes in medication.  Follow pressures.  Follow metabolic panel.  

## 2020-10-05 ENCOUNTER — Ambulatory Visit: Payer: No Typology Code available for payment source | Admitting: Internal Medicine

## 2020-11-02 ENCOUNTER — Encounter: Payer: Self-pay | Admitting: Internal Medicine

## 2020-11-09 ENCOUNTER — Telehealth: Payer: Self-pay

## 2020-11-09 MED ORDER — SCOPOLAMINE 1 MG/3DAYS TD PT72: 1.0000 | MEDICATED_PATCH | TRANSDERMAL | 0 refills | Status: DC

## 2020-11-09 NOTE — Telephone Encounter (Signed)
I have sent in the prescription for scopolamine patches.  Form completed.  Please notify pt when can get form.

## 2020-11-09 NOTE — Telephone Encounter (Signed)
Form placed up front for pick up. Patient is aware.

## 2020-11-12 NOTE — Telephone Encounter (Signed)
Patient has been notified

## 2020-12-20 ENCOUNTER — Other Ambulatory Visit: Payer: Self-pay

## 2020-12-20 ENCOUNTER — Ambulatory Visit (INDEPENDENT_AMBULATORY_CARE_PROVIDER_SITE_OTHER): Payer: No Typology Code available for payment source | Admitting: Internal Medicine

## 2020-12-20 ENCOUNTER — Encounter: Payer: Self-pay | Admitting: Internal Medicine

## 2020-12-20 DIAGNOSIS — E1165 Type 2 diabetes mellitus with hyperglycemia: Secondary | ICD-10-CM

## 2020-12-20 DIAGNOSIS — Z1231 Encounter for screening mammogram for malignant neoplasm of breast: Secondary | ICD-10-CM

## 2020-12-20 DIAGNOSIS — K219 Gastro-esophageal reflux disease without esophagitis: Secondary | ICD-10-CM | POA: Diagnosis not present

## 2020-12-20 DIAGNOSIS — R102 Pelvic and perineal pain: Secondary | ICD-10-CM

## 2020-12-20 DIAGNOSIS — E7800 Pure hypercholesterolemia, unspecified: Secondary | ICD-10-CM | POA: Diagnosis not present

## 2020-12-20 DIAGNOSIS — E559 Vitamin D deficiency, unspecified: Secondary | ICD-10-CM

## 2020-12-20 DIAGNOSIS — F439 Reaction to severe stress, unspecified: Secondary | ICD-10-CM

## 2020-12-20 DIAGNOSIS — I10 Essential (primary) hypertension: Secondary | ICD-10-CM | POA: Diagnosis not present

## 2020-12-20 DIAGNOSIS — E611 Iron deficiency: Secondary | ICD-10-CM

## 2020-12-20 NOTE — Progress Notes (Signed)
Patient ID: Courtney Donovan, female   DOB: [birth year], 60 y.o.   MRN: [id number]   Subjective:    Patient ID: Courtney Donovan, female    DOB: May 11, 1960, 60 y.o.   MRN: [id number]  This visit occurred during the SARS-CoV-2 public health emergency.  Safety protocols were in place, including screening questions prior to the visit, additional usage of staff PPE, and extensive cleaning of exam room while observing appropriate contact time as indicated for disinfecting solutions.   Patient here for a scheduled follow up.   Chief Complaint  Patient presents with   Hypertension   Diabetes   .   HPI Overall doing relatively well.  Discussed increased stress.  On zoloft.  Has lorazepam if needed.  Tries to stay active.  No chest pain or sob reported.  No abdominal pain or bowel change reported.  Does report some lower pelvic pain.  Persistent.  No urine change or vaginal issues.     Past Medical History:  Diagnosis Date   Allergy    Anemia    Chronic headaches    Diabetes mellitus (HCC)    diet controlled   Hypercholesterolemia    Hypertension    Past Surgical History:  Procedure Laterality Date   ABDOMINAL HYSTERECTOMY  2011   supracervical hysterectomy   TONSILECTOMY/ADENOIDECTOMY WITH MYRINGOTOMY  13   TONSILLECTOMY     TUBAL LIGATION  1996   Family History  Problem Relation Age of Onset   Hypertension Mother    Cancer Father        lung   Hypertension Father    Breast cancer Neg Hx    Social History   Socioeconomic History   Marital status: Married    Spouse name: Not on file   Number of children: 2   Years of education: Not on file   Highest education level: Not on file  Occupational History    Employer: [employer]  Tobacco Use   Smoking status: Never   Smokeless tobacco: Never  Vaping Use   Vaping Use: Never used  Substance and Sexual Activity   Alcohol use: No    Alcohol/week: 0.0 standard drinks   Drug use: No   Sexual activity: Yes   Other Topics Concern   Not on file  Social History Narrative   Not on file   Social Determinants of Health   Financial Resource Strain: Not on file  Food Insecurity: Not on file  Transportation Needs: Not on file  Physical Activity: Not on file  Stress: Not on file  Social Connections: Not on file     Review of Systems  Constitutional:  Negative for appetite change and unexpected weight change.  HENT:  Negative for congestion and sinus pressure.   Respiratory:  Negative for cough, chest tightness and shortness of breath.   Cardiovascular:  Negative for chest pain, palpitations and leg swelling.  Gastrointestinal:  Negative for abdominal pain, diarrhea, nausea and vomiting.       Lower pelvic pain.   Genitourinary:  Negative for difficulty urinating and dysuria.  Musculoskeletal:  Negative for joint swelling and myalgias.  Skin:  Negative for color change and rash.  Neurological:  Negative for dizziness, light-headedness and headaches.  Psychiatric/Behavioral:  Negative for agitation and dysphoric mood.       Objective:     BP 126/72   Pulse 79   Temp 97.8 F (36.6 C)   Resp 16   Ht '4\' 11"'  (1.499 m)  Wt 172 lb (78 kg)   SpO2 99%   BMI 34.74 kg/m  Wt Readings from Last 3 Encounters:  12/20/20 172 lb (78 kg)  09/23/20 164 lb (74.4 kg)  08/03/20 160 lb (72.6 kg)    Physical Exam Vitals reviewed.  Constitutional:      General: She is not in acute distress.    Appearance: Normal appearance.  HENT:     Head: Normocephalic and atraumatic.     Right Ear: External ear normal.     Left Ear: External ear normal.  Eyes:     General: No scleral icterus.       Right eye: No discharge.        Left eye: No discharge.     Conjunctiva/sclera: Conjunctivae normal.  Neck:     Thyroid: No thyromegaly.  Cardiovascular:     Rate and Rhythm: Normal rate and regular rhythm.  Pulmonary:     Effort: No respiratory distress.     Breath sounds: Normal breath sounds. No  wheezing.  Abdominal:     General: Bowel sounds are normal.     Palpations: Abdomen is soft.     Tenderness: There is no abdominal tenderness.  Musculoskeletal:        General: No swelling or tenderness.     Cervical back: Neck supple. No tenderness.  Lymphadenopathy:     Cervical: No cervical adenopathy.  Skin:    Findings: No erythema or rash.  Neurological:     Mental Status: She is alert.  Psychiatric:        Mood and Affect: Mood normal.        Behavior: Behavior normal.     Outpatient Encounter Medications as of 12/20/2020  Medication Sig   amLODipine (NORVASC) 5 MG tablet TAKE 1 TABLET BY MOUTH  DAILY   aspirin 81 MG chewable tablet Chew 81 mg by mouth daily.   benzonatate (TESSALON PERLES) 100 MG capsule Take 2 capsules (200 mg total) by mouth 3 (three) times daily as needed for cough.   Continuous Blood Gluc Transmit (DEXCOM G6 TRANSMITTER) MISC Apply 1 Device topically every 14 (fourteen) days.   desonide (DESOWEN) 0.05 % cream Apply topically 2 (two) times daily as needed (Rash). For 2 weeks   fluticasone (FLONASE) 50 MCG/ACT nasal spray Place 2 sprays into both nostrils daily.   LORazepam (ATIVAN) 0.5 MG tablet 1/2 tablet q day prn   metFORMIN (GLUCOPHAGE-XR) 500 MG 24 hr tablet TAKE 1 TABLET BY MOUTH  DAILY   Multiple Vitamin (MULTIVITAMIN) tablet Take 1 tablet by mouth daily.   nystatin cream (MYCOSTATIN) Apply 1 application topically 2 (two) times daily.   omeprazole (PRILOSEC) 40 MG capsule TAKE 1 CAPSULE BY MOUTH  DAILY   Probiotic Product (ALIGN) 4 MG CAPS Take one tablet q day   promethazine-dextromethorphan (PROMETHAZINE-DM) 6.25-15 MG/5ML syrup Take 5 mLs by mouth 4 (four) times daily as needed for cough.   Rimegepant Sulfate 75 MG TBDP Take 1 tablet by mouth as needed.   rizatriptan (MAXALT) 10 MG tablet Take 1 tablet by mouth as needed.   rosuvastatin (CRESTOR) 10 MG tablet TAKE 1 TABLET BY MOUTH  DAILY   scopolamine (TRANSDERM-SCOP) 1 MG/3DAYS Place 1  patch (1.5 mg total) onto the skin every 3 (three) days.   sertraline (ZOLOFT) 50 MG tablet Take 1 tablet (50 mg total) by mouth daily.   sucralfate (CARAFATE) 1 g tablet TAKE 1 TABLET BY MOUTH THREE TIMES DAILY BEFORE MEAL(S)   No facility-administered  encounter medications on file as of 12/20/2020.     Lab Results  Component Value Date   WBC 6.2 08/03/2020   HGB 13.0 08/03/2020   HCT 39.8 08/03/2020   PLT 232.0 08/03/2020   GLUCOSE 106 (H) 09/21/2020   CHOL 161 09/21/2020   TRIG 49.0 09/21/2020   HDL 59.10 09/21/2020   LDLDIRECT 159.5 01/16/2013   LDLCALC 92 09/21/2020   ALT 12 09/21/2020   AST 12 09/21/2020   NA 140 09/21/2020   K 4.1 09/21/2020   CL 104 09/21/2020   CREATININE 0.94 09/21/2020   BUN 15 09/21/2020   CO2 30 09/21/2020   TSH 2.22 09/21/2020   HGBA1C 6.4 09/21/2020   MICROALBUR <0.7 09/21/2020       Assessment & Plan:   Problem List Items Addressed This Visit     Diabetes mellitus ([place])    Low carb diet and exercise.  Follow met b and a1c.  Up to date with eye exam - 08/03/20 - ok.   Lab Results  Component Value Date   HGBA1C 6.4 09/21/2020       Relevant Orders   Hemoglobin A1c   GERD (gastroesophageal reflux disease)    No upper symptoms reported.  On prilosec.       Hypercholesterolemia    Continue crestor.  Low cholesterol diet and exercise.  Follow lipid panel and liver function tests.   Lab Results  Component Value Date   CHOL 161 09/21/2020   HDL 59.10 09/21/2020   LDLCALC 92 09/21/2020   LDLDIRECT 159.5 01/16/2013   TRIG 49.0 09/21/2020   CHOLHDL 3 09/21/2020       Relevant Orders   Lipid panel   Hepatic function panel   Hypertension    Continue amlodipine.  Blood pressure doing well.  No changes in medication.  Follow pressures.  Follow metabolic panel.       Relevant Orders   Basic metabolic panel   Iron deficiency    Saw GI.  Hemoccult cards negative.  Follow cbc and iron studies.       Pelvic pain    Persistent  pelvic pain.  Schedule pelvic ultrasound.       Relevant Orders   US Pelvic Complete With Transvaginal   Stress    Continue zoloft.  Lorazepam prn.  Overall stable.  Follow.       Vitamin D deficiency    Continue vitamin D supplements.        Other Visit Diagnoses     Visit for screening mammogram       Relevant Orders   MM 3D SCREEN BREAST BILATERAL        Einar Pheasant, MD

## 2020-12-25 ENCOUNTER — Encounter: Payer: Self-pay | Admitting: Internal Medicine

## 2020-12-25 DIAGNOSIS — R102 Pelvic and perineal pain: Secondary | ICD-10-CM | POA: Insufficient documentation

## 2020-12-25 NOTE — Assessment & Plan Note (Signed)
Continue zoloft.  Lorazepam prn.  Overall stable.  Follow.

## 2020-12-25 NOTE — Assessment & Plan Note (Signed)
Persistent pelvic pain.  Schedule pelvic ultrasound.

## 2020-12-25 NOTE — Assessment & Plan Note (Signed)
Continue amlodipine.  Blood pressure doing well.  No changes in medication.  Follow pressures.  Follow metabolic panel.  

## 2020-12-25 NOTE — Assessment & Plan Note (Signed)
Continue vitamin D supplements.  

## 2020-12-25 NOTE — Assessment & Plan Note (Signed)
Low carb diet and exercise.  Follow met b and a1c.  Up to date with eye exam - 08/03/20 - ok.   Lab Results  Component Value Date   HGBA1C 6.4 09/21/2020

## 2020-12-25 NOTE — Assessment & Plan Note (Signed)
No upper symptoms reported. On prilosec.  

## 2020-12-25 NOTE — Assessment & Plan Note (Signed)
Saw GI.  Hemoccult cards negative.  Follow cbc and iron studies.  

## 2020-12-25 NOTE — Assessment & Plan Note (Signed)
Continue crestor.  Low cholesterol diet and exercise.  Follow lipid panel and liver function tests.   Lab Results  Component Value Date   CHOL 161 09/21/2020   HDL 59.10 09/21/2020   LDLCALC 92 09/21/2020   LDLDIRECT 159.5 01/16/2013   TRIG 49.0 09/21/2020   CHOLHDL 3 09/21/2020

## 2020-12-30 ENCOUNTER — Telehealth: Payer: Self-pay | Admitting: Internal Medicine

## 2020-12-30 NOTE — Telephone Encounter (Signed)
She is unable to do her Ultra Sound, she has a mammogram at the same time. Please call her.

## 2021-01-04 ENCOUNTER — Other Ambulatory Visit: Payer: Self-pay

## 2021-01-04 ENCOUNTER — Ambulatory Visit
Admission: RE | Admit: 2021-01-04 | Discharge: 2021-01-04 | Disposition: A | Discharge status: Home / Self Care | Payer: No Typology Code available for payment source | Source: Ambulatory Visit | Attending: Internal Medicine | Admitting: Internal Medicine

## 2021-01-04 ENCOUNTER — Ambulatory Visit: Payer: No Typology Code available for payment source

## 2021-01-04 DIAGNOSIS — Z1231 Encounter for screening mammogram for malignant neoplasm of breast: Secondary | ICD-10-CM | POA: Insufficient documentation

## 2021-01-04 DIAGNOSIS — R102 Pelvic and perineal pain: Secondary | ICD-10-CM | POA: Diagnosis present

## 2021-01-26 ENCOUNTER — Other Ambulatory Visit: Payer: Self-pay | Admitting: Internal Medicine

## 2021-02-01 ENCOUNTER — Other Ambulatory Visit: Payer: Self-pay

## 2021-02-01 ENCOUNTER — Other Ambulatory Visit (INDEPENDENT_AMBULATORY_CARE_PROVIDER_SITE_OTHER): Payer: No Typology Code available for payment source

## 2021-02-01 DIAGNOSIS — E1165 Type 2 diabetes mellitus with hyperglycemia: Secondary | ICD-10-CM | POA: Diagnosis not present

## 2021-02-01 DIAGNOSIS — E7800 Pure hypercholesterolemia, unspecified: Secondary | ICD-10-CM | POA: Diagnosis not present

## 2021-02-01 DIAGNOSIS — I10 Essential (primary) hypertension: Secondary | ICD-10-CM | POA: Diagnosis not present

## 2021-02-01 LAB — BASIC METABOLIC PANEL
BUN: 14 mg/dL (ref 6–23)
CO2: 32 mEq/L (ref 19–32)
Calcium: 9.4 mg/dL (ref 8.4–10.5)
Chloride: 103 mEq/L (ref 96–112)
Creatinine, Ser: 0.83 mg/dL (ref 0.40–1.20)
GFR: 77.62 mL/min (ref >60.00)
Glucose, Bld: 111 mg/dL — ABNORMAL HIGH (ref 70–99)
Potassium: 4.1 mEq/L (ref 3.5–5.1)
Sodium: 140 mEq/L (ref 135–145)

## 2021-02-01 LAB — LIPID PANEL
Cholesterol: 193 mg/dL (ref 0–200)
HDL: 46.9 mg/dL (ref >39.00)
LDL Cholesterol: 131 mg/dL — ABNORMAL HIGH (ref 0–99)
NonHDL: 146.45
Total CHOL/HDL Ratio: 4
Triglycerides: 77 mg/dL (ref 0.0–149.0)
VLDL: 15.4 mg/dL (ref 0.0–40.0)

## 2021-02-01 LAB — HEPATIC FUNCTION PANEL
ALT: 12 U/L (ref 0–35)
AST: 14 U/L (ref 0–37)
Albumin: 4.4 g/dL (ref 3.5–5.2)
Alkaline Phosphatase: 79 U/L (ref 39–117)
Bilirubin, Direct: 0.1 mg/dL (ref 0.0–0.3)
Total Bilirubin: 0.5 mg/dL (ref 0.2–1.2)
Total Protein: 6.9 g/dL (ref 6.0–8.3)

## 2021-02-01 LAB — HEMOGLOBIN A1C: Hgb A1c MFr Bld: 6.5 % (ref 4.6–6.5)

## 2021-02-03 ENCOUNTER — Other Ambulatory Visit: Payer: Self-pay

## 2021-02-03 ENCOUNTER — Telehealth: Payer: Self-pay | Admitting: Internal Medicine

## 2021-02-03 MED ORDER — ROSUVASTATIN CALCIUM 20 MG PO TABS: 20.0000 mg | ORAL_TABLET | Freq: Every day | ORAL | 3 refills | Status: DC

## 2021-02-03 NOTE — Telephone Encounter (Signed)
Pt called in stating that she took an at home Covid test. Pt stated that test came back positive. Pt stated that she is going to [facility] to take another Covid test to confirm. Pt stated that her glucose at fasting is 150. Pt stated she has a terrible headache & cough. Pt would like to know if Dr. Nicki Reaper will prescribe her something. Pt requesting callback.

## 2021-02-03 NOTE — Telephone Encounter (Signed)
Scheduled with NP tomorrow to discuss. Confirmed doing ok

## 2021-02-04 ENCOUNTER — Encounter: Payer: Self-pay | Admitting: Family

## 2021-02-04 ENCOUNTER — Other Ambulatory Visit: Payer: Self-pay

## 2021-02-04 ENCOUNTER — Telehealth (INDEPENDENT_AMBULATORY_CARE_PROVIDER_SITE_OTHER): Payer: No Typology Code available for payment source | Admitting: Family

## 2021-02-04 VITALS — Temp 97.5°F | Ht 59.0 in | Wt 162.0 lb

## 2021-02-04 DIAGNOSIS — J069 Acute upper respiratory infection, unspecified: Secondary | ICD-10-CM | POA: Diagnosis not present

## 2021-02-04 DIAGNOSIS — U071 COVID-19: Secondary | ICD-10-CM | POA: Diagnosis not present

## 2021-02-04 DIAGNOSIS — R051 Acute cough: Secondary | ICD-10-CM | POA: Insufficient documentation

## 2021-02-04 MED ORDER — AMOXICILLIN-POT CLAVULANATE 875-125 MG PO TABS: 1.0000 | ORAL_TABLET | Freq: Two times a day (BID) | ORAL | 0 refills | Status: DC

## 2021-02-04 MED ORDER — PROMETHAZINE-DM 6.25-15 MG/5ML PO SYRP: ORAL_SOLUTION | ORAL | 0 refills | Status: DC

## 2021-02-04 MED ORDER — BENZONATATE 200 MG PO CAPS: 200.0000 mg | ORAL_CAPSULE | Freq: Two times a day (BID) | ORAL | 0 refills | Status: DC | PRN

## 2021-02-04 MED ORDER — MOLNUPIRAVIR EUA 200MG CAPSULE: 4.0000 | ORAL_CAPSULE | Freq: Two times a day (BID) | ORAL | 0 refills | Status: AC

## 2021-02-04 NOTE — Progress Notes (Signed)
MyChart Video Visit    Virtual Visit via Video Note   This visit type was conducted due to national recommendations for restrictions regarding the COVID-19 Pandemic (e.g. social distancing) in an effort to limit this patient's exposure and mitigate transmission in our community. This patient is at least at moderate risk for complications without adequate follow up. This format is felt to be most appropriate for this patient at this time. Physical exam was limited by quality of the video and audio technology used for the visit. CMA was able to get the patient set up on a video visit.  Patient location: Home. Patient and provider in visit Provider location: Office  I discussed the limitations of evaluation and management by telemedicine and the availability of in person appointments. The patient expressed understanding and agreed to proceed.  Visit Date: 02/04/2021  Today's healthcare provider: Eugenia Pancoast, FNP    Subjective:  Patient ID: Courtney Donovan, female    DOB: 1960-11-18  Age: 61 y.o. MRN: [id number]  CC:  Chief Complaint  Patient presents with   Covid Positive   Cough   Sore Throat   Generalized Body Aches   Fatigue   Headache   Nasal Congestion    HPI Willye Javier is here today with concerns via video visit.   She c/o sore throat, body aches, fatigue, headache and nasal congestion. Clear productive cough with chest congestion. She states she is coughing so much her stomach and back hurts even to walk. She is achy from the coughing itself. She denies fever at current, but did did have one two days ago at 48 F. Didn't feel warm yesterday either.  She denies h/o asthma. She does have diabetes as well as HTN.   Past Medical History:  Diagnosis Date   Allergy    Anemia    Chronic headaches    Diabetes mellitus (HCC)    diet controlled   Hypercholesterolemia    Hypertension     Past Surgical History:  Procedure Laterality  Date   ABDOMINAL HYSTERECTOMY  2011   supracervical hysterectomy   TONSILECTOMY/ADENOIDECTOMY WITH MYRINGOTOMY  50   TONSILLECTOMY     TUBAL LIGATION  1996    Family History  Problem Relation Age of Onset   Hypertension Mother    Cancer Father        lung   Hypertension Father    Breast cancer Neg Hx     Social History   Socioeconomic History   Marital status: Married    Spouse name: Not on file   Number of children: 2   Years of education: Not on file   Highest education level: Not on file  Occupational History    Employer: [employer]  Tobacco Use   Smoking status: Never   Smokeless tobacco: Never  Vaping Use   Vaping Use: Never used  Substance and Sexual Activity   Alcohol use: No    Alcohol/week: 0.0 standard drinks   Drug use: No   Sexual activity: Yes  Other Topics Concern   Not on file  Social History Narrative   Not on file   Social Determinants of Health   Financial Resource Strain: Not on file  Food Insecurity: Not on file  Transportation Needs: Not on file  Physical Activity: Not on file  Stress: Not on file  Social Connections: Not on file  Intimate Partner Violence: Not on file    Outpatient Medications Prior to Visit  Medication Sig Dispense  Refill   amLODipine (NORVASC) 5 MG tablet TAKE 1 TABLET BY MOUTH  DAILY 90 tablet 3   aspirin 81 MG chewable tablet Chew 81 mg by mouth daily.     Continuous Blood Gluc Transmit (DEXCOM G6 TRANSMITTER) MISC Apply 1 Device topically every 14 (fourteen) days.     desonide (DESOWEN) 0.05 % cream Apply topically 2 (two) times daily as needed (Rash). For 2 weeks 60 g 1   fluticasone (FLONASE) 50 MCG/ACT nasal spray Place 2 sprays into both nostrils daily. 48 g 3   LORazepam (ATIVAN) 0.5 MG tablet 1/2 tablet q day prn 20 tablet 0   metFORMIN (GLUCOPHAGE-XR) 500 MG 24 hr tablet TAKE 1 TABLET BY MOUTH  DAILY 90 tablet 3   Multiple Vitamin (MULTIVITAMIN) tablet Take 1 tablet by mouth daily.     nystatin cream  (MYCOSTATIN) Apply 1 application topically 2 (two) times daily. 30 g 0   omeprazole (PRILOSEC) 40 MG capsule TAKE 1 CAPSULE BY MOUTH  DAILY 90 capsule 3   Probiotic Product (ALIGN) 4 MG CAPS Take one tablet q day 30 capsule 0   Rimegepant Sulfate 75 MG TBDP Take 1 tablet by mouth as needed.     rizatriptan (MAXALT) 10 MG tablet Take 1 tablet by mouth as needed.     rosuvastatin (CRESTOR) 20 MG tablet Take 1 tablet (20 mg total) by mouth daily. 90 tablet 3   scopolamine (TRANSDERM-SCOP) 1 MG/3DAYS Place 1 patch (1.5 mg total) onto the skin every 3 (three) days. 10 patch 0   sertraline (ZOLOFT) 50 MG tablet Take 1 tablet (50 mg total) by mouth daily. 30 tablet 1   sucralfate (CARAFATE) 1 g tablet TAKE 1 TABLET BY MOUTH THREE TIMES DAILY BEFORE MEAL(S) 90 tablet 0   benzonatate (TESSALON PERLES) 100 MG capsule Take 2 capsules (200 mg total) by mouth 3 (three) times daily as needed for cough. 20 capsule 0   promethazine-dextromethorphan (PROMETHAZINE-DM) 6.25-15 MG/5ML syrup Take 5 mLs by mouth 4 (four) times daily as needed for cough. 118 mL 0   No facility-administered medications prior to visit.    Allergies  Allergen Reactions   Atorvastatin Other (See Comments)    Other reaction(s): Muscle Pain   Metrogel [Metronidazole]    Pravastatin     Other reaction(s): Muscle Pain CANT TAKE MYALGIAS   Latex Rash    ROS Review of Systems  Constitutional:  Positive for fatigue and fever (taking tylenol). Negative for chills.  HENT:  Positive for congestion and sore throat. Negative for ear pain and sinus pressure.   Respiratory:  Positive for cough and shortness of breath (with coughing fits). Negative for wheezing.   Cardiovascular:  Negative for chest pain and palpitations.  Musculoskeletal:  Positive for arthralgias (body aches) and back pain (from coughing).     Objective:    Physical Exam Constitutional:      General: She is not in acute distress.    Appearance: She is  well-developed. She is not ill-appearing, toxic-appearing or diaphoretic.  HENT:     Head: Normocephalic.  Pulmonary:     Effort: Pulmonary effort is normal.  Neurological:     General: No focal deficit present.     Mental Status: She is alert and oriented to person, place, and time.  Psychiatric:        Mood and Affect: Mood normal.        Behavior: Behavior normal.    Temp (!) 97.5 F (36.4 C) (Temporal)  Ht 4\' 11"  (1.499 m)    Wt 162 lb (73.5 kg)    BMI 32.72 kg/m  Wt Readings from Last 3 Encounters:  02/04/21 162 lb (73.5 kg)  12/20/20 172 lb (78 kg)  09/23/20 164 lb (74.4 kg)     Health Maintenance Due  Topic Date Due   HIV Screening  Never done   Hepatitis C Screening  Never done   Zoster Vaccines- Shingrix (1 of 2) Never done   COVID-19 Vaccine (4 - Booster for Pfizer series) 06/10/2020    There are no preventive care reminders to display for this patient.  Lab Results  Component Value Date   TSH 2.22 09/21/2020   Lab Results  Component Value Date   WBC 6.2 08/03/2020   HGB 13.0 08/03/2020   HCT 39.8 08/03/2020   MCV 88.7 08/03/2020   PLT 232.0 08/03/2020   Lab Results  Component Value Date   NA 140 02/01/2021   K 4.1 02/01/2021   CO2 32 02/01/2021   GLUCOSE 111 (H) 02/01/2021   BUN 14 02/01/2021   CREATININE 0.83 02/01/2021   BILITOT 0.5 02/01/2021   ALKPHOS 79 02/01/2021   AST 14 02/01/2021   ALT 12 02/01/2021   PROT 6.9 02/01/2021   ALBUMIN 4.4 02/01/2021   CALCIUM 9.4 02/01/2021   ANIONGAP 4 (L) 12/11/2011   GFR 77.62 02/01/2021   Lab Results  Component Value Date   HGBA1C 6.5 02/01/2021      Assessment & Plan:   Problem List Items Addressed This Visit       Other   COVID-19    rx for antiviral, pmp , no chance of pregnancy.  rx augmentin as well for fever/back pain/ concerned for progression pneumonia.  Pt is a high risk patient due to DM and HTN and obesity.   Advised of CDC guidelines for self isolation/ ending isolation.   Advised of safe practice guidelines. Symptom Tier reviewed.  Encouraged to monitor for any worsening symptoms; watch for increased shortness of breath, weakness, and signs of dehydration. Advised when to seek emergency care.  Instructed to rest and hydrate well.  Advised to leave the house during recommended isolation period, only if it is necessary to seek medical care       Relevant Medications   molnupiravir EUA (LAGEVRIO) 200 mg CAPS capsule   Acute cough - Primary    Benzonatate prn during the day, and also cough syrup for the night.       Relevant Medications   promethazine-dextromethorphan (PROMETHAZINE-DM) 6.25-15 MG/5ML syrup   benzonatate (TESSALON) 200 MG capsule   Other Visit Diagnoses     Upper respiratory infection, acute       Relevant Medications   amoxicillin-clavulanate (AUGMENTIN) 875-125 MG tablet   molnupiravir EUA (LAGEVRIO) 200 mg CAPS capsule       Meds ordered this encounter  Medications   promethazine-dextromethorphan (PROMETHAZINE-DM) 6.25-15 MG/5ML syrup    Sig: Take 5 ml po qhs for cough    Dispense:  118 mL    Refill:  0    Order Specific Question:   Supervising Provider    Answer:   BEDSOLE, AMY E [[id number]]   benzonatate (TESSALON) 200 MG capsule    Sig: Take 1 capsule (200 mg total) by mouth 2 (two) times daily as needed for cough.    Dispense:  20 capsule    Refill:  0    Order Specific Question:   Supervising Provider    Answer:  BEDSOLE, AMY E [[id number]]   amoxicillin-clavulanate (AUGMENTIN) 875-125 MG tablet    Sig: Take 1 tablet by mouth 2 (two) times daily.    Dispense:  20 tablet    Refill:  0    Order Specific Question:   Supervising Provider    Answer:   BEDSOLE, AMY E [[id number]]   molnupiravir EUA (LAGEVRIO) 200 mg CAPS capsule    Sig: Take 4 capsules (800 mg total) by mouth 2 (two) times daily for 5 days.    Dispense:  40 capsule    Refill:  0    Order Specific Question:   Supervising Provider    Answer:   BEDSOLE, AMY E [[id number]]     Follow-up: No follow-ups on file.    Eugenia Pancoast, FNP

## 2021-02-04 NOTE — Assessment & Plan Note (Signed)
Benzonatate prn during the day, and also cough syrup for the night.

## 2021-02-04 NOTE — Assessment & Plan Note (Signed)
rx for antiviral, pmp , no chance of pregnancy.  rx augmentin as well for fever/back pain/ concerned for progression pneumonia.  Pt is a high risk patient due to DM and HTN and obesity.   Advised of CDC guidelines for self isolation/ ending isolation.  Advised of safe practice guidelines. Symptom Tier reviewed.  Encouraged to monitor for any worsening symptoms; watch for increased shortness of breath, weakness, and signs of dehydration. Advised when to seek emergency care.  Instructed to rest and hydrate well.  Advised to leave the house during recommended isolation period, only if it is necessary to seek medical care

## 2021-03-04 ENCOUNTER — Telehealth: Payer: Self-pay | Admitting: Internal Medicine

## 2021-03-04 NOTE — Telephone Encounter (Signed)
Pt called in stating that she would like to start vitamin d-3. Pt was wondering what is the correct dosage to take when taking vitamin d-3. Pt stated she have vitamin d-3 136mcg - 5,000iu. Pt is not sure if that is the correct amount to take. Pt requesting callback.

## 2021-03-04 NOTE — Telephone Encounter (Signed)
Per chart patient is supposed to be on vit D 1000 units per day

## 2021-03-04 NOTE — Telephone Encounter (Signed)
LMTCB

## 2021-03-04 NOTE — Telephone Encounter (Signed)
Pt returning call

## 2021-03-12 ENCOUNTER — Encounter: Payer: Self-pay | Admitting: Internal Medicine

## 2021-03-13 ENCOUNTER — Telehealth: Payer: Self-pay | Admitting: Internal Medicine

## 2021-03-13 NOTE — Telephone Encounter (Signed)
My char message sent to pt about changing appt.  ?

## 2021-03-14 ENCOUNTER — Ambulatory Visit (INDEPENDENT_AMBULATORY_CARE_PROVIDER_SITE_OTHER): Payer: No Typology Code available for payment source | Admitting: Internal Medicine

## 2021-03-14 ENCOUNTER — Other Ambulatory Visit: Payer: Self-pay

## 2021-03-14 VITALS — BP 132/78 | HR 76 | Temp 97.9°F | Resp 16 | Ht 59.0 in | Wt 169.4 lb

## 2021-03-14 DIAGNOSIS — Z0000 Encounter for general adult medical examination without abnormal findings: Secondary | ICD-10-CM

## 2021-03-14 DIAGNOSIS — Z1211 Encounter for screening for malignant neoplasm of colon: Secondary | ICD-10-CM | POA: Diagnosis not present

## 2021-03-14 DIAGNOSIS — F439 Reaction to severe stress, unspecified: Secondary | ICD-10-CM

## 2021-03-14 DIAGNOSIS — E1165 Type 2 diabetes mellitus with hyperglycemia: Secondary | ICD-10-CM | POA: Diagnosis not present

## 2021-03-14 DIAGNOSIS — R102 Pelvic and perineal pain: Secondary | ICD-10-CM

## 2021-03-14 DIAGNOSIS — E7800 Pure hypercholesterolemia, unspecified: Secondary | ICD-10-CM | POA: Diagnosis not present

## 2021-03-14 DIAGNOSIS — R079 Chest pain, unspecified: Secondary | ICD-10-CM

## 2021-03-14 DIAGNOSIS — E559 Vitamin D deficiency, unspecified: Secondary | ICD-10-CM

## 2021-03-14 DIAGNOSIS — E611 Iron deficiency: Secondary | ICD-10-CM

## 2021-03-14 DIAGNOSIS — I10 Essential (primary) hypertension: Secondary | ICD-10-CM

## 2021-03-14 DIAGNOSIS — K219 Gastro-esophageal reflux disease without esophagitis: Secondary | ICD-10-CM

## 2021-03-14 NOTE — Progress Notes (Unsigned)
Patient ID: Courtney Donovan, female   DOB: [birth year], 61 y.o.   MRN: [id number]   Subjective:    Patient ID: Courtney Donovan, female    DOB: 01/31/1960, 61 y.o.   MRN: [id number]  This visit occurred during the SARS-CoV-2 public health emergency.  Safety protocols were in place, including screening questions prior to the visit, additional usage of staff PPE, and extensive cleaning of exam room while observing appropriate contact time as indicated for disinfecting solutions.   Patient here for her physical exam.    Chief Complaint  Patient presents with   Annual Exam   .   HPI She reports she is doing relatively well.  Has been studying.  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  Some increased stress related to this, but overall she feels she is handling things relatively well.  No chest pain or sob reported.  No abdominal pain or bowel change reported.  No pelvic pain.  Occasionally - when working will notice some chest discomfort.  Was questioning if related to positioning.  Denies any chest pain with increased activity or exertion.     Past Medical History:  Diagnosis Date   Allergy    Anemia    Chronic headaches    Diabetes mellitus (HCC)    diet controlled   Hypercholesterolemia    Hypertension    Past Surgical History:  Procedure Laterality Date   ABDOMINAL HYSTERECTOMY  2011   supracervical hysterectomy   TONSILECTOMY/ADENOIDECTOMY WITH MYRINGOTOMY  31   TONSILLECTOMY     TUBAL LIGATION  1996   Family History  Problem Relation Age of Onset   Hypertension Mother    Cancer Father        lung   Hypertension Father    Breast cancer Neg Hx    Social History   Socioeconomic History   Marital status: Married    Spouse name: Not on file   Number of children: 2   Years of education: Not on file   Highest education level: Not on file  Occupational History    Employer: [employer]  Tobacco Use   Smoking status: Never   Smokeless  tobacco: Never  Vaping Use   Vaping Use: Never used  Substance and Sexual Activity   Alcohol use: No    Alcohol/week: 0.0 standard drinks   Drug use: No   Sexual activity: Yes  Other Topics Concern   Not on file  Social History Narrative   Not on file   Social Determinants of Health   Financial Resource Strain: Not on file  Food Insecurity: Not on file  Transportation Needs: Not on file  Physical Activity: Not on file  Stress: Not on file  Social Connections: Not on file     Review of Systems  Constitutional:  Negative for appetite change and unexpected weight change.  HENT:  Negative for congestion, sinus pressure and sore throat.   Eyes:  Negative for pain and visual disturbance.  Respiratory:  Negative for cough, chest tightness and shortness of breath.   Cardiovascular:  Positive for chest pain. Negative for palpitations and leg swelling.  Gastrointestinal:  Negative for abdominal pain, diarrhea, nausea and vomiting.  Genitourinary:  Negative for difficulty urinating and dysuria.  Musculoskeletal:  Negative for back pain and joint swelling.  Skin:  Negative for color change and rash.  Neurological:  Negative for dizziness, light-headedness and headaches.  Hematological:  Negative for adenopathy. Does not bruise/bleed easily.  Psychiatric/Behavioral:  Negative for agitation and dysphoric mood.       Objective:     BP 132/78    Pulse 76    Temp 97.9 F (36.6 C)    Resp 16    Ht '4\' 11"'$  (1.499 m)    Wt 169 lb 6.4 oz (76.8 kg)    SpO2 99%    BMI 34.21 kg/m  Wt Readings from Last 3 Encounters:  03/14/21 169 lb 6.4 oz (76.8 kg)  02/04/21 162 lb (73.5 kg)  12/20/20 172 lb (78 kg)    Physical Exam Vitals reviewed.  Constitutional:      General: She is not in acute distress.    Appearance: Normal appearance. She is well-developed.  HENT:     Head: Normocephalic and atraumatic.     Right Ear: External ear normal.     Left Ear: External ear normal.  Eyes:      General: No scleral icterus.       Right eye: No discharge.        Left eye: No discharge.     Conjunctiva/sclera: Conjunctivae normal.  Neck:     Thyroid: No thyromegaly.  Cardiovascular:     Rate and Rhythm: Normal rate and regular rhythm.  Pulmonary:     Effort: No tachypnea, accessory muscle usage or respiratory distress.     Breath sounds: Normal breath sounds. No decreased breath sounds or wheezing.  Chest:  Breasts:    Right: No inverted nipple, mass, nipple discharge or tenderness (no axillary adenopathy).     Left: No inverted nipple, mass, nipple discharge or tenderness (no axilarry adenopathy).  Abdominal:     General: Bowel sounds are normal.     Palpations: Abdomen is soft.     Tenderness: There is no abdominal tenderness.  Musculoskeletal:        General: No swelling or tenderness.     Cervical back: Neck supple.  Lymphadenopathy:     Cervical: No cervical adenopathy.  Skin:    Findings: No erythema or rash.  Neurological:     Mental Status: She is alert and oriented to person, place, and time.  Psychiatric:        Mood and Affect: Mood normal.        Behavior: Behavior normal.     Outpatient Encounter Medications as of 03/14/2021  Medication Sig   amLODipine (NORVASC) 5 MG tablet TAKE 1 TABLET BY MOUTH  DAILY   aspirin 81 MG chewable tablet Chew 81 mg by mouth daily.   Continuous Blood Gluc Transmit (DEXCOM G6 TRANSMITTER) MISC Apply 1 Device topically every 14 (fourteen) days.   desonide (DESOWEN) 0.05 % cream Apply topically 2 (two) times daily as needed (Rash). For 2 weeks   fluticasone (FLONASE) 50 MCG/ACT nasal spray Place 2 sprays into both nostrils daily.   LORazepam (ATIVAN) 0.5 MG tablet 1/2 tablet q day prn   metFORMIN (GLUCOPHAGE-XR) 500 MG 24 hr tablet TAKE 1 TABLET BY MOUTH  DAILY   Multiple Vitamin (MULTIVITAMIN) tablet Take 1 tablet by mouth daily.   nystatin cream (MYCOSTATIN) Apply 1 application topically 2 (two) times daily.   omeprazole  (PRILOSEC) 40 MG capsule TAKE 1 CAPSULE BY MOUTH  DAILY   Probiotic Product (ALIGN) 4 MG CAPS Take one tablet q day   promethazine-dextromethorphan (PROMETHAZINE-DM) 6.25-15 MG/5ML syrup Take 5 ml po qhs for cough   Rimegepant Sulfate 75 MG TBDP Take 1 tablet by mouth as needed.   rizatriptan (MAXALT) 10 MG tablet Take  1 tablet by mouth as needed.   rosuvastatin (CRESTOR) 20 MG tablet Take 1 tablet (20 mg total) by mouth daily.   scopolamine (TRANSDERM-SCOP) 1 MG/3DAYS Place 1 patch (1.5 mg total) onto the skin every 3 (three) days.   sertraline (ZOLOFT) 50 MG tablet Take 1 tablet (50 mg total) by mouth daily.   sucralfate (CARAFATE) 1 g tablet TAKE 1 TABLET BY MOUTH THREE TIMES DAILY BEFORE MEAL(S)   [DISCONTINUED] amoxicillin-clavulanate (AUGMENTIN) 875-125 MG tablet Take 1 tablet by mouth 2 (two) times daily.   [DISCONTINUED] benzonatate (TESSALON) 200 MG capsule Take 1 capsule (200 mg total) by mouth 2 (two) times daily as needed for cough.   No facility-administered encounter medications on file as of 03/14/2021.     Lab Results  Component Value Date   WBC 6.2 08/03/2020   HGB 13.0 08/03/2020   HCT 39.8 08/03/2020   PLT 232.0 08/03/2020   GLUCOSE 111 (H) 02/01/2021   CHOL 193 02/01/2021   TRIG 77.0 02/01/2021   HDL 46.90 02/01/2021   LDLDIRECT 159.5 01/16/2013   LDLCALC 131 (H) 02/01/2021   ALT 12 02/01/2021   AST 14 02/01/2021   NA 140 02/01/2021   K 4.1 02/01/2021   CL 103 02/01/2021   CREATININE 0.83 02/01/2021   BUN 14 02/01/2021   CO2 32 02/01/2021   TSH 2.22 09/21/2020   HGBA1C 6.5 02/01/2021   MICROALBUR <0.7 09/21/2020    MM 3D SCREEN BREAST BILATERAL  Result Date: 01/05/2021 CLINICAL DATA:  Screening. EXAM: DIGITAL SCREENING BILATERAL MAMMOGRAM WITH TOMOSYNTHESIS AND CAD TECHNIQUE: Bilateral screening digital craniocaudal and mediolateral oblique mammograms were obtained. Bilateral screening digital breast tomosynthesis was performed. The images were evaluated  with computer-aided detection. COMPARISON:  Previous exam(s). ACR Breast Density Category b: There are scattered areas of fibroglandular density. FINDINGS: There are no findings suspicious for malignancy. IMPRESSION: No mammographic evidence of malignancy. A result letter of this screening mammogram will be mailed directly to the patient. RECOMMENDATION: Screening mammogram in one year. (Code:SM-B-01Y) BI-RADS CATEGORY  1: Negative. Electronically Signed   By: Margarette Canada M.D.   On: 01/05/2021 09:01  US Pelvic Complete With Transvaginal  Result Date: 01/04/2021 CLINICAL DATA:  Pelvic pain, hysterectomy EXAM: TRANSABDOMINAL AND TRANSVAGINAL ULTRASOUND OF PELVIS TECHNIQUE: Both transabdominal and transvaginal ultrasound examinations of the pelvis were performed. Transabdominal technique was performed for global imaging of the pelvis including uterus, ovaries, adnexal regions, and pelvic cul-de-sac. It was necessary to proceed with endovaginal exam following the transabdominal exam to visualize the adnexal structures. COMPARISON:  None FINDINGS: Uterus Surgically absent. Right ovary Measurements: 2.2 x 1.3 x 1.3 cm = volume: 2.0 mL. Normal appearance/no adnexal mass. Left ovary Measurements: 2.1 x 1.4 x 1.5 cm = volume: 2.1 mL. Normal appearance/no adnexal mass. Other findings No abnormal free fluid. IMPRESSION: 1. Status post hysterectomy. 2. Unremarkable ovaries. Electronically Signed   By: Randa Ngo M.D.   On: 01/04/2021 23:18       Assessment & Plan:   Problem List Items Addressed This Visit   None Visit Diagnoses     Colon cancer screening    -  Primary   Relevant Orders   Fecal occult blood, imunochemical        Einar Pheasant, MD

## 2021-03-21 ENCOUNTER — Encounter: Payer: No Typology Code available for payment source | Admitting: Internal Medicine

## 2021-03-22 ENCOUNTER — Encounter: Payer: Self-pay | Admitting: Internal Medicine

## 2021-03-22 DIAGNOSIS — R079 Chest pain, unspecified: Secondary | ICD-10-CM | POA: Insufficient documentation

## 2021-03-22 NOTE — Assessment & Plan Note (Signed)
Continue crestor.  Low cholesterol diet and exercise.  Follow lipid panel and liver function tests.   ?Lab Results  ?Component Value Date  ? CHOL 193 02/01/2021  ? HDL 46.90 02/01/2021  ? LDLCALC 131 (H) 02/01/2021  ? LDLDIRECT 159.5 01/16/2013  ? TRIG 77.0 02/01/2021  ? CHOLHDL 4 02/01/2021  ? ?

## 2021-03-22 NOTE — Assessment & Plan Note (Signed)
Low carb diet and exercise.  Follow met b and a1c.  Up to date with eye exam - 08/03/20 - ok.   ?Lab Results  ?Component Value Date  ? HGBA1C 6.5 02/01/2021  ? ?

## 2021-03-22 NOTE — Assessment & Plan Note (Signed)
Continue vitamin D supplements.  Check vitamin D level with next labs.  

## 2021-03-22 NOTE — Assessment & Plan Note (Signed)
No upper symptoms reported. On prilosec.  

## 2021-03-22 NOTE — Assessment & Plan Note (Signed)
Notices when sitting - certain positions.  Discussed further w/up.  Wants to monitor.  Notify if symptoms change/worsen.   ?

## 2021-03-22 NOTE — Assessment & Plan Note (Signed)
Continue amlodipine.  Blood pressure doing well.  No changes in medication.  Follow pressures.  Follow metabolic panel.  

## 2021-03-22 NOTE — Assessment & Plan Note (Signed)
Previous pelvic pain.  Pelvic ultrasound unrevealing.  Reports no significant pain now.  Follow.  ?

## 2021-03-22 NOTE — Assessment & Plan Note (Signed)
Saw GI.  Hemoccult cards negative.  Follow cbc and iron studies.  

## 2021-03-22 NOTE — Assessment & Plan Note (Signed)
Physical today 03/14/21.  Mammogram 01/04/21 - [facility] I. colonoscopy 11/2012.  Recommended f/u in 10 years.  Hemoccult cards negative.  ?

## 2021-03-22 NOTE — Assessment & Plan Note (Signed)
Continue zoloft.  Lorazepam prn.  Overall stable.  Follow.  ?

## 2021-04-20 LAB — HM DIABETES EYE EXAM

## 2021-05-04 ENCOUNTER — Other Ambulatory Visit: Payer: Self-pay | Admitting: Internal Medicine

## 2021-05-09 ENCOUNTER — Other Ambulatory Visit: Payer: Self-pay

## 2021-05-09 ENCOUNTER — Encounter: Payer: Self-pay | Admitting: Internal Medicine

## 2021-05-09 MED ORDER — OMEPRAZOLE 40 MG PO CPDR: 40.0000 mg | DELAYED_RELEASE_CAPSULE | Freq: Every day | ORAL | 3 refills | Status: DC

## 2021-05-09 NOTE — Telephone Encounter (Signed)
S/w pt - stated she used a medicated douche yesterday and today she is having some vaginal irritation. ?Slight burning, no itchiness, no discharge.  ?Does not burn when she urinates, unless it hits her skin.  ?Pt scheduled for appt tomorrow at 11am ?

## 2021-05-10 ENCOUNTER — Ambulatory Visit (INDEPENDENT_AMBULATORY_CARE_PROVIDER_SITE_OTHER): Payer: No Typology Code available for payment source | Admitting: Internal Medicine

## 2021-05-10 ENCOUNTER — Other Ambulatory Visit (HOSPITAL_COMMUNITY)
Admission: RE | Admit: 2021-05-10 | Discharge: 2021-05-10 | Disposition: A | Discharge status: Home / Self Care | Payer: No Typology Code available for payment source | Source: Ambulatory Visit | Attending: Internal Medicine | Admitting: Internal Medicine

## 2021-05-10 ENCOUNTER — Encounter: Payer: Self-pay | Admitting: Internal Medicine

## 2021-05-10 VITALS — BP 114/70 | HR 65 | Temp 98.0°F | Ht 59.0 in | Wt 173.0 lb

## 2021-05-10 DIAGNOSIS — E1165 Type 2 diabetes mellitus with hyperglycemia: Secondary | ICD-10-CM | POA: Diagnosis not present

## 2021-05-10 DIAGNOSIS — N760 Acute vaginitis: Secondary | ICD-10-CM | POA: Insufficient documentation

## 2021-05-10 DIAGNOSIS — I10 Essential (primary) hypertension: Secondary | ICD-10-CM

## 2021-05-10 DIAGNOSIS — N898 Other specified noninflammatory disorders of vagina: Secondary | ICD-10-CM

## 2021-05-10 MED ORDER — NYSTATIN 100000 UNIT/GM EX CREA: 1.0000 "application " | TOPICAL_CREAM | Freq: Two times a day (BID) | CUTANEOUS | 0 refills | Status: DC

## 2021-05-10 NOTE — Progress Notes (Signed)
Patient ID: Courtney Donovan, female   DOB: [birth year], 60 y.o.   MRN: [id number] ? ? ?Subjective:  ? ? Patient ID: Courtney Donovan, female    DOB: Aug 17, 1960, 61 y.o.   MRN: [id number] ? ?This visit occurred during the SARS-CoV-2 public health emergency.  Safety protocols were in place, including screening questions prior to the visit, additional usage of staff PPE, and extensive cleaning of exam room while observing appropriate contact time as indicated for disinfecting solutions.  ? ?Patient here for work in appt.   ? ?Chief Complaint  ?Patient presents with  ? Acute Visit  ?  Vaginal discomfort  ? .  ? ?HPI ?Symptoms started 5 days ago.  Vaginal irritation.  No significant discharge.  No fever. No abdominal pain.  No urinary symptoms.   ? ? ?Past Medical History:  ?Diagnosis Date  ? Allergy   ? Anemia   ? Chronic headaches   ? Diabetes mellitus ([place])   ? diet controlled  ? Hypercholesterolemia   ? Hypertension   ? ?Past Surgical History:  ?Procedure Laterality Date  ? ABDOMINAL HYSTERECTOMY  2011  ? supracervical hysterectomy  ? TONSILECTOMY/ADENOIDECTOMY WITH MYRINGOTOMY  1067  ? TONSILLECTOMY    ? TUBAL LIGATION  1996  ? ?Family History  ?Problem Relation Age of Onset  ? Hypertension Mother   ? Cancer Father   ?     lung  ? Hypertension Father   ? Breast cancer Neg Hx   ? ?Social History  ? ?Socioeconomic History  ? Marital status: Married  ?  Spouse name: Not on file  ? Number of children: 2  ? Years of education: Not on file  ? Highest education level: Not on file  ?Occupational History  ?  Employer: [employer]  ?Tobacco Use  ? Smoking status: Never  ? Smokeless tobacco: Never  ?Vaping Use  ? Vaping Use: Never used  ?Substance and Sexual Activity  ? Alcohol use: No  ?  Alcohol/week: 0.0 standard drinks  ? Drug use: No  ? Sexual activity: Yes  ?Other Topics Concern  ? Not on file  ?Social History Narrative  ? Not on file  ? ?Social Determinants of Health  ? ?Financial Resource Strain: Not on  file  ?Food Insecurity: Not on file  ?Transportation Needs: Not on file  ?Physical Activity: Not on file  ?Stress: Not on file  ?Social Connections: Not on file  ? ? ? ?Review of Systems  ?Constitutional:  Negative for appetite change, fever and unexpected weight change.  ?Respiratory:  Negative for cough, chest tightness and shortness of breath.   ?Cardiovascular:  Negative for chest pain, palpitations and leg swelling.  ?Gastrointestinal:  Negative for abdominal pain, nausea and vomiting.  ?Genitourinary:  Negative for difficulty urinating and dysuria.  ?Musculoskeletal:  Negative for joint swelling and myalgias.  ?Skin:  Negative for color change and rash.  ?Neurological:  Negative for dizziness and headaches.  ?Psychiatric/Behavioral:  Negative for agitation and dysphoric mood.   ? ?   ?Objective:  ?  ? ?BP 114/70 (BP Location: Left Arm, Patient Position: Sitting, Cuff Size: Large)   Pulse 65   Temp 98 ?F (36.7 ?C) (Oral)   Ht '4\' 11"'$  (1.499 m)   Wt 173 lb (78.5 kg)   SpO2 99%   BMI 34.94 kg/m?  ?Wt Readings from Last 3 Encounters:  ?05/10/21 173 lb (78.5 kg)  ?03/14/21 169 lb 6.4 oz (76.8 kg)  ?02/04/21 162 lb (73.5 kg)  ? ? ?  Physical Exam ?Vitals reviewed.  ?Constitutional:   ?   General: She is not in acute distress. ?   Appearance: Normal appearance.  ?HENT:  ?   Head: Normocephalic and atraumatic.  ?   Right Ear: External ear normal.  ?   Left Ear: External ear normal.  ?Eyes:  ?   General: No scleral icterus.    ?   Right eye: No discharge.     ?   Left eye: No discharge.  ?   Conjunctiva/sclera: Conjunctivae normal.  ?Neck:  ?   Thyroid: No thyromegaly.  ?Cardiovascular:  ?   Rate and Rhythm: Normal rate and regular rhythm.  ?Pulmonary:  ?   Effort: No respiratory distress.  ?   Breath sounds: Normal breath sounds. No wheezing.  ?Abdominal:  ?   General: Bowel sounds are normal.  ?   Palpations: Abdomen is soft.  ?   Tenderness: There is no abdominal tenderness.  ?Genitourinary: ?   Comments: Normal  external genitalia.  Vaginal vault without lesions.  KOH/Wet prep obtained.  No lesions. Could not appreciate any adnexal masses or tenderness.   ?Musculoskeletal:     ?   General: No swelling or tenderness.  ?   Cervical back: Neck supple. No tenderness.  ?Lymphadenopathy:  ?   Cervical: No cervical adenopathy.  ?Skin: ?   Findings: No erythema or rash.  ?Neurological:  ?   Mental Status: She is alert.  ?Psychiatric:     ?   Mood and Affect: Mood normal.     ?   Behavior: Behavior normal.  ? ? ? ?Outpatient Encounter Medications as of 05/10/2021  ?Medication Sig  ? amLODipine (NORVASC) 5 MG tablet TAKE 1 TABLET BY MOUTH  DAILY  ? aspirin 81 MG chewable tablet Chew 81 mg by mouth daily.  ? Continuous Blood Gluc Transmit (DEXCOM G6 TRANSMITTER) MISC Apply 1 Device topically every 14 (fourteen) days.  ? desonide (DESOWEN) 0.05 % cream Apply topically 2 (two) times daily as needed (Rash). For 2 weeks  ? fluticasone (FLONASE) 50 MCG/ACT nasal spray Place 2 sprays into both nostrils daily.  ? LORazepam (ATIVAN) 0.5 MG tablet 1/2 tablet q day prn  ? metFORMIN (GLUCOPHAGE-XR) 500 MG 24 hr tablet TAKE 1 TABLET BY MOUTH  DAILY  ? Multiple Vitamin (MULTIVITAMIN) tablet Take 1 tablet by mouth daily.  ? omeprazole (PRILOSEC) 40 MG capsule Take 1 capsule (40 mg total) by mouth daily.  ? Probiotic Product (ALIGN) 4 MG CAPS Take one tablet q day  ? promethazine-dextromethorphan (PROMETHAZINE-DM) 6.25-15 MG/5ML syrup Take 5 ml po qhs for cough  ? Rimegepant Sulfate 75 MG TBDP Take 1 tablet by mouth as needed.  ? rizatriptan (MAXALT) 10 MG tablet Take 1 tablet by mouth as needed.  ? rosuvastatin (CRESTOR) 20 MG tablet Take 1 tablet (20 mg total) by mouth daily.  ? scopolamine (TRANSDERM-SCOP) 1 MG/3DAYS Place 1 patch (1.5 mg total) onto the skin every 3 (three) days.  ? sertraline (ZOLOFT) 50 MG tablet Take 1 tablet (50 mg total) by mouth daily.  ? sucralfate (CARAFATE) 1 g tablet TAKE 1 TABLET BY MOUTH THREE TIMES DAILY BEFORE  MEAL(S)  ? [DISCONTINUED] nystatin cream (MYCOSTATIN) Apply 1 application topically 2 (two) times daily.  ? nystatin cream (MYCOSTATIN) Apply 1 application. topically 2 (two) times daily.  ? ?No facility-administered encounter medications on file as of 05/10/2021.  ?  ? ?Lab Results  ?Component Value Date  ? WBC 5.6 05/18/2021  ?  HGB 12.7 05/18/2021  ? HCT 38.7 05/18/2021  ? PLT 218.0 05/18/2021  ? GLUCOSE 112 (H) 05/18/2021  ? CHOL 164 05/18/2021  ? TRIG 88.0 05/18/2021  ? HDL 52.10 05/18/2021  ? LDLDIRECT 159.5 01/16/2013  ? [place] 94 05/18/2021  ? ALT 11 05/18/2021  ? AST 12 05/18/2021  ? NA 138 05/18/2021  ? K 4.3 05/18/2021  ? CL 102 05/18/2021  ? CREATININE 0.92 05/18/2021  ? BUN 18 05/18/2021  ? CO2 30 05/18/2021  ? TSH 2.22 09/21/2020  ? HGBA1C 6.6 (H) 05/18/2021  ? MICROALBUR <0.7 09/21/2020  ? ? ?MM 3D SCREEN BREAST BILATERAL ? ?Result Date: 01/05/2021 ?CLINICAL DATA:  Screening. EXAM: DIGITAL SCREENING BILATERAL MAMMOGRAM WITH TOMOSYNTHESIS AND CAD TECHNIQUE: Bilateral screening digital craniocaudal and mediolateral oblique mammograms were obtained. Bilateral screening digital breast tomosynthesis was performed. The images were evaluated with computer-aided detection. COMPARISON:  Previous exam(s). ACR Breast Density Category b: There are scattered areas of fibroglandular density. FINDINGS: There are no findings suspicious for malignancy. IMPRESSION: No mammographic evidence of malignancy. A result letter of this screening mammogram will be mailed directly to the patient. RECOMMENDATION: Screening mammogram in one year. (Code:SM-B-01Y) BI-RADS CATEGORY  1: Negative. Electronically Signed   By: Margarette Canada M.D.   On: 01/05/2021 09:01 ? ?US Pelvic Complete With Transvaginal ? ?Result Date: 01/04/2021 ?CLINICAL DATA:  Pelvic pain, hysterectomy EXAM: TRANSABDOMINAL AND TRANSVAGINAL ULTRASOUND OF PELVIS TECHNIQUE: Both transabdominal and transvaginal ultrasound examinations of the pelvis were performed.  Transabdominal technique was performed for global imaging of the pelvis including uterus, ovaries, adnexal regions, and pelvic cul-de-sac. It was necessary to proceed with endovaginal exam following the transabdominal e

## 2021-05-11 LAB — CERVICOVAGINAL ANCILLARY ONLY
Bacterial Vaginitis (gardnerella): POSITIVE — AB
Candida Glabrata: NEGATIVE
Candida Vaginitis: NEGATIVE
Chlamydia: NEGATIVE
Comment: NEGATIVE
Comment: NEGATIVE
Comment: NEGATIVE
Comment: NEGATIVE
Comment: NEGATIVE
Comment: NORMAL
Neisseria Gonorrhea: NEGATIVE
Trichomonas: NEGATIVE

## 2021-05-12 ENCOUNTER — Encounter: Payer: Self-pay | Admitting: Internal Medicine

## 2021-05-12 ENCOUNTER — Other Ambulatory Visit: Payer: Self-pay

## 2021-05-12 MED ORDER — CLINDAMYCIN PHOSPHATE 2 % VA CREA: 1.0000 | TOPICAL_CREAM | Freq: Every day | VAGINAL | 0 refills | Status: DC

## 2021-05-18 ENCOUNTER — Other Ambulatory Visit (INDEPENDENT_AMBULATORY_CARE_PROVIDER_SITE_OTHER): Payer: No Typology Code available for payment source

## 2021-05-18 DIAGNOSIS — E7800 Pure hypercholesterolemia, unspecified: Secondary | ICD-10-CM | POA: Diagnosis not present

## 2021-05-18 DIAGNOSIS — E1165 Type 2 diabetes mellitus with hyperglycemia: Secondary | ICD-10-CM | POA: Diagnosis not present

## 2021-05-18 DIAGNOSIS — E611 Iron deficiency: Secondary | ICD-10-CM | POA: Diagnosis not present

## 2021-05-18 LAB — CBC WITH DIFFERENTIAL/PLATELET
Basophils Absolute: 0 10*3/uL (ref 0.0–0.1)
Basophils Relative: 0.2 % (ref 0.0–3.0)
Eosinophils Absolute: 0.1 10*3/uL (ref 0.0–0.7)
Eosinophils Relative: 1.1 % (ref 0.0–5.0)
HCT: 38.7 % (ref 36.0–46.0)
Hemoglobin: 12.7 g/dL (ref 12.0–15.0)
Lymphocytes Relative: 33.9 % (ref 12.0–46.0)
Lymphs Abs: 1.9 10*3/uL (ref 0.7–4.0)
MCHC: 32.8 g/dL (ref 30.0–36.0)
MCV: 88.5 fl (ref 78.0–100.0)
Monocytes Absolute: 0.4 10*3/uL (ref 0.1–1.0)
Monocytes Relative: 6.4 % (ref 3.0–12.0)
Neutro Abs: 3.2 10*3/uL (ref 1.4–7.7)
Neutrophils Relative %: 58.4 % (ref 43.0–77.0)
Platelets: 218 10*3/uL (ref 150.0–400.0)
RBC: 4.38 Mil/uL (ref 3.87–5.11)
RDW: 14 % (ref 11.5–15.5)
WBC: 5.6 10*3/uL (ref 4.0–10.5)

## 2021-05-18 LAB — HEPATIC FUNCTION PANEL
ALT: 11 U/L (ref 0–35)
AST: 12 U/L (ref 0–37)
Albumin: 4.5 g/dL (ref 3.5–5.2)
Alkaline Phosphatase: 83 U/L (ref 39–117)
Bilirubin, Direct: 0.1 mg/dL (ref 0.0–0.3)
Total Bilirubin: 0.5 mg/dL (ref 0.2–1.2)
Total Protein: 7 g/dL (ref 6.0–8.3)

## 2021-05-18 LAB — IBC + FERRITIN
Ferritin: 102.8 ng/mL (ref 10.0–291.0)
Iron: 85 ug/dL (ref 42–145)
Saturation Ratios: 32.1 % (ref 20.0–50.0)
TIBC: 264.6 ug/dL (ref 250.0–450.0)
Transferrin: 189 mg/dL — ABNORMAL LOW (ref 212.0–360.0)

## 2021-05-18 LAB — LIPID PANEL
Cholesterol: 164 mg/dL (ref 0–200)
HDL: 52.1 mg/dL (ref >39.00)
LDL Cholesterol: 94 mg/dL (ref 0–99)
NonHDL: 111.85
Total CHOL/HDL Ratio: 3
Triglycerides: 88 mg/dL (ref 0.0–149.0)
VLDL: 17.6 mg/dL (ref 0.0–40.0)

## 2021-05-18 LAB — BASIC METABOLIC PANEL
BUN: 18 mg/dL (ref 6–23)
CO2: 30 mEq/L (ref 19–32)
Calcium: 9.3 mg/dL (ref 8.4–10.5)
Chloride: 102 mEq/L (ref 96–112)
Creatinine, Ser: 0.92 mg/dL (ref 0.40–1.20)
GFR: 68.46 mL/min (ref >60.00)
Glucose, Bld: 112 mg/dL — ABNORMAL HIGH (ref 70–99)
Potassium: 4.3 mEq/L (ref 3.5–5.1)
Sodium: 138 mEq/L (ref 135–145)

## 2021-05-18 LAB — HEMOGLOBIN A1C: Hgb A1c MFr Bld: 6.6 % — ABNORMAL HIGH (ref 4.6–6.5)

## 2021-05-19 ENCOUNTER — Encounter: Payer: Self-pay | Admitting: Internal Medicine

## 2021-05-19 NOTE — Telephone Encounter (Signed)
The iron and hgb levels are ok.  I can reevaluate her if she desires - if she is having persistent issues - or if she prefers, I can refer her to gyn.  ?

## 2021-05-21 ENCOUNTER — Encounter: Payer: Self-pay | Admitting: Internal Medicine

## 2021-05-21 NOTE — Assessment & Plan Note (Signed)
KOH/Wet prep obtained.  Nystatin cream to apply topically.  Await wet prep results.  Further treatment pending results.   ?

## 2021-05-21 NOTE — Assessment & Plan Note (Signed)
Low carb diet and exercise.  Follow met b and a1c.  Up to date with eye exam - 08/03/20 - ok.   ?Lab Results  ?Component Value Date  ? HGBA1C 6.6 (H) 05/18/2021  ? ?

## 2021-05-21 NOTE — Assessment & Plan Note (Signed)
Continue amlodipine.  Blood pressure doing well.  No changes in medication.  Follow pressures.  Follow metabolic panel.  

## 2021-05-24 ENCOUNTER — Other Ambulatory Visit: Payer: Self-pay

## 2021-05-24 DIAGNOSIS — N760 Acute vaginitis: Secondary | ICD-10-CM

## 2021-05-25 ENCOUNTER — Encounter: Payer: Self-pay | Admitting: Obstetrics

## 2021-05-25 ENCOUNTER — Ambulatory Visit (INDEPENDENT_AMBULATORY_CARE_PROVIDER_SITE_OTHER): Payer: No Typology Code available for payment source | Admitting: Obstetrics

## 2021-05-25 VITALS — BP 128/84 | Ht 59.0 in | Wt 173.0 lb

## 2021-05-25 DIAGNOSIS — N9089 Other specified noninflammatory disorders of vulva and perineum: Secondary | ICD-10-CM | POA: Diagnosis not present

## 2021-05-25 DIAGNOSIS — N3941 Urge incontinence: Secondary | ICD-10-CM

## 2021-05-25 LAB — POCT URINALYSIS DIPSTICK
Bilirubin, UA: NEGATIVE
Blood, UA: NEGATIVE
Glucose, UA: NEGATIVE
Ketones, UA: NEGATIVE
Leukocytes, UA: NEGATIVE
Nitrite, UA: NEGATIVE
Protein, UA: NEGATIVE
Spec Grav, UA: 1.015 (ref 1.010–1.025)
Urobilinogen, UA: NEGATIVE E.U./dL — AB
pH, UA: 6 (ref 5.0–8.0)

## 2021-05-25 MED ORDER — TRIAMCINOLONE ACETONIDE 0.5 % EX OINT: 1.0000 "application " | TOPICAL_OINTMENT | Freq: Two times a day (BID) | CUTANEOUS | 0 refills | Status: DC

## 2021-05-25 MED ORDER — FLUCONAZOLE 150 MG PO TABS: 150.0000 mg | ORAL_TABLET | Freq: Once | ORAL | 0 refills | Status: AC

## 2021-05-25 MED ORDER — OXYBUTYNIN CHLORIDE ER 5 MG PO TB24: 5.0000 mg | ORAL_TABLET | Freq: Every day | ORAL | 2 refills | Status: DC

## 2021-05-25 NOTE — Progress Notes (Addendum)
Chief Complaint  ?Patient presents with  ? Vaginitis  ? Dysuria  ?  Pt having some burning when she urinate X 2 weeks, no vaginal d/c or itching. Just "discomfort" . Vaginal ointments have not helped from Dr Nicki Reaper  ? ?HPI: ?Patient Courtney Donovan is an 61 y.o. year old G2P2  No LMP recorded. Patient has had a hysterectomy. who presents for presents for evaluation of vulvar pain. She recently saw PCP Einar Pheasant, MD and was treated with Nystatin and Cleocin. S/p cultures positive for BV only. Had been having external burning for about 2 weeks. Patient states the rx meds did not help much but recently began Vagisil and Vaseline which have improved the symptoms. No problems with intercourse. Has some vaginal dryness and uses replens. She is allergic to metrogel. Uses a ph balance wash. She was having external burning when urine hits the area. She is still feeling irritated. Feels sensitive. Patient denies any recent change in soap or detergent. Patient denies change in sexual partners. Patient denies history of pelvic infections. Patient denies domestic violence or sexual abuse. History of diabetes no meds - has not needed metformin. Patient does report leaking with urgency. Had a brief bit of diarrhea but after symptoms had already started.  ? ?Review of Systems  ?Constitutional:  Negative for activity change, appetite change, chills, diaphoresis, fatigue, fever and unexpected weight change.  ?HENT:  Negative for congestion, dental problem, drooling, ear discharge, ear pain, facial swelling, hearing loss, mouth sores, nosebleeds, postnasal drip, rhinorrhea, sinus pressure, sinus pain, sneezing, sore throat, tinnitus, trouble swallowing and voice change.   ?Eyes:  Negative for photophobia, pain, discharge, redness, itching and visual disturbance.  ?Respiratory:  Negative for apnea, cough, choking, chest tightness, shortness of breath, wheezing and stridor.   ?Cardiovascular:  Negative for chest pain,  palpitations and leg swelling.  ?Gastrointestinal:  Negative for abdominal distention, abdominal pain, anal bleeding, blood in stool, constipation, diarrhea, nausea, rectal pain and vomiting.  ?Endocrine: Negative for cold intolerance, heat intolerance, polydipsia, polyphagia and polyuria.  ?Genitourinary:  Positive for difficulty urinating and dyspareunia. Negative for decreased urine volume, dysuria, enuresis, flank pain, frequency, genital sores, hematuria, menstrual problem, pelvic pain, urgency, vaginal bleeding, vaginal discharge and vaginal pain.  ?Musculoskeletal:  Negative for arthralgias, back pain, gait problem, joint swelling, myalgias, neck pain and neck stiffness.  ?Skin:  Negative for color change, pallor, rash and wound.  ?Allergic/Immunologic: Negative for environmental allergies, food allergies and immunocompromised state.  ?Neurological:  Negative for dizziness, tremors, seizures, syncope, facial asymmetry, speech difficulty, weakness, light-headedness, numbness and headaches.  ?Hematological:  Negative for adenopathy. Does not bruise/bleed easily.  ?Psychiatric/Behavioral:  Negative for behavioral problems, confusion, decreased concentration, dysphoric mood, hallucinations, self-injury, sleep disturbance and suicidal ideas. The patient is not nervous/anxious and is not hyperactive.   ?  ?Past Medical History:  ?Diagnosis Date  ? Allergy   ? Anemia   ? Chronic headaches   ? Diabetes mellitus ([place])   ? diet controlled  ? Hypercholesterolemia   ? Hypertension   ? ?Past Surgical History:  ?Procedure Laterality Date  ? ABDOMINAL HYSTERECTOMY  2011  ? supracervical hysterectomy  ? TONSILECTOMY/ADENOIDECTOMY WITH MYRINGOTOMY  1067  ? TONSILLECTOMY    ? TUBAL LIGATION  1996  ? ?Family History  ?Problem Relation Age of Onset  ? Hypertension Mother   ? Cancer Father   ?     lung  ? Hypertension Father   ? Breast cancer Neg Hx   ? ?  Social History  ? ?Socioeconomic History  ? Marital status: Married  ?   Spouse name: Not on file  ? Number of children: 2  ? Years of education: Not on file  ? Highest education level: Not on file  ?Occupational History  ?  Employer: [employer]  ?Tobacco Use  ? Smoking status: Never  ? Smokeless tobacco: Never  ?Vaping Use  ? Vaping Use: Never used  ?Substance and Sexual Activity  ? Alcohol use: No  ?  Alcohol/week: 0.0 standard drinks  ? Drug use: No  ? Sexual activity: Yes  ?Other Topics Concern  ? Not on file  ?Social History Narrative  ? Not on file  ? ?Social Determinants of Health  ? ?Financial Resource Strain: Not on file  ?Food Insecurity: Not on file  ?Transportation Needs: Not on file  ?Physical Activity: Not on file  ?Stress: Not on file  ?Social Connections: Not on file  ?Intimate Partner Violence: Not on file  ? ?Medicine list and allergies reviewed and updated.   ? ?Objective:  ?BP 128/84   Ht '4\' 11"'$  (1.499 m)   Wt 173 lb (78.5 kg)   BMI 34.94 kg/m?  ?Physical Exam ?Vitals and nursing note reviewed. Exam conducted with a chaperone present.  ?Constitutional:   ?   Appearance: Normal appearance.  ?HENT:  ?   Head: Normocephalic and atraumatic.  ?Eyes:  ?   Extraocular Movements: Extraocular movements intact.  ?Pulmonary:  ?   Effort: Pulmonary effort is normal.  ?Abdominal:  ?   Comments: 4 cm Bruise at dexcom site on left  ?Genitourinary: ?   Exam position: Lithotomy position.  ?   Labia:     ?   Right: Tenderness and lesion present.     ?   Left: Tenderness and lesion present.   ?   Urethra: No prolapse, urethral pain or urethral swelling.  ?   Comments: Vulvar irritation noted with some erythema, nontender, no signs of cellulitis  ?Musculoskeletal:     ?   General: Normal range of motion.  ?   Cervical back: Normal range of motion.  ?Skin: ?   General: Skin is warm and dry.  ?Neurological:  ?   General: No focal deficit present.  ?   Mental Status: She is alert and oriented to person, place, and time.  ?Psychiatric:     ?   Mood and Affect: Mood normal.     ?   Behavior:  Behavior normal.     ?   Thought Content: Thought content normal.  ?  ?Assessment/Plan:  ? ?Vulvar irritation - Plan: triamcinolone ointment (KENALOG) 0.5 %, fluconazole (DIFLUCAN) 150 MG tablet, POCT Urinalysis Dipstick ? ?Urge incontinence - Plan: oxybutynin (DITROPAN-XL) 5 MG 24 hr tablet ? ?S/p recent treatment with nystatin and cleocin. Now using vagisil and vaseline. Recommend discontinue use of vagisil at this time and will rx triamcinolone daily and continued use of vaseline or aquaphor. Recommend using water only to clean area for now. Will do one time Diflucan as well given recent Cleocin.  ?Options for incontinence discussed with patient. Patient has been practicing Kegel's exercises. We discussed patient may benefit from medication for urgency. Discussed OTC Oxytrol. Patient will do trial of oxytrol. ? ?Return warnings for cellulitis if no improvement.  ? ?Return in about 2 weeks (around 06/08/2021). ? ?

## 2021-05-25 NOTE — Patient Instructions (Addendum)
Have a great year! Please call with any concerns. ?Don't forget to wear your seatbelt everyday! ?If you are not signed up on MyChart, please ask [place] how to sign up for it!  ? ?In a world where you can be anything, please be kind.  ? ?OXYTROL  ? ?Body mass index is 34.94 kg/m?Marland Kitchen  ?A Healthy Lifestyle: Care Instructions ?Your Care Instructions ? ?A healthy lifestyle can help you feel good, stay at a healthy weight, and have plenty of energy for both work and play. A healthy lifestyle is something you can share with your whole family. ?A healthy lifestyle also can lower your risk for serious health problems, such as high blood pressure, heart disease, and diabetes. ?You can follow a few steps listed below to improve your health and the health of your family. ?Follow-up care is a key part of your treatment and safety. Be sure to make and go to all appointments, and call your doctor if you are having problems. It's also a good idea to know your test results and keep a list of the medicines you take. ?How can you care for yourself at home? ?Do not eat too much sugar, fat, or fast foods. You can still have dessert and treats now and then. The goal is moderation. ?Start small to improve your eating habits. Pay attention to portion sizes, drink less juice and soda pop, and eat more fruits and vegetables. ?Eat a healthy amount of food. A 3-ounce serving of meat, for example, is about the size of a deck of cards. Fill the rest of your plate with vegetables and whole grains. ?Limit the amount of soda and sports drinks you have every day. Drink more water when you are thirsty. ?Eat at least 5 servings of fruits and vegetables every day. It may seem like a lot, but it is not hard to reach this goal. A serving or helping is 1 piece of fruit, 1 cup of vegetables, or 2 cups of leafy, raw vegetables. Have an apple or some carrot sticks as an afternoon snack instead of a candy bar. Try to have fruits and/or vegetables at every  meal. ?Make exercise part of your daily routine. You may want to start with simple activities, such as walking, bicycling, or slow swimming. Try to be active 30 to 60 minutes every day. You do not need to do all 30 to 60 minutes all at once. For example, you can exercise 3 times a day for 10 or 20 minutes. Moderate exercise is safe for most people, but it is always a good idea to talk to your doctor before starting an exercise program. ?Keep moving. Mow the lawn, work in the garden, or [employer]. Take the stairs instead of the elevator at work. ?If you smoke, quit. People who smoke have an increased risk for heart attack, stroke, cancer, and other lung illnesses. Quitting is hard, but there are ways to boost your chance of quitting tobacco for good. ?Use nicotine gum, patches, or lozenges. ?Ask your doctor about stop-smoking programs and medicines. ?Keep trying. ?In addition to reducing your risk of diseases in the future, you will notice some benefits soon after you stop using tobacco. If you have shortness of breath or asthma symptoms, they will likely get better within a few weeks after you quit. ?Limit how much alcohol you drink. Moderate amounts of alcohol (up to 2 drinks a day for men, 1 drink a day for women) are okay. But drinking too much  can lead to liver problems, high blood pressure, and other health problems. ?Family health ?If you have a family, there are many things you can do together to improve your health. ?Eat meals together as a family as often as possible. ?Eat healthy foods. This includes fruits, vegetables, lean meats and dairy, and whole grains. ?Include your family in your fitness plan. Most people think of activities such as jogging or tennis as the way to fitness, but there are many ways you and your family can be more active. Anything that makes you breathe hard and gets your heart pumping is exercise. Here are some tips: ?Walk to do errands or to take your child to school or the  bus. ?Go for a family bike ride after dinner instead of watching TV. ?Care instructions adapted under license by your healthcare professional. This care instruction is for use with your licensed healthcare professional. If you have questions about a medical condition or this instruction, always ask your healthcare professional. [place] any warranty or liability for your use of this information. ? ?

## 2021-06-08 ENCOUNTER — Ambulatory Visit: Payer: No Typology Code available for payment source | Admitting: Obstetrics

## 2021-07-15 ENCOUNTER — Telehealth: Payer: Self-pay

## 2021-07-15 ENCOUNTER — Encounter: Payer: Self-pay | Admitting: Internal Medicine

## 2021-07-15 ENCOUNTER — Ambulatory Visit (INDEPENDENT_AMBULATORY_CARE_PROVIDER_SITE_OTHER): Payer: No Typology Code available for payment source | Admitting: Internal Medicine

## 2021-07-15 DIAGNOSIS — N898 Other specified noninflammatory disorders of vagina: Secondary | ICD-10-CM

## 2021-07-15 DIAGNOSIS — E1165 Type 2 diabetes mellitus with hyperglycemia: Secondary | ICD-10-CM | POA: Diagnosis not present

## 2021-07-15 DIAGNOSIS — E7800 Pure hypercholesterolemia, unspecified: Secondary | ICD-10-CM

## 2021-07-15 DIAGNOSIS — I10 Essential (primary) hypertension: Secondary | ICD-10-CM

## 2021-07-15 DIAGNOSIS — K219 Gastro-esophageal reflux disease without esophagitis: Secondary | ICD-10-CM

## 2021-07-15 DIAGNOSIS — R519 Headache, unspecified: Secondary | ICD-10-CM | POA: Diagnosis not present

## 2021-07-15 DIAGNOSIS — E611 Iron deficiency: Secondary | ICD-10-CM

## 2021-07-15 MED ORDER — TIZANIDINE HCL 2 MG PO TABS: 2.0000 mg | ORAL_TABLET | Freq: Two times a day (BID) | ORAL | 0 refills | Status: DC | PRN

## 2021-07-15 MED ORDER — NURTEC 75 MG PO TBDP: 75.0000 mg | ORAL_TABLET | Freq: Every day | ORAL | 0 refills | Status: AC | PRN

## 2021-07-15 NOTE — Telephone Encounter (Signed)
Hold on any other medication at this time.  Just use the tylenol and tizanidine as directed - given headache better.

## 2021-07-15 NOTE — Progress Notes (Unsigned)
Patient ID: Courtney Donovan, female   DOB: March 28, 1960, 61 y.o.   MRN: [id number]   Subjective:    Patient ID: Courtney Donovan, female    DOB: 1961/01/08, 61 y.o.   MRN: [id number]  This visit occurred during the SARS-CoV-2 public health emergency.  Safety protocols were in place, including screening questions prior to the visit, additional usage of staff PPE, and extensive cleaning of exam room while observing appropriate contact time as indicated for disinfecting solutions.   Patient here for scheduled follow up.   Chief Complaint  Patient presents with   Headache   .   HPI Recently evaluated by gyn for vaginal irritation.  Prescribed TCC and recommended continued use of vaseline.    Past Medical History:  Diagnosis Date   Allergy    Anemia    Chronic headaches    Diabetes mellitus ([facility])    diet controlled   Hypercholesterolemia    Hypertension    Past Surgical History:  Procedure Laterality Date   ABDOMINAL HYSTERECTOMY  2011   supracervical hysterectomy   TONSILECTOMY/ADENOIDECTOMY WITH MYRINGOTOMY  67   TONSILLECTOMY     TUBAL LIGATION  1996   Family History  Problem Relation Age of Onset   Hypertension Mother    Cancer Father        lung   Hypertension Father    Breast cancer Neg Hx    Social History   Socioeconomic History   Marital status: Married    Spouse name: Not on file   Number of children: 2   Years of education: Not on file   Highest education level: Not on file  Occupational History    Employer: [employer]  Tobacco Use   Smoking status: Never   Smokeless tobacco: Never  Vaping Use   Vaping Use: Never used  Substance and Sexual Activity   Alcohol use: No    Alcohol/week: 0.0 standard drinks of alcohol   Drug use: No   Sexual activity: Yes  Other Topics Concern   Not on file  Social History Narrative   Not on file   Social Determinants of Health   Financial Resource Strain: Not on file  Food Insecurity: Not on  file  Transportation Needs: Not on file  Physical Activity: Not on file  Stress: Not on file  Social Connections: Not on file     Review of Systems     Objective:     BP 120/78 (BP Location: Left Arm, Patient Position: Sitting, Cuff Size: Small)   Pulse 70   Temp 98 F (36.7 C) (Temporal)   Resp (!) 70   Ht '4\' 11"'$  (1.499 m)   Wt 174 lb 6.4 oz (79.1 kg)   SpO2 98%   BMI 35.22 kg/m  Wt Readings from Last 3 Encounters:  07/15/21 174 lb 6.4 oz (79.1 kg)  05/25/21 173 lb (78.5 kg)  05/10/21 173 lb (78.5 kg)    Physical Exam   Outpatient Encounter Medications as of 07/15/2021  Medication Sig   amLODipine (NORVASC) 5 MG tablet TAKE 1 TABLET BY MOUTH  DAILY   aspirin 81 MG chewable tablet Chew 81 mg by mouth daily.   clindamycin (CLEOCIN) 2 % vaginal cream Place 1 Applicatorful vaginally at bedtime.   Continuous Blood Gluc Transmit (DEXCOM G6 TRANSMITTER) MISC Apply 1 Device topically every 14 (fourteen) days.   desonide (DESOWEN) 0.05 % cream Apply topically 2 (two) times daily as needed (Rash). For 2 weeks   fluticasone (  FLONASE) 50 MCG/ACT nasal spray Place 2 sprays into both nostrils daily.   LORazepam (ATIVAN) 0.5 MG tablet 1/2 tablet q day prn   metFORMIN (GLUCOPHAGE-XR) 500 MG 24 hr tablet TAKE 1 TABLET BY MOUTH  DAILY   Multiple Vitamin (MULTIVITAMIN) tablet Take 1 tablet by mouth daily.   nystatin cream (MYCOSTATIN) Apply 1 application. topically 2 (two) times daily.   omeprazole (PRILOSEC) 40 MG capsule Take 1 capsule (40 mg total) by mouth daily.   oxybutynin (DITROPAN-XL) 5 MG 24 hr tablet Take 1 tablet (5 mg total) by mouth at bedtime.   Probiotic Product (ALIGN) 4 MG CAPS Take one tablet q day   promethazine-dextromethorphan (PROMETHAZINE-DM) 6.25-15 MG/5ML syrup Take 5 ml po qhs for cough   Rimegepant Sulfate (NURTEC) 75 MG TBDP Take 75 mg by mouth daily as needed.   Rimegepant Sulfate 75 MG TBDP Take 1 tablet by mouth as needed.   rizatriptan (MAXALT) 10 MG  tablet Take 1 tablet by mouth as needed.   rosuvastatin (CRESTOR) 20 MG tablet Take 1 tablet (20 mg total) by mouth daily.   scopolamine (TRANSDERM-SCOP) 1 MG/3DAYS Place 1 patch (1.5 mg total) onto the skin every 3 (three) days.   sertraline (ZOLOFT) 50 MG tablet Take 1 tablet (50 mg total) by mouth daily.   sucralfate (CARAFATE) 1 g tablet TAKE 1 TABLET BY MOUTH THREE TIMES DAILY BEFORE MEAL(S)   tiZANidine (ZANAFLEX) 2 MG tablet Take 1 tablet (2 mg total) by mouth 2 (two) times daily as needed for muscle spasms.   triamcinolone ointment (KENALOG) 0.5 % Apply 1 application. topically 2 (two) times daily.   No facility-administered encounter medications on file as of 07/15/2021.     Lab Results  Component Value Date   WBC 5.6 05/18/2021   HGB 12.7 05/18/2021   HCT 38.7 05/18/2021   PLT 218.0 05/18/2021   GLUCOSE 112 (H) 05/18/2021   CHOL 164 05/18/2021   TRIG 88.0 05/18/2021   HDL 52.10 05/18/2021   LDLDIRECT 159.5 01/16/2013   LDLCALC 94 05/18/2021   ALT 11 05/18/2021   AST 12 05/18/2021   NA 138 05/18/2021   K 4.3 05/18/2021   CL 102 05/18/2021   CREATININE 0.92 05/18/2021   BUN 18 05/18/2021   CO2 30 05/18/2021   TSH 2.22 09/21/2020   HGBA1C 6.6 (H) 05/18/2021   MICROALBUR <0.7 09/21/2020       Assessment & Plan:   Problem List Items Addressed This Visit   None    Einar Pheasant, MD

## 2021-07-15 NOTE — Telephone Encounter (Signed)
Pt called and messaged was relayed. Pt verbalized understanding.

## 2021-07-16 ENCOUNTER — Encounter: Payer: Self-pay | Admitting: Internal Medicine

## 2021-07-16 NOTE — Assessment & Plan Note (Signed)
Continue crestor.  Low cholesterol diet and exercise.  Follow lipid panel and liver function tests.   Lab Results  Component Value Date   CHOL 164 05/18/2021   HDL 52.10 05/18/2021   LDLCALC 94 05/18/2021   LDLDIRECT 159.5 01/16/2013   TRIG 88.0 05/18/2021   CHOLHDL 3 05/18/2021

## 2021-07-16 NOTE — Assessment & Plan Note (Signed)
Low carb diet and exercise.  Follow met b and a1c.  eye exam - 08/03/20 - ok.   Lab Results  Component Value Date   HGBA1C 6.6 (H) 05/18/2021

## 2021-07-16 NOTE — Assessment & Plan Note (Signed)
Saw GI.  Hemoccult cards negative.  Follow cbc and iron studies.

## 2021-07-16 NOTE — Assessment & Plan Note (Signed)
Has been seeing neurology.  Diagnosed with migraine headaches and occipital neuralgia.  Discussed current headache.  Pain - posterior head - similar to pain from occipital neuralgia.  Also having throbbing headache.  Is some better today.  Question if triggered by motion sickness and heat as outlined.  Discussed concern regarding persistent throbbing headache.  Discussed ER evaluation, MRI and CT scan.  She is feeling some better now.  Wants to go home and rest and see if symptoms continue to improve.  Note given to remain out of work.  Refill tizanidine.  Rest.  Fluids.  Call with update this pm.  If increased pain,change in symptoms, etc - will need to be reevaluated/scanned, etc.

## 2021-07-16 NOTE — Assessment & Plan Note (Signed)
On prilosec. Continue.  Some acid reflux with current symptoms.  Follow.

## 2021-07-16 NOTE — Assessment & Plan Note (Signed)
Continue amlodipine.  Blood pressure doing well.  No changes in medication.  Follow pressures.  Follow metabolic panel.  

## 2021-07-16 NOTE — Assessment & Plan Note (Signed)
Saw gyn.  Prescribed TCC. Doing better.  Follow.

## 2021-09-07 ENCOUNTER — Other Ambulatory Visit (INDEPENDENT_AMBULATORY_CARE_PROVIDER_SITE_OTHER): Payer: No Typology Code available for payment source

## 2021-09-07 DIAGNOSIS — E1165 Type 2 diabetes mellitus with hyperglycemia: Secondary | ICD-10-CM | POA: Diagnosis not present

## 2021-09-07 DIAGNOSIS — E611 Iron deficiency: Secondary | ICD-10-CM | POA: Diagnosis not present

## 2021-09-07 DIAGNOSIS — I10 Essential (primary) hypertension: Secondary | ICD-10-CM | POA: Diagnosis not present

## 2021-09-07 DIAGNOSIS — E7800 Pure hypercholesterolemia, unspecified: Secondary | ICD-10-CM

## 2021-09-07 LAB — LIPID PANEL
Cholesterol: 161 mg/dL (ref 0–200)
HDL: 48.3 mg/dL (ref >39.00)
LDL Cholesterol: 94 mg/dL (ref 0–99)
NonHDL: 113
Total CHOL/HDL Ratio: 3
Triglycerides: 97 mg/dL (ref 0.0–149.0)
VLDL: 19.4 mg/dL (ref 0.0–40.0)

## 2021-09-07 LAB — HEPATIC FUNCTION PANEL
ALT: 11 U/L (ref 0–35)
AST: 14 U/L (ref 0–37)
Albumin: 4.2 g/dL (ref 3.5–5.2)
Alkaline Phosphatase: 78 U/L (ref 39–117)
Bilirubin, Direct: 0.1 mg/dL (ref 0.0–0.3)
Total Bilirubin: 0.4 mg/dL (ref 0.2–1.2)
Total Protein: 7 g/dL (ref 6.0–8.3)

## 2021-09-07 LAB — BASIC METABOLIC PANEL
BUN: 12 mg/dL (ref 6–23)
CO2: 28 mEq/L (ref 19–32)
Calcium: 9.2 mg/dL (ref 8.4–10.5)
Chloride: 105 mEq/L (ref 96–112)
Creatinine, Ser: 0.84 mg/dL (ref 0.40–1.20)
GFR: 76.19 mL/min (ref >60.00)
Glucose, Bld: 119 mg/dL — ABNORMAL HIGH (ref 70–99)
Potassium: 3.6 mEq/L (ref 3.5–5.1)
Sodium: 140 mEq/L (ref 135–145)

## 2021-09-07 LAB — CBC WITH DIFFERENTIAL/PLATELET
Basophils Absolute: 0 10*3/uL (ref 0.0–0.1)
Basophils Relative: 0.2 % (ref 0.0–3.0)
Eosinophils Absolute: 0.1 10*3/uL (ref 0.0–0.7)
Eosinophils Relative: 1.2 % (ref 0.0–5.0)
HCT: 37.2 % (ref 36.0–46.0)
Hemoglobin: 12.4 g/dL (ref 12.0–15.0)
Lymphocytes Relative: 37.9 % (ref 12.0–46.0)
Lymphs Abs: 2.2 10*3/uL (ref 0.7–4.0)
MCHC: 33.3 g/dL (ref 30.0–36.0)
MCV: 87.3 fl (ref 78.0–100.0)
Monocytes Absolute: 0.4 10*3/uL (ref 0.1–1.0)
Monocytes Relative: 6.6 % (ref 3.0–12.0)
Neutro Abs: 3.2 10*3/uL (ref 1.4–7.7)
Neutrophils Relative %: 54.1 % (ref 43.0–77.0)
Platelets: 202 10*3/uL (ref 150.0–400.0)
RBC: 4.26 Mil/uL (ref 3.87–5.11)
RDW: 13.4 % (ref 11.5–15.5)
WBC: 5.9 10*3/uL (ref 4.0–10.5)

## 2021-09-07 LAB — IBC + FERRITIN
Ferritin: 79.2 ng/mL (ref 10.0–291.0)
Iron: 66 ug/dL (ref 42–145)
Saturation Ratios: 24.9 % (ref 20.0–50.0)
TIBC: 264.6 ug/dL (ref 250.0–450.0)
Transferrin: 189 mg/dL — ABNORMAL LOW (ref 212.0–360.0)

## 2021-09-07 LAB — HEMOGLOBIN A1C: Hgb A1c MFr Bld: 7 % — ABNORMAL HIGH (ref 4.6–6.5)

## 2021-09-07 LAB — TSH: TSH: 2.76 u[IU]/mL (ref 0.35–5.50)

## 2021-09-09 ENCOUNTER — Ambulatory Visit (INDEPENDENT_AMBULATORY_CARE_PROVIDER_SITE_OTHER): Payer: No Typology Code available for payment source | Admitting: Internal Medicine

## 2021-09-09 ENCOUNTER — Encounter: Payer: Self-pay | Admitting: Internal Medicine

## 2021-09-09 DIAGNOSIS — E7800 Pure hypercholesterolemia, unspecified: Secondary | ICD-10-CM

## 2021-09-09 DIAGNOSIS — R5383 Other fatigue: Secondary | ICD-10-CM

## 2021-09-09 DIAGNOSIS — E1165 Type 2 diabetes mellitus with hyperglycemia: Secondary | ICD-10-CM

## 2021-09-09 DIAGNOSIS — K219 Gastro-esophageal reflux disease without esophagitis: Secondary | ICD-10-CM | POA: Diagnosis not present

## 2021-09-09 DIAGNOSIS — E559 Vitamin D deficiency, unspecified: Secondary | ICD-10-CM

## 2021-09-09 DIAGNOSIS — I10 Essential (primary) hypertension: Secondary | ICD-10-CM | POA: Diagnosis not present

## 2021-09-09 DIAGNOSIS — R519 Headache, unspecified: Secondary | ICD-10-CM

## 2021-09-09 DIAGNOSIS — F439 Reaction to severe stress, unspecified: Secondary | ICD-10-CM

## 2021-09-09 MED ORDER — MAGNESIUM OXIDE -MG SUPPLEMENT 400 (240 MG) MG PO TABS: 400.0000 mg | ORAL_TABLET | Freq: Every day | ORAL | 2 refills | Status: AC

## 2021-09-09 NOTE — Progress Notes (Unsigned)
Patient ID: Courtney Donovan, female   DOB: 23-Jun-1960, 61 y.o.   MRN: [id number]   Subjective:    Patient ID: Courtney Donovan, female    DOB: 1960-04-05, 61 y.o.   MRN: [id number]  Patient here for  Chief Complaint  Patient presents with   Follow-up    8 week   .   HPI Here to follow up regarding her blood pressure, cholesterol and blood sugar. States she is doing relatively well.  Does report some fatigue.  Also still having issues with intermittent headaches.  Up to date with eye exams.  Discussed sleep apnea.  Agreeable for referral.  Also discussed trial of magnesium.  No chest pain or sob reported.  No abdominal pain.  Bowels moving.     Past Medical History:  Diagnosis Date   Allergy    Anemia    Chronic headaches    Diabetes mellitus (HCC)    diet controlled   Hypercholesterolemia    Hypertension    Past Surgical History:  Procedure Laterality Date   ABDOMINAL HYSTERECTOMY  2011   supracervical hysterectomy   TONSILECTOMY/ADENOIDECTOMY WITH MYRINGOTOMY  42   TONSILLECTOMY     TUBAL LIGATION  1996   Family History  Problem Relation Age of Onset   Hypertension Mother    Cancer Father        lung   Hypertension Father    Breast cancer Neg Hx    Social History   Socioeconomic History   Marital status: Married    Spouse name: Not on file   Number of children: 2   Years of education: Not on file   Highest education level: Not on file  Occupational History    Employer: [employer]  Tobacco Use   Smoking status: Never   Smokeless tobacco: Never  Vaping Use   Vaping Use: Never used  Substance and Sexual Activity   Alcohol use: No    Alcohol/week: 0.0 standard drinks of alcohol   Drug use: No   Sexual activity: Yes  Other Topics Concern   Not on file  Social History Narrative   Not on file   Social Determinants of Health   Financial Resource Strain: Not on file  Food Insecurity: Not on file  Transportation Needs: Not on file   Physical Activity: Not on file  Stress: Not on file  Social Connections: Not on file     Review of Systems  Constitutional:  Negative for appetite change and unexpected weight change.  HENT:  Negative for congestion and sinus pressure.   Respiratory:  Negative for cough, chest tightness and shortness of breath.   Cardiovascular:  Negative for chest pain, palpitations and leg swelling.  Gastrointestinal:  Negative for abdominal pain, diarrhea, nausea and vomiting.  Genitourinary:  Negative for difficulty urinating and dysuria.  Musculoskeletal:  Negative for joint swelling and myalgias.  Skin:  Negative for color change and rash.  Neurological:  Positive for headaches. Negative for dizziness.  Psychiatric/Behavioral:  Negative for agitation and dysphoric mood.        Objective:     BP 118/72   Pulse 81   Temp 98.3 F (36.8 C) (Oral)   Ht '4\' 11"'  (1.499 m)   Wt 175 lb 12.8 oz (79.7 kg)   SpO2 97%   BMI 35.51 kg/m  Wt Readings from Last 3 Encounters:  09/09/21 175 lb 12.8 oz (79.7 kg)  07/15/21 174 lb 6.4 oz (79.1 kg)  05/25/21 173 lb (78.5  kg)    Physical Exam Vitals reviewed.  Constitutional:      General: She is not in acute distress.    Appearance: Normal appearance.  HENT:     Head: Normocephalic and atraumatic.     Right Ear: External ear normal.     Left Ear: External ear normal.  Eyes:     General: No scleral icterus.       Right eye: No discharge.        Left eye: No discharge.     Conjunctiva/sclera: Conjunctivae normal.  Neck:     Thyroid: No thyromegaly.  Cardiovascular:     Rate and Rhythm: Normal rate and regular rhythm.  Pulmonary:     Effort: No respiratory distress.     Breath sounds: Normal breath sounds. No wheezing.  Abdominal:     General: Bowel sounds are normal.     Palpations: Abdomen is soft.     Tenderness: There is no abdominal tenderness.  Musculoskeletal:        General: No swelling or tenderness.     Cervical back: Neck  supple. No tenderness.  Lymphadenopathy:     Cervical: No cervical adenopathy.  Skin:    Findings: No erythema or rash.  Neurological:     Mental Status: She is alert.  Psychiatric:        Mood and Affect: Mood normal.        Behavior: Behavior normal.      Outpatient Encounter Medications as of 09/09/2021  Medication Sig   amLODipine (NORVASC) 5 MG tablet TAKE 1 TABLET BY MOUTH  DAILY   aspirin 81 MG chewable tablet Chew 81 mg by mouth daily.   clindamycin (CLEOCIN) 2 % vaginal cream Place 1 Applicatorful vaginally at bedtime.   Continuous Blood Gluc Transmit (DEXCOM G6 TRANSMITTER) MISC Apply 1 Device topically every 14 (fourteen) days.   desonide (DESOWEN) 0.05 % cream Apply topically 2 (two) times daily as needed (Rash). For 2 weeks   fluticasone (FLONASE) 50 MCG/ACT nasal spray Place 2 sprays into both nostrils daily.   LORazepam (ATIVAN) 0.5 MG tablet 1/2 tablet q day prn   magnesium oxide (MAG-OX) 400 (240 Mg) MG tablet Take 1 tablet (400 mg total) by mouth daily.   metFORMIN (GLUCOPHAGE-XR) 500 MG 24 hr tablet TAKE 1 TABLET BY MOUTH  DAILY   Multiple Vitamin (MULTIVITAMIN) tablet Take 1 tablet by mouth daily.   nystatin cream (MYCOSTATIN) Apply 1 application. topically 2 (two) times daily.   omeprazole (PRILOSEC) 40 MG capsule Take 1 capsule (40 mg total) by mouth daily.   oxybutynin (DITROPAN-XL) 5 MG 24 hr tablet Take 1 tablet (5 mg total) by mouth at bedtime.   Probiotic Product (ALIGN) 4 MG CAPS Take one tablet q day   promethazine-dextromethorphan (PROMETHAZINE-DM) 6.25-15 MG/5ML syrup Take 5 ml po qhs for cough   Rimegepant Sulfate (NURTEC) 75 MG TBDP Take 75 mg by mouth daily as needed.   Rimegepant Sulfate 75 MG TBDP Take 1 tablet by mouth as needed.   rizatriptan (MAXALT) 10 MG tablet Take 1 tablet by mouth as needed.   rosuvastatin (CRESTOR) 20 MG tablet Take 1 tablet (20 mg total) by mouth daily.   scopolamine (TRANSDERM-SCOP) 1 MG/3DAYS Place 1 patch (1.5 mg  total) onto the skin every 3 (three) days.   sertraline (ZOLOFT) 50 MG tablet Take 1 tablet (50 mg total) by mouth daily.   sucralfate (CARAFATE) 1 g tablet TAKE 1 TABLET BY MOUTH THREE TIMES DAILY BEFORE  MEAL(S)   tiZANidine (ZANAFLEX) 2 MG tablet Take 1 tablet (2 mg total) by mouth 2 (two) times daily as needed for muscle spasms.   triamcinolone ointment (KENALOG) 0.5 % Apply 1 application. topically 2 (two) times daily.   No facility-administered encounter medications on file as of 09/09/2021.     Lab Results  Component Value Date   WBC 5.9 09/07/2021   HGB 12.4 09/07/2021   HCT 37.2 09/07/2021   PLT 202.0 09/07/2021   GLUCOSE 119 (H) 09/07/2021   CHOL 161 09/07/2021   TRIG 97.0 09/07/2021   HDL 48.30 09/07/2021   LDLDIRECT 159.5 01/16/2013   LDLCALC 94 09/07/2021   ALT 11 09/07/2021   AST 14 09/07/2021   NA 140 09/07/2021   K 3.6 09/07/2021   CL 105 09/07/2021   CREATININE 0.84 09/07/2021   BUN 12 09/07/2021   CO2 28 09/07/2021   TSH 2.76 09/07/2021   HGBA1C 7.0 (H) 09/07/2021   MICROALBUR <0.7 09/21/2020       Assessment & Plan:   Problem List Items Addressed This Visit     Diabetes mellitus ([place])    Low carb diet and exercise.  Recent a1c elevated.  Discussed taking metformin. Will start taking daily.  Follow met b and a1c.    Lab Results  Component Value Date   HGBA1C 7.0 (H) 09/07/2021       Fatigue    Fatigue.  Headaches.  Discussed possible sleep apnea.  Refer to pulmonary.       Relevant Orders   Ambulatory referral to Pulmonology   GERD (gastroesophageal reflux disease)     On prilosec. Continue.         Headache    Has been seeing neurology.  Diagnosed with migraine headaches and occipital neuralgia.  Having persistent intermittent headaches.  Start mag oxide as directed.  Given fatigue and headache discussed possible sleep apnea.  Refer to pulmonary for further evaluation.       Relevant Orders   Ambulatory referral to Pulmonology    Hypercholesterolemia    Continue crestor.  Low cholesterol diet and exercise.  Follow lipid panel and liver function tests.   Lab Results  Component Value Date   CHOL 161 09/07/2021   HDL 48.30 09/07/2021   LDLCALC 94 09/07/2021   LDLDIRECT 159.5 01/16/2013   TRIG 97.0 09/07/2021   CHOLHDL 3 09/07/2021       Hypertension    Continue amlodipine.  Blood pressure doing well.  No changes in medication.  Follow pressures.  Follow metabolic panel.       Stress    Continue zoloft. Overall stable.  Follow.       Vitamin D deficiency    Continue vitamin D supplements.  Check vitamin D level with next labs.         Einar Pheasant, MD

## 2021-09-09 NOTE — Patient Instructions (Signed)
Mag oxide - one per day  Vitamin D3 1000 units per day

## 2021-09-10 ENCOUNTER — Encounter: Payer: Self-pay | Admitting: Internal Medicine

## 2021-09-10 NOTE — Assessment & Plan Note (Signed)
Has been seeing neurology.  Diagnosed with migraine headaches and occipital neuralgia.  Having persistent intermittent headaches.  Start mag oxide as directed.  Given fatigue and headache discussed possible sleep apnea.  Refer to pulmonary for further evaluation.

## 2021-09-10 NOTE — Assessment & Plan Note (Signed)
Continue vitamin D supplements.  Check vitamin D level with next labs.

## 2021-09-10 NOTE — Assessment & Plan Note (Signed)
Low carb diet and exercise.  Recent a1c elevated.  Discussed taking metformin. Will start taking daily.  Follow met b and a1c.    Lab Results  Component Value Date   HGBA1C 7.0 (H) 09/07/2021

## 2021-09-10 NOTE — Assessment & Plan Note (Signed)
Continue crestor.  Low cholesterol diet and exercise.  Follow lipid panel and liver function tests.   Lab Results  Component Value Date   CHOL 161 09/07/2021   HDL 48.30 09/07/2021   LDLCALC 94 09/07/2021   LDLDIRECT 159.5 01/16/2013   TRIG 97.0 09/07/2021   CHOLHDL 3 09/07/2021   

## 2021-09-10 NOTE — Assessment & Plan Note (Signed)
Continue amlodipine.  Blood pressure doing well.  No changes in medication.  Follow pressures.  Follow metabolic panel.  

## 2021-09-10 NOTE — Assessment & Plan Note (Signed)
Fatigue.  Headaches.  Discussed possible sleep apnea.  Refer to pulmonary.

## 2021-09-10 NOTE — Assessment & Plan Note (Signed)
On prilosec.  Continue.   

## 2021-09-10 NOTE — Assessment & Plan Note (Signed)
Continue zoloft.  Overall stable. Follow.  

## 2021-11-04 ENCOUNTER — Encounter: Payer: Self-pay | Admitting: Internal Medicine

## 2021-11-04 ENCOUNTER — Ambulatory Visit (INDEPENDENT_AMBULATORY_CARE_PROVIDER_SITE_OTHER): Payer: No Typology Code available for payment source | Admitting: Internal Medicine

## 2021-11-04 ENCOUNTER — Other Ambulatory Visit: Payer: Self-pay | Admitting: *Deleted

## 2021-11-04 VITALS — BP 110/70 | HR 78 | Temp 97.9°F | Resp 17 | Ht 59.0 in | Wt 176.5 lb

## 2021-11-04 DIAGNOSIS — E611 Iron deficiency: Secondary | ICD-10-CM

## 2021-11-04 DIAGNOSIS — F439 Reaction to severe stress, unspecified: Secondary | ICD-10-CM

## 2021-11-04 DIAGNOSIS — E1165 Type 2 diabetes mellitus with hyperglycemia: Secondary | ICD-10-CM | POA: Diagnosis not present

## 2021-11-04 DIAGNOSIS — I10 Essential (primary) hypertension: Secondary | ICD-10-CM

## 2021-11-04 DIAGNOSIS — K219 Gastro-esophageal reflux disease without esophagitis: Secondary | ICD-10-CM

## 2021-11-04 DIAGNOSIS — R197 Diarrhea, unspecified: Secondary | ICD-10-CM

## 2021-11-04 DIAGNOSIS — Z0000 Encounter for general adult medical examination without abnormal findings: Secondary | ICD-10-CM

## 2021-11-04 DIAGNOSIS — E559 Vitamin D deficiency, unspecified: Secondary | ICD-10-CM

## 2021-11-04 DIAGNOSIS — E7800 Pure hypercholesterolemia, unspecified: Secondary | ICD-10-CM

## 2021-11-04 LAB — MICROALBUMIN / CREATININE URINE RATIO
Creatinine,U: 179.4 mg/dL
Microalb Creat Ratio: 0.4 mg/g (ref 0.0–30.0)
Microalb, Ur: <0.7 mg/dL (ref 0.0–1.9)

## 2021-11-04 LAB — HM DIABETES FOOT EXAM

## 2021-11-04 MED ORDER — METFORMIN HCL ER 500 MG PO TB24: 500.0000 mg | ORAL_TABLET | Freq: Every day | ORAL | 1 refills | Status: DC

## 2021-11-04 MED ORDER — AMLODIPINE BESYLATE 5 MG PO TABS: 5.0000 mg | ORAL_TABLET | Freq: Every day | ORAL | 1 refills | Status: DC

## 2021-11-04 NOTE — Patient Instructions (Signed)
Magnesium glycinate

## 2021-11-04 NOTE — Progress Notes (Signed)
Patient ID: Courtney Donovan, female   DOB: 09-10-1960, 61 y.o.   MRN: [id number]   Subjective:    Patient ID: Courtney Donovan, female    DOB: [birth year], 61 y.o.   MRN: [id number]   Patient here for No chief complaint on file.  Marland Kitchen   HPI Here to follow up regarding her blood pressure, cholesterol and blood sugar. States she is doing relatively well. Previous headaches.  Started on magnesium.  Has seen neurology - migraine headaches and occipital neuralgia.    Past Medical History:  Diagnosis Date   Allergy    Anemia    Chronic headaches    Diabetes mellitus (HCC)    diet controlled   Hypercholesterolemia    Hypertension    Past Surgical History:  Procedure Laterality Date   ABDOMINAL HYSTERECTOMY  2011   supracervical hysterectomy   TONSILECTOMY/ADENOIDECTOMY WITH MYRINGOTOMY  89   TONSILLECTOMY     TUBAL LIGATION  1996   Family History  Problem Relation Age of Onset   Hypertension Mother    Cancer Father        lung   Hypertension Father    Breast cancer Neg Hx    Social History   Socioeconomic History   Marital status: Married    Spouse name: Not on file   Number of children: 2   Years of education: Not on file   Highest education level: Not on file  Occupational History    Employer: [employer]  Tobacco Use   Smoking status: Never   Smokeless tobacco: Never  Vaping Use   Vaping Use: Never used  Substance and Sexual Activity   Alcohol use: No    Alcohol/week: 0.0 standard drinks of alcohol   Drug use: No   Sexual activity: Yes  Other Topics Concern   Not on file  Social History Narrative   Not on file   Social Determinants of Health   Financial Resource Strain: Not on file  Food Insecurity: Not on file  Transportation Needs: Not on file  Physical Activity: Not on file  Stress: Not on file  Social Connections: Not on file     Review of Systems     Objective:     There were no vitals taken for this visit. Wt Readings  from Last 3 Encounters:  09/09/21 175 lb 12.8 oz (79.7 kg)  07/15/21 174 lb 6.4 oz (79.1 kg)  05/25/21 173 lb (78.5 kg)    Physical Exam   Outpatient Encounter Medications as of 11/04/2021  Medication Sig   amLODipine (NORVASC) 5 MG tablet TAKE 1 TABLET BY MOUTH  DAILY   aspirin 81 MG chewable tablet Chew 81 mg by mouth daily.   clindamycin (CLEOCIN) 2 % vaginal cream Place 1 Applicatorful vaginally at bedtime.   Continuous Blood Gluc Transmit (DEXCOM G6 TRANSMITTER) MISC Apply 1 Device topically every 14 (fourteen) days.   desonide (DESOWEN) 0.05 % cream Apply topically 2 (two) times daily as needed (Rash). For 2 weeks   fluticasone (FLONASE) 50 MCG/ACT nasal spray Place 2 sprays into both nostrils daily.   LORazepam (ATIVAN) 0.5 MG tablet 1/2 tablet q day prn   magnesium oxide (MAG-OX) 400 (240 Mg) MG tablet Take 1 tablet (400 mg total) by mouth daily.   metFORMIN (GLUCOPHAGE-XR) 500 MG 24 hr tablet TAKE 1 TABLET BY MOUTH  DAILY   Multiple Vitamin (MULTIVITAMIN) tablet Take 1 tablet by mouth daily.   nystatin cream (MYCOSTATIN) Apply 1 application. topically 2 (  two) times daily.   omeprazole (PRILOSEC) 40 MG capsule Take 1 capsule (40 mg total) by mouth daily.   oxybutynin (DITROPAN-XL) 5 MG 24 hr tablet Take 1 tablet (5 mg total) by mouth at bedtime.   Probiotic Product (ALIGN) 4 MG CAPS Take one tablet q day   promethazine-dextromethorphan (PROMETHAZINE-DM) 6.25-15 MG/5ML syrup Take 5 ml po qhs for cough   Rimegepant Sulfate (NURTEC) 75 MG TBDP Take 75 mg by mouth daily as needed.   Rimegepant Sulfate 75 MG TBDP Take 1 tablet by mouth as needed.   rizatriptan (MAXALT) 10 MG tablet Take 1 tablet by mouth as needed.   rosuvastatin (CRESTOR) 20 MG tablet Take 1 tablet (20 mg total) by mouth daily.   scopolamine (TRANSDERM-SCOP) 1 MG/3DAYS Place 1 patch (1.5 mg total) onto the skin every 3 (three) days.   sertraline (ZOLOFT) 50 MG tablet Take 1 tablet (50 mg total) by mouth daily.    sucralfate (CARAFATE) 1 g tablet TAKE 1 TABLET BY MOUTH THREE TIMES DAILY BEFORE MEAL(S)   tiZANidine (ZANAFLEX) 2 MG tablet Take 1 tablet (2 mg total) by mouth 2 (two) times daily as needed for muscle spasms.   triamcinolone ointment (KENALOG) 0.5 % Apply 1 application. topically 2 (two) times daily.   No facility-administered encounter medications on file as of 11/04/2021.     Lab Results  Component Value Date   WBC 5.9 09/07/2021   HGB 12.4 09/07/2021   HCT 37.2 09/07/2021   PLT 202.0 09/07/2021   GLUCOSE 119 (H) 09/07/2021   CHOL 161 09/07/2021   TRIG 97.0 09/07/2021   HDL 48.30 09/07/2021   LDLDIRECT 159.5 01/16/2013   LDLCALC 94 09/07/2021   ALT 11 09/07/2021   AST 14 09/07/2021   NA 140 09/07/2021   K 3.6 09/07/2021   CL 105 09/07/2021   CREATININE 0.84 09/07/2021   BUN 12 09/07/2021   CO2 28 09/07/2021   TSH 2.76 09/07/2021   HGBA1C 7.0 (H) 09/07/2021   MICROALBUR <0.7 09/21/2020       Assessment & Plan:   Problem List Items Addressed This Visit   None    Einar Pheasant, MD

## 2021-11-05 ENCOUNTER — Encounter: Payer: Self-pay | Admitting: Internal Medicine

## 2021-11-05 NOTE — Assessment & Plan Note (Signed)
Loose stool last night.  States has not been a persistent issue.  Follow.

## 2021-11-05 NOTE — Assessment & Plan Note (Signed)
Saw GI previously.  Hemoccult cards negative.  Follow hgb and iron studies.

## 2021-11-05 NOTE — Assessment & Plan Note (Signed)
Continue amlodipine.  Blood pressure doing well.  No changes in medication.  Follow pressures.  Follow metabolic panel.

## 2021-11-05 NOTE — Assessment & Plan Note (Signed)
Some acid reflux.  Discussed taking prilosec on a regular basis.  Hold on carafate. Prilosec daily.  Follow.  Notify me if acid reflux is persistent.

## 2021-11-05 NOTE — Assessment & Plan Note (Signed)
Check vitamin D level with next labs.  ?

## 2021-11-05 NOTE — Assessment & Plan Note (Signed)
Continue crestor.  Low cholesterol diet and exercise.  Follow lipid panel and liver function tests.   Lab Results  Component Value Date   CHOL 161 09/07/2021   HDL 48.30 09/07/2021   LDLCALC 94 09/07/2021   LDLDIRECT 159.5 01/16/2013   TRIG 97.0 09/07/2021   CHOLHDL 3 09/07/2021

## 2021-11-05 NOTE — Assessment & Plan Note (Signed)
Low carb diet and exercise.  Recent a1c elevated.  On metformin.  Follow met b and a1c.    Lab Results  Component Value Date   HGBA1C 7.0 (H) 09/07/2021

## 2021-11-05 NOTE — Assessment & Plan Note (Signed)
Continue zoloft.  Appears to be handling things well.  Follow.

## 2021-12-05 ENCOUNTER — Other Ambulatory Visit (INDEPENDENT_AMBULATORY_CARE_PROVIDER_SITE_OTHER): Payer: No Typology Code available for payment source

## 2021-12-05 ENCOUNTER — Encounter: Payer: Self-pay | Admitting: Internal Medicine

## 2021-12-05 ENCOUNTER — Other Ambulatory Visit: Payer: Self-pay

## 2021-12-05 ENCOUNTER — Telehealth (INDEPENDENT_AMBULATORY_CARE_PROVIDER_SITE_OTHER): Payer: No Typology Code available for payment source | Admitting: Internal Medicine

## 2021-12-05 ENCOUNTER — Other Ambulatory Visit (INDEPENDENT_AMBULATORY_CARE_PROVIDER_SITE_OTHER): Payer: No Typology Code available for payment source | Admitting: Internal Medicine

## 2021-12-05 DIAGNOSIS — R300 Dysuria: Secondary | ICD-10-CM

## 2021-12-05 DIAGNOSIS — E1165 Type 2 diabetes mellitus with hyperglycemia: Secondary | ICD-10-CM | POA: Diagnosis not present

## 2021-12-05 DIAGNOSIS — I10 Essential (primary) hypertension: Secondary | ICD-10-CM | POA: Diagnosis not present

## 2021-12-05 DIAGNOSIS — R3915 Urgency of urination: Secondary | ICD-10-CM

## 2021-12-05 DIAGNOSIS — N3001 Acute cystitis with hematuria: Secondary | ICD-10-CM

## 2021-12-05 DIAGNOSIS — N390 Urinary tract infection, site not specified: Secondary | ICD-10-CM | POA: Insufficient documentation

## 2021-12-05 LAB — POCT URINALYSIS DIPSTICK (MANUAL)
Nitrite, UA: NEGATIVE
Poct Bilirubin: NEGATIVE
Poct Blood: 50 — AB
Poct Glucose: NORMAL mg/dL
Poct Ketones: NEGATIVE
Poct Urobilinogen: NORMAL mg/dL
Spec Grav, UA: 1.02 (ref 1.010–1.025)
pH, UA: 6 (ref 5.0–8.0)

## 2021-12-05 LAB — URINALYSIS, MICROSCOPIC ONLY

## 2021-12-05 MED ORDER — CEFDINIR 300 MG PO CAPS: 300.0000 mg | ORAL_CAPSULE | Freq: Two times a day (BID) | ORAL | 0 refills | Status: DC

## 2021-12-05 NOTE — Assessment & Plan Note (Signed)
Continue amlodipine.  Follow pressures.  Follow metabolic panel.   

## 2021-12-05 NOTE — Telephone Encounter (Signed)
Pt sched w/ margaret tomorrow [phone]

## 2021-12-05 NOTE — Assessment & Plan Note (Signed)
Symptoms as outlined.  Symptoms and urine dip and micro - appear to be c/w UTI.  Treat with omnicef.  Stay hydrated.  Follow.  Also discussed further w/up and evaluation and treatment - including urology referral and PT for pelvic floor therapy.

## 2021-12-05 NOTE — Telephone Encounter (Signed)
Pt leaving urine today, added for virtual 430

## 2021-12-05 NOTE — Telephone Encounter (Signed)
See if she can leave a urine today and I can do a virtual visit with her this pm if would like.

## 2021-12-05 NOTE — Progress Notes (Signed)
Patient ID: Courtney Donovan, female   DOB: 12-29-1960, 61 y.o.   MRN: [id number]   Virtual Visit via video Note   All issues noted in this document were discussed and addressed.  No physical exam was performed (except for noted visual exam findings with Video Visits).   I connected with Courtney Donovan by a video enabled telemedicine application and verified that I am speaking with the correct person using two identifiers. Location patient: home Location provider: work  Persons participating in the virtual visit: patient, provider  The limitations, risks, security and privacy concerns of performing an evaluation and management service by video and the availability of in person appointments have been discussed.  It has also been discussed with the patient that there may be a patient responsible charge related to this service. The patient expressed understanding and agreed to proceed.   Reason for visit: work in appt  HPI: Work in with concerns regarding UTI.  Reports symptoms started one week ago.  Noticed increased urinary frequency and urgency.  Wearing POISE pads.  Starting a few days ao - increased urgency and dysuria - end urination discomfort.  States possibly noted a light pink on poise pad.  No vaginal itching or discharge.  No vaginal bleeding reported.  Some increased pressure.  No fever.  No vomiting.  Discussed previous urinary symptoms.  Describes what sounds like issues with bladder weakness/bladder incontinence.     ROS: See pertinent positives and negatives per HPI.  Past Medical History:  Diagnosis Date   Allergy    Anemia    Chronic headaches    Diabetes mellitus (HCC)    diet controlled   Hypercholesterolemia    Hypertension     Past Surgical History:  Procedure Laterality Date   ABDOMINAL HYSTERECTOMY  2011   supracervical hysterectomy   TONSILECTOMY/ADENOIDECTOMY WITH MYRINGOTOMY  1069   TONSILLECTOMY     TUBAL LIGATION  1996     Family History  Problem Relation Age of Onset   Hypertension Mother    Cancer Father        lung   Hypertension Father    Breast cancer Neg Hx     SOCIAL HX: reviewed.    Current Outpatient Medications:    amLODipine (NORVASC) 5 MG tablet, Take 1 tablet (5 mg total) by mouth daily., Disp: 90 tablet, Rfl: 1   aspirin 81 MG chewable tablet, Chew 81 mg by mouth daily., Disp: , Rfl:    cefdinir (OMNICEF) 300 MG capsule, Take 1 capsule (300 mg total) by mouth 2 (two) times daily., Disp: 10 capsule, Rfl: 0   Continuous Blood Gluc Transmit (DEXCOM G6 TRANSMITTER) MISC, Apply 1 Device topically every 14 (fourteen) days., Disp: , Rfl:    desonide (DESOWEN) 0.05 % cream, Apply topically 2 (two) times daily as needed (Rash). For 2 weeks, Disp: 60 g, Rfl: 1   fluticasone (FLONASE) 50 MCG/ACT nasal spray, Place 2 sprays into both nostrils daily., Disp: 48 g, Rfl: 3   LORazepam (ATIVAN) 0.5 MG tablet, 1/2 tablet q day prn, Disp: 20 tablet, Rfl: 0   magnesium oxide (MAG-OX) 400 (240 Mg) MG tablet, Take 1 tablet (400 mg total) by mouth daily., Disp: 30 tablet, Rfl: 2   metFORMIN (GLUCOPHAGE-XR) 500 MG 24 hr tablet, Take 1 tablet (500 mg total) by mouth daily., Disp: 90 tablet, Rfl: 1   Multiple Vitamin (MULTIVITAMIN) tablet, Take 1 tablet by mouth daily., Disp: , Rfl:    omeprazole (PRILOSEC) 40 MG  capsule, Take 1 capsule (40 mg total) by mouth daily., Disp: 90 capsule, Rfl: 3   Probiotic Product (ALIGN) 4 MG CAPS, Take one tablet q day, Disp: 30 capsule, Rfl: 0   Rimegepant Sulfate (NURTEC) 75 MG TBDP, Take 75 mg by mouth daily as needed., Disp: 10 tablet, Rfl: 0   rosuvastatin (CRESTOR) 20 MG tablet, Take 1 tablet (20 mg total) by mouth daily., Disp: 90 tablet, Rfl: 3   sertraline (ZOLOFT) 50 MG tablet, Take 1 tablet (50 mg total) by mouth daily. (Patient not taking: Reported on 12/05/2021), Disp: 30 tablet, Rfl: 1  EXAM:  GENERAL: alert, oriented, appears well and in no acute  distress  HEENT: atraumatic, conjunttiva clear, no obvious abnormalities on inspection of external nose and ears  NECK: normal movements of the head and neck  LUNGS: on inspection no signs of respiratory distress, breathing rate appears normal, no obvious gross SOB, gasping or wheezing  CV: no obvious cyanosis  PSYCH/NEURO: pleasant and cooperative, no obvious depression or anxiety, speech and thought processing grossly intact  ASSESSMENT AND PLAN:  Discussed the following assessment and plan:  Problem List Items Addressed This Visit     Diabetes mellitus ([place])    Low carb diet and exercise.  On metformin.  Follow met b and a1c.    Lab Results  Component Value Date   HGBA1C 7.0 (H) 09/07/2021       Hypertension    Continue amlodipine. Follow pressures.  Follow metabolic panel.       UTI (urinary tract infection)    Symptoms as outlined.  Symptoms and urine dip and micro - appear to be c/w UTI.  Treat with omnicef.  Stay hydrated.  Follow.  Also discussed further w/up and evaluation and treatment - including urology referral and PT for pelvic floor therapy.       Relevant Medications   cefdinir (OMNICEF) 300 MG capsule   Other Visit Diagnoses     Urinary urgency    -  Primary       Return if symptoms worsen or fail to improve.   I discussed the assessment and treatment plan with the patient. The patient was provided an opportunity to ask questions and all were answered. The patient agreed with the plan and demonstrated an understanding of the instructions.   The patient was advised to call back or seek an in-person evaluation if the symptoms worsen or if the condition fails to improve as anticipated.   Einar Pheasant, MD

## 2021-12-05 NOTE — Assessment & Plan Note (Signed)
Low carb diet and exercise.  On metformin.  Follow met b and a1c.    Lab Results  Component Value Date   HGBA1C 7.0 (H) 09/07/2021

## 2021-12-05 NOTE — Progress Notes (Signed)
Order placed for urine dip.  Orders already in for urine micro and urine culture.

## 2021-12-06 ENCOUNTER — Ambulatory Visit: Payer: No Typology Code available for payment source | Admitting: Family

## 2021-12-08 ENCOUNTER — Encounter: Payer: Self-pay | Admitting: Internal Medicine

## 2021-12-08 LAB — URINE CULTURE
MICRO NUMBER:: 14233814
SPECIMEN QUALITY:: ADEQUATE

## 2021-12-09 ENCOUNTER — Other Ambulatory Visit: Payer: Self-pay | Admitting: Internal Medicine

## 2021-12-09 MED ORDER — FLUCONAZOLE 150 MG PO TABS: ORAL_TABLET | ORAL | 0 refills | Status: DC

## 2021-12-09 NOTE — Progress Notes (Signed)
Rx sent in for diflucan

## 2021-12-27 ENCOUNTER — Other Ambulatory Visit (INDEPENDENT_AMBULATORY_CARE_PROVIDER_SITE_OTHER): Payer: No Typology Code available for payment source

## 2021-12-27 DIAGNOSIS — E7800 Pure hypercholesterolemia, unspecified: Secondary | ICD-10-CM | POA: Diagnosis not present

## 2021-12-27 DIAGNOSIS — E1165 Type 2 diabetes mellitus with hyperglycemia: Secondary | ICD-10-CM

## 2021-12-27 DIAGNOSIS — Z0000 Encounter for general adult medical examination without abnormal findings: Secondary | ICD-10-CM

## 2021-12-27 LAB — BASIC METABOLIC PANEL
BUN: 12 mg/dL (ref 6–23)
CO2: 31 mEq/L (ref 19–32)
Calcium: 9 mg/dL (ref 8.4–10.5)
Chloride: 104 mEq/L (ref 96–112)
Creatinine, Ser: 0.95 mg/dL (ref 0.40–1.20)
GFR: 65.59 mL/min (ref >60.00)
Glucose, Bld: 116 mg/dL — ABNORMAL HIGH (ref 70–99)
Potassium: 4.1 mEq/L (ref 3.5–5.1)
Sodium: 140 mEq/L (ref 135–145)

## 2021-12-27 LAB — HEPATIC FUNCTION PANEL
ALT: 9 U/L (ref 0–35)
AST: 11 U/L (ref 0–37)
Albumin: 4.3 g/dL (ref 3.5–5.2)
Alkaline Phosphatase: 85 U/L (ref 39–117)
Bilirubin, Direct: 0 mg/dL (ref 0.0–0.3)
Total Bilirubin: 0.4 mg/dL (ref 0.2–1.2)
Total Protein: 6.7 g/dL (ref 6.0–8.3)

## 2021-12-27 LAB — LIPID PANEL
Cholesterol: 207 mg/dL — ABNORMAL HIGH (ref 0–200)
HDL: 47.5 mg/dL (ref >39.00)
LDL Cholesterol: 143 mg/dL — ABNORMAL HIGH (ref 0–99)
NonHDL: 159.41
Total CHOL/HDL Ratio: 4
Triglycerides: 80 mg/dL (ref 0.0–149.0)
VLDL: 16 mg/dL (ref 0.0–40.0)

## 2021-12-27 LAB — HEMOGLOBIN A1C: Hgb A1c MFr Bld: 6.7 % — ABNORMAL HIGH (ref 4.6–6.5)

## 2021-12-28 ENCOUNTER — Encounter: Payer: Self-pay | Admitting: Internal Medicine

## 2021-12-28 LAB — HIV ANTIBODY (ROUTINE TESTING W REFLEX): HIV 1&2 Ab, 4th Generation: NONREACTIVE

## 2021-12-28 LAB — HEPATITIS C ANTIBODY: Hepatitis C Ab: NONREACTIVE

## 2021-12-28 NOTE — Telephone Encounter (Signed)
Please call and notify - regarding nasal congestion and drainage, would recommend nasacort nasal spray - two sprays each nostril one time per day.  Do this in the evening.  Saline nasal spray - flush nose in am and can do an afternoon as well (if needed).  Also robitussin - will thin mucus if needed.

## 2021-12-29 NOTE — Telephone Encounter (Signed)
Pt advised.

## 2021-12-30 ENCOUNTER — Encounter: Payer: Self-pay | Admitting: Internal Medicine

## 2021-12-30 ENCOUNTER — Ambulatory Visit (INDEPENDENT_AMBULATORY_CARE_PROVIDER_SITE_OTHER): Payer: No Typology Code available for payment source | Admitting: Internal Medicine

## 2021-12-30 VITALS — BP 130/72 | HR 69 | Temp 98.3°F | Resp 15 | Ht 59.0 in | Wt 178.4 lb

## 2021-12-30 DIAGNOSIS — E7800 Pure hypercholesterolemia, unspecified: Secondary | ICD-10-CM

## 2021-12-30 DIAGNOSIS — E1165 Type 2 diabetes mellitus with hyperglycemia: Secondary | ICD-10-CM

## 2021-12-30 DIAGNOSIS — R519 Headache, unspecified: Secondary | ICD-10-CM

## 2021-12-30 DIAGNOSIS — I10 Essential (primary) hypertension: Secondary | ICD-10-CM

## 2021-12-30 DIAGNOSIS — D649 Anemia, unspecified: Secondary | ICD-10-CM | POA: Diagnosis not present

## 2021-12-30 DIAGNOSIS — K219 Gastro-esophageal reflux disease without esophagitis: Secondary | ICD-10-CM | POA: Diagnosis not present

## 2021-12-30 DIAGNOSIS — E559 Vitamin D deficiency, unspecified: Secondary | ICD-10-CM

## 2021-12-30 DIAGNOSIS — E611 Iron deficiency: Secondary | ICD-10-CM

## 2021-12-30 DIAGNOSIS — J3489 Other specified disorders of nose and nasal sinuses: Secondary | ICD-10-CM

## 2021-12-30 DIAGNOSIS — F439 Reaction to severe stress, unspecified: Secondary | ICD-10-CM

## 2021-12-30 NOTE — Progress Notes (Unsigned)
Patient ID: Courtney Donovan, female   DOB: 1960/02/13, 61 y.o.   MRN: [id number]   Subjective:    Patient ID: Courtney Donovan, female    DOB: 1960/03/10, 61 y.o.   MRN: [id number]   Patient here for  Chief Complaint  Patient presents with   Medical Management of Chronic Issues   .   HPI Here to follow up regarding her blood pressure, cholesterol and blood sugar. States she is doing relatively well. Previous headaches.  Started on magnesium.  Has seen neurology - migraine headaches and occipital neuralgia. Discussed magnesium glycinate - less GI issues.  No chest pain reported.  Breathing stable.  No increased cough or congestion reported.  No vomiting.  Blood pressure doing well.  States pm sugars 142-152.  126 this am.     Past Medical History:  Diagnosis Date   Allergy    Anemia    Chronic headaches    Diabetes mellitus (HCC)    diet controlled   Hypercholesterolemia    Hypertension    Past Surgical History:  Procedure Laterality Date   ABDOMINAL HYSTERECTOMY  2011   supracervical hysterectomy   TONSILECTOMY/ADENOIDECTOMY WITH MYRINGOTOMY  12   TONSILLECTOMY     TUBAL LIGATION  1996   Family History  Problem Relation Age of Onset   Hypertension Mother    Cancer Father        lung   Hypertension Father    Breast cancer Neg Hx    Social History   Socioeconomic History   Marital status: Married    Spouse name: Not on file   Number of children: 2   Years of education: Not on file   Highest education level: Not on file  Occupational History    Employer: [employer]  Tobacco Use   Smoking status: Never   Smokeless tobacco: Never  Vaping Use   Vaping Use: Never used  Substance and Sexual Activity   Alcohol use: No    Alcohol/week: 0.0 standard drinks of alcohol   Drug use: No   Sexual activity: Yes  Other Topics Concern   Not on file  Social History Narrative   Not on file   Social Determinants of Health   Financial Resource Strain:  Not on file  Food Insecurity: Not on file  Transportation Needs: Not on file  Physical Activity: Not on file  Stress: Not on file  Social Connections: Not on file     Review of Systems  Constitutional:  Negative for appetite change and unexpected weight change.  HENT:  Negative for congestion and sinus pressure.   Respiratory:  Negative for cough, chest tightness and shortness of breath.   Cardiovascular:  Negative for chest pain, palpitations and leg swelling.  Gastrointestinal:  Negative for abdominal pain, nausea and vomiting.  Genitourinary:  Negative for difficulty urinating and dysuria.  Musculoskeletal:  Negative for joint swelling and myalgias.  Skin:  Negative for color change and rash.  Neurological:  Negative for dizziness, light-headedness and headaches.  Psychiatric/Behavioral:  Negative for agitation and dysphoric mood.        Objective:     BP 130/72 (BP Location: Left Arm, Patient Position: Sitting, Cuff Size: Large)   Pulse 69   Temp 98.3 F (36.8 C) (Temporal)   Resp 15   Ht '4\' 11"'$  (1.499 m)   Wt 178 lb 6.4 oz (80.9 kg)   SpO2 99%   BMI 36.03 kg/m  Wt Readings from Last 3 Encounters:  12/30/21 178 lb 6.4 oz (80.9 kg)  11/04/21 176 lb 8 oz (80.1 kg)  09/09/21 175 lb 12.8 oz (79.7 kg)    Physical Exam Vitals reviewed.  Constitutional:      General: She is not in acute distress.    Appearance: Normal appearance.  HENT:     Head: Normocephalic and atraumatic.     Right Ear: External ear normal.     Left Ear: External ear normal.  Eyes:     General: No scleral icterus.       Right eye: No discharge.        Left eye: No discharge.     Conjunctiva/sclera: Conjunctivae normal.  Neck:     Thyroid: No thyromegaly.  Cardiovascular:     Rate and Rhythm: Normal rate and regular rhythm.  Pulmonary:     Effort: No respiratory distress.     Breath sounds: Normal breath sounds. No wheezing.  Abdominal:     General: Bowel sounds are normal.      Palpations: Abdomen is soft.     Tenderness: There is no abdominal tenderness.  Musculoskeletal:        General: No swelling or tenderness.     Cervical back: Neck supple. No tenderness.  Lymphadenopathy:     Cervical: No cervical adenopathy.  Skin:    Findings: No erythema or rash.  Neurological:     Mental Status: She is alert.  Psychiatric:        Mood and Affect: Mood normal.        Behavior: Behavior normal.      Outpatient Encounter Medications as of 12/30/2021  Medication Sig   amLODipine (NORVASC) 5 MG tablet Take 1 tablet (5 mg total) by mouth daily.   aspirin 81 MG chewable tablet Chew 81 mg by mouth daily.   cefdinir (OMNICEF) 300 MG capsule Take 1 capsule (300 mg total) by mouth 2 (two) times daily.   Continuous Blood Gluc Transmit (DEXCOM G6 TRANSMITTER) MISC Apply 1 Device topically every 14 (fourteen) days.   desonide (DESOWEN) 0.05 % cream Apply topically 2 (two) times daily as needed (Rash). For 2 weeks   fluconazole (DIFLUCAN) 150 MG tablet Take one tablet x 1 and then repeat x 1 in 3 days if persistent symptoms.   fluticasone (FLONASE) 50 MCG/ACT nasal spray Place 2 sprays into both nostrils daily.   LORazepam (ATIVAN) 0.5 MG tablet 1/2 tablet q day prn   magnesium oxide (MAG-OX) 400 (240 Mg) MG tablet Take 1 tablet (400 mg total) by mouth daily.   metFORMIN (GLUCOPHAGE-XR) 500 MG 24 hr tablet Take 1 tablet (500 mg total) by mouth daily.   Multiple Vitamin (MULTIVITAMIN) tablet Take 1 tablet by mouth daily.   omeprazole (PRILOSEC) 40 MG capsule Take 1 capsule (40 mg total) by mouth daily.   Probiotic Product (ALIGN) 4 MG CAPS Take one tablet q day   Rimegepant Sulfate (NURTEC) 75 MG TBDP Take 75 mg by mouth daily as needed.   rosuvastatin (CRESTOR) 20 MG tablet Take 1 tablet (20 mg total) by mouth daily.   sertraline (ZOLOFT) 50 MG tablet Take 1 tablet (50 mg total) by mouth daily.   No facility-administered encounter medications on file as of 12/30/2021.      Lab Results  Component Value Date   WBC 5.9 09/07/2021   HGB 12.4 09/07/2021   HCT 37.2 09/07/2021   PLT 202.0 09/07/2021   GLUCOSE 116 (H) 12/27/2021   CHOL 207 (H) 12/27/2021  TRIG 80.0 12/27/2021   HDL 47.50 12/27/2021   LDLDIRECT 159.5 01/16/2013   LDLCALC 143 (H) 12/27/2021   ALT 9 12/27/2021   AST 11 12/27/2021   NA 140 12/27/2021   K 4.1 12/27/2021   CL 104 12/27/2021   CREATININE 0.95 12/27/2021   BUN 12 12/27/2021   CO2 31 12/27/2021   TSH 2.76 09/07/2021   HGBA1C 6.7 (H) 12/27/2021   MICROALBUR <0.7 11/04/2021       Assessment & Plan:   Problem List Items Addressed This Visit   None   Einar Pheasant, MD

## 2022-01-02 ENCOUNTER — Encounter: Payer: Self-pay | Admitting: Internal Medicine

## 2022-01-02 DIAGNOSIS — J3489 Other specified disorders of nose and nasal sinuses: Secondary | ICD-10-CM | POA: Insufficient documentation

## 2022-01-02 NOTE — Assessment & Plan Note (Signed)
Has a history of anemia.  Follow cbc.  

## 2022-01-02 NOTE — Assessment & Plan Note (Signed)
Has been seeing neurology.  Diagnosed with migraine headaches and occipital neuralgia.  Discussed magnesium.  Had previously referred to pulmonary for evaluation - sleep apnea.

## 2022-01-02 NOTE — Assessment & Plan Note (Signed)
Continue zoloft.  Appears to be handling things well.  Follow.

## 2022-01-02 NOTE — Assessment & Plan Note (Signed)
Check vitamin D level with next labs.  ?

## 2022-01-02 NOTE — Assessment & Plan Note (Signed)
Continue prilosec

## 2022-01-02 NOTE — Assessment & Plan Note (Signed)
Nasal congestion.  Ears better.  No fever.  Symptoms improved.  Steroid nasal spray/mucinex/robitussin DM as directed.  Follow.  Call with update.

## 2022-01-02 NOTE — Assessment & Plan Note (Signed)
Continue amlodipine.  Follow pressures.  Follow metabolic panel.   

## 2022-01-02 NOTE — Assessment & Plan Note (Signed)
Low carb diet and exercise.  On metformin.  Follow met b and a1c.    Lab Results  Component Value Date   HGBA1C 6.7 (H) 12/27/2021

## 2022-01-02 NOTE — Assessment & Plan Note (Signed)
Saw GI previously.  Hemoccult cards negative.  Follow hgb and iron studies.

## 2022-01-02 NOTE — Assessment & Plan Note (Signed)
Continue crestor.  Low cholesterol diet and exercise.  Follow lipid panel and liver function tests.   Lab Results  Component Value Date   CHOL 207 (H) 12/27/2021   HDL 47.50 12/27/2021   LDLCALC 143 (H) 12/27/2021   LDLDIRECT 159.5 01/16/2013   TRIG 80.0 12/27/2021   CHOLHDL 4 12/27/2021

## 2022-03-07 ENCOUNTER — Other Ambulatory Visit: Payer: Self-pay | Admitting: Internal Medicine

## 2022-03-07 ENCOUNTER — Encounter: Payer: Self-pay | Admitting: Internal Medicine

## 2022-03-07 MED ORDER — ROSUVASTATIN CALCIUM 10 MG PO TABS: 10.0000 mg | ORAL_TABLET | Freq: Every day | ORAL | 0 refills | Status: DC

## 2022-03-23 ENCOUNTER — Encounter: Payer: Self-pay | Admitting: Internal Medicine

## 2022-03-23 DIAGNOSIS — M79606 Pain in leg, unspecified: Secondary | ICD-10-CM

## 2022-03-23 NOTE — Telephone Encounter (Signed)
Spoke with pt and she stated that the pain/stiffness is in her left leg. She stated that it starts in the left hip and radiates into her thigh and calf. The pain is intermittent and occurs when walking, sitting, laying in bed. When the pain occurs the pain level is a 6. Pt stated that this has been going on for about 3-4 weeks now. Pt would like to see if there is any labs that could be drawn to determine if this could be a deficiency of something or just sciatica.

## 2022-03-23 NOTE — Telephone Encounter (Signed)
Pt is aware and gave a verbal understanding.  

## 2022-03-23 NOTE — Telephone Encounter (Signed)
I added an inflammation marker, cbc and B12.  Already scheduled to have vitamin D and metabolic panel checked.  Not sure that a deficiency in one of these would cause her symptoms, but we can certainly check.  If any acute change or problems prior to scheduled appt, needs to be seen.

## 2022-03-28 ENCOUNTER — Other Ambulatory Visit: Payer: No Typology Code available for payment source

## 2022-03-30 ENCOUNTER — Other Ambulatory Visit (INDEPENDENT_AMBULATORY_CARE_PROVIDER_SITE_OTHER): Payer: No Typology Code available for payment source

## 2022-03-30 ENCOUNTER — Other Ambulatory Visit: Payer: Self-pay | Admitting: Internal Medicine

## 2022-03-30 DIAGNOSIS — I10 Essential (primary) hypertension: Secondary | ICD-10-CM | POA: Diagnosis not present

## 2022-03-30 DIAGNOSIS — E559 Vitamin D deficiency, unspecified: Secondary | ICD-10-CM | POA: Diagnosis not present

## 2022-03-30 DIAGNOSIS — M79606 Pain in leg, unspecified: Secondary | ICD-10-CM | POA: Diagnosis not present

## 2022-03-30 DIAGNOSIS — E7800 Pure hypercholesterolemia, unspecified: Secondary | ICD-10-CM

## 2022-03-30 DIAGNOSIS — Z1231 Encounter for screening mammogram for malignant neoplasm of breast: Secondary | ICD-10-CM

## 2022-03-30 DIAGNOSIS — E1165 Type 2 diabetes mellitus with hyperglycemia: Secondary | ICD-10-CM

## 2022-03-30 LAB — SEDIMENTATION RATE: Sed Rate: 8 mm/hr (ref 0–30)

## 2022-03-30 LAB — BASIC METABOLIC PANEL
BUN: 13 mg/dL (ref 6–23)
CO2: 31 mEq/L (ref 19–32)
Calcium: 9.6 mg/dL (ref 8.4–10.5)
Chloride: 103 mEq/L (ref 96–112)
Creatinine, Ser: 0.89 mg/dL (ref 0.40–1.20)
GFR: 70.8 mL/min (ref >60.00)
Glucose, Bld: 119 mg/dL — ABNORMAL HIGH (ref 70–99)
Potassium: 4.4 mEq/L (ref 3.5–5.1)
Sodium: 140 mEq/L (ref 135–145)

## 2022-03-30 LAB — CBC WITH DIFFERENTIAL/PLATELET
Basophils Absolute: 0 10*3/uL (ref 0.0–0.1)
Basophils Relative: 0.4 % (ref 0.0–3.0)
Eosinophils Absolute: 0.1 10*3/uL (ref 0.0–0.7)
Eosinophils Relative: 1.3 % (ref 0.0–5.0)
HCT: 38.8 % (ref 36.0–46.0)
Hemoglobin: 12.9 g/dL (ref 12.0–15.0)
Lymphocytes Relative: 35 % (ref 12.0–46.0)
Lymphs Abs: 2.1 10*3/uL (ref 0.7–4.0)
MCHC: 33.3 g/dL (ref 30.0–36.0)
MCV: 87.5 fl (ref 78.0–100.0)
Monocytes Absolute: 0.4 10*3/uL (ref 0.1–1.0)
Monocytes Relative: 6 % (ref 3.0–12.0)
Neutro Abs: 3.5 10*3/uL (ref 1.4–7.7)
Neutrophils Relative %: 57.3 % (ref 43.0–77.0)
Platelets: 235 10*3/uL (ref 150.0–400.0)
RBC: 4.44 Mil/uL (ref 3.87–5.11)
RDW: 13.8 % (ref 11.5–15.5)
WBC: 6.1 10*3/uL (ref 4.0–10.5)

## 2022-03-30 LAB — VITAMIN D 25 HYDROXY (VIT D DEFICIENCY, FRACTURES): VITD: 15.24 ng/mL — ABNORMAL LOW (ref 30.00–100.00)

## 2022-03-30 LAB — VITAMIN B12: Vitamin B-12: 353 pg/mL (ref 211–911)

## 2022-03-31 ENCOUNTER — Other Ambulatory Visit (HOSPITAL_COMMUNITY)
Admission: RE | Admit: 2022-03-31 | Discharge: 2022-03-31 | Disposition: A | Discharge status: Home / Self Care | Payer: No Typology Code available for payment source | Source: Ambulatory Visit | Attending: Internal Medicine | Admitting: Internal Medicine

## 2022-03-31 ENCOUNTER — Ambulatory Visit (INDEPENDENT_AMBULATORY_CARE_PROVIDER_SITE_OTHER): Payer: No Typology Code available for payment source | Admitting: Internal Medicine

## 2022-03-31 ENCOUNTER — Other Ambulatory Visit (INDEPENDENT_AMBULATORY_CARE_PROVIDER_SITE_OTHER): Payer: No Typology Code available for payment source

## 2022-03-31 ENCOUNTER — Other Ambulatory Visit: Payer: Self-pay | Admitting: Internal Medicine

## 2022-03-31 VITALS — BP 132/74 | HR 72 | Temp 98.2°F | Resp 16 | Ht 59.0 in | Wt 179.4 lb

## 2022-03-31 DIAGNOSIS — Z0000 Encounter for general adult medical examination without abnormal findings: Secondary | ICD-10-CM | POA: Diagnosis not present

## 2022-03-31 DIAGNOSIS — R102 Pelvic and perineal pain unspecified side: Secondary | ICD-10-CM

## 2022-03-31 DIAGNOSIS — R5383 Other fatigue: Secondary | ICD-10-CM

## 2022-03-31 DIAGNOSIS — Z124 Encounter for screening for malignant neoplasm of cervix: Secondary | ICD-10-CM

## 2022-03-31 DIAGNOSIS — E611 Iron deficiency: Secondary | ICD-10-CM

## 2022-03-31 DIAGNOSIS — E7800 Pure hypercholesterolemia, unspecified: Secondary | ICD-10-CM | POA: Diagnosis not present

## 2022-03-31 DIAGNOSIS — E1165 Type 2 diabetes mellitus with hyperglycemia: Secondary | ICD-10-CM | POA: Diagnosis not present

## 2022-03-31 DIAGNOSIS — F439 Reaction to severe stress, unspecified: Secondary | ICD-10-CM | POA: Diagnosis not present

## 2022-03-31 DIAGNOSIS — Z1211 Encounter for screening for malignant neoplasm of colon: Secondary | ICD-10-CM | POA: Diagnosis not present

## 2022-03-31 DIAGNOSIS — R32 Unspecified urinary incontinence: Secondary | ICD-10-CM

## 2022-03-31 DIAGNOSIS — D649 Anemia, unspecified: Secondary | ICD-10-CM

## 2022-03-31 DIAGNOSIS — E559 Vitamin D deficiency, unspecified: Secondary | ICD-10-CM | POA: Diagnosis not present

## 2022-03-31 DIAGNOSIS — I10 Essential (primary) hypertension: Secondary | ICD-10-CM

## 2022-03-31 DIAGNOSIS — E538 Deficiency of other specified B group vitamins: Secondary | ICD-10-CM

## 2022-03-31 DIAGNOSIS — K219 Gastro-esophageal reflux disease without esophagitis: Secondary | ICD-10-CM

## 2022-03-31 DIAGNOSIS — M5442 Lumbago with sciatica, left side: Secondary | ICD-10-CM

## 2022-03-31 LAB — LIPID PANEL
Cholesterol: 207 mg/dL — ABNORMAL HIGH (ref 0–200)
HDL: 50.7 mg/dL (ref >39.00)
LDL Cholesterol: 136 mg/dL — ABNORMAL HIGH (ref 0–99)
NonHDL: 156.17
Total CHOL/HDL Ratio: 4
Triglycerides: 103 mg/dL (ref 0.0–149.0)
VLDL: 20.6 mg/dL (ref 0.0–40.0)

## 2022-03-31 LAB — HEPATIC FUNCTION PANEL
ALT: 9 U/L (ref 0–35)
AST: 12 U/L (ref 0–37)
Albumin: 4.4 g/dL (ref 3.5–5.2)
Alkaline Phosphatase: 78 U/L (ref 39–117)
Bilirubin, Direct: 0.1 mg/dL (ref 0.0–0.3)
Total Bilirubin: 0.3 mg/dL (ref 0.2–1.2)
Total Protein: 7 g/dL (ref 6.0–8.3)

## 2022-03-31 LAB — HEMOGLOBIN A1C: Hgb A1c MFr Bld: 6.9 % — ABNORMAL HIGH (ref 4.6–6.5)

## 2022-03-31 MED ORDER — VITAMIN D (ERGOCALCIFEROL) 1.25 MG (50000 UNIT) PO CAPS: 50000.0000 [IU] | ORAL_CAPSULE | ORAL | 0 refills | Status: DC

## 2022-03-31 NOTE — Assessment & Plan Note (Signed)
Continue crestor.  On 10mg  q day now. Low cholesterol diet and exercise.  Follow lipid panel and liver function tests.   Lab Results  Component Value Date   CHOL 207 (H) 03/31/2022   HDL 50.70 03/31/2022   LDLCALC 136 (H) 03/31/2022   LDLDIRECT 159.5 01/16/2013   TRIG 103.0 03/31/2022   CHOLHDL 4 03/31/2022  The 10-year ASCVD risk score (Arnett DK, et al., 2019) is: 17.3%   Values used to calculate the score:     Age: 62 years     Sex: Female     Is Non-Hispanic African American: Yes     Diabetic: Yes     Tobacco smoker: No     Systolic Blood Pressure: Q000111Q mmHg     Is BP treated: Yes     HDL Cholesterol: 50.7 mg/dL     Total Cholesterol: 207 mg/dL

## 2022-03-31 NOTE — Progress Notes (Signed)
Order placed for lipid panel, liver panel and A1c.

## 2022-03-31 NOTE — Assessment & Plan Note (Signed)
Physical today 03/31/22.   Mammogram 01/04/21 - Birads I. F/u mammogram scheduled.  dolonoscopy 11/2012.  Recommended f/u in 10 years.  Hemoccult cards negative.

## 2022-03-31 NOTE — Progress Notes (Unsigned)
Subjective:    Patient ID: Courtney Donovan, female    DOB: [birth year], 62 y.o.   MRN: [id number]  Patient here for  Chief Complaint  Patient presents with   Annual Exam    HPI Here for physical exam.  Reports having several concerns.  Describes pain/aching - legs and low back.  Has noticed pain - low back - extending down left leg and into the top of her calf.  Taking tylenol.  Using heating pad.  Has also noticed if she stretches her toes - will "lock" (cramp).  Feels stiff I am.  Hurts if walks a lot.  Stairs bother her.  Discussed further w/up.  Discussed stretches.  Agreeable to PT.  No chest pain or sob reported.  No cough or congestion.  Does report some lower pelvic pain.  Also increased problems with bladder incontinence.  Increased urgency and frequency.  Discussed labs.     Past Medical History:  Diagnosis Date   Allergy    Anemia    Chronic headaches    Diabetes mellitus (HCC)    diet controlled   Hypercholesterolemia    Hypertension    Past Surgical History:  Procedure Laterality Date   ABDOMINAL HYSTERECTOMY  2011   supracervical hysterectomy   TONSILECTOMY/ADENOIDECTOMY WITH MYRINGOTOMY  22   TONSILLECTOMY     TUBAL LIGATION  1996   Family History  Problem Relation Age of Onset   Hypertension Mother    Cancer Father        lung   Hypertension Father    Breast cancer Neg Hx    Social History   Socioeconomic History   Marital status: Married    Spouse name: Not on file   Number of children: 2   Years of education: Not on file   Highest education level: Not on file  Occupational History    Employer: [employer]  Tobacco Use   Smoking status: Never   Smokeless tobacco: Never  Vaping Use   Vaping Use: Never used  Substance and Sexual Activity   Alcohol use: No    Alcohol/week: 0.0 standard drinks of alcohol   Drug use: No   Sexual activity: Yes  Other Topics Concern   Not on file  Social History Narrative   Not on file   Social  Determinants of Health   Financial Resource Strain: Not on file  Food Insecurity: Not on file  Transportation Needs: Not on file  Physical Activity: Not on file  Stress: Not on file  Social Connections: Not on file     Review of Systems  Constitutional:  Negative for appetite change and unexpected weight change.  HENT:  Negative for congestion, sinus pressure and sore throat.   Eyes:  Negative for pain and visual disturbance.  Respiratory:  Negative for cough, chest tightness and shortness of breath.   Cardiovascular:  Negative for chest pain and palpitations.  Gastrointestinal:  Negative for diarrhea, nausea and vomiting.  Genitourinary:  Positive for frequency and pelvic pain. Negative for difficulty urinating and dysuria.  Musculoskeletal:  Positive for back pain.       Low back and left leg pain as outlined.  Feels stiff.   Skin:  Negative for color change and rash.  Neurological:  Negative for dizziness and headaches.  Hematological:  Negative for adenopathy. Does not bruise/bleed easily.  Psychiatric/Behavioral:  Negative for agitation and dysphoric mood.        Objective:     BP 132/74  Pulse 72   Temp 98.2 F (36.8 C)   Resp 16   Ht 4\' 11"  (1.499 m)   Wt 179 lb 6.4 oz (81.4 kg)   SpO2 99%   BMI 36.23 kg/m  Wt Readings from Last 3 Encounters:  03/31/22 179 lb 6.4 oz (81.4 kg)  12/30/21 178 lb 6.4 oz (80.9 kg)  11/04/21 176 lb 8 oz (80.1 kg)    Physical Exam Vitals reviewed.  Constitutional:      General: She is not in acute distress.    Appearance: Normal appearance. She is well-developed.  HENT:     Head: Normocephalic and atraumatic.     Right Ear: External ear normal.     Left Ear: External ear normal.  Eyes:     General: No scleral icterus.       Right eye: No discharge.        Left eye: No discharge.     Conjunctiva/sclera: Conjunctivae normal.  Neck:     Thyroid: No thyromegaly.  Cardiovascular:     Rate and Rhythm: Normal rate and regular  rhythm.  Pulmonary:     Effort: No tachypnea, accessory muscle usage or respiratory distress.     Breath sounds: Normal breath sounds. No decreased breath sounds or wheezing.  Chest:  Breasts:    Right: No inverted nipple, mass, nipple discharge or tenderness (no axillary adenopathy).     Left: No inverted nipple, mass, nipple discharge or tenderness (no axilarry adenopathy).  Abdominal:     General: Bowel sounds are normal.     Palpations: Abdomen is soft.     Tenderness: There is no abdominal tenderness.  Musculoskeletal:        General: No swelling or tenderness.     Cervical back: Neck supple.     Comments: Increased pulling and discomfort with SLR.  No pain with abduction/adduction - lower extremity.   Lymphadenopathy:     Cervical: No cervical adenopathy.  Skin:    Findings: No erythema or rash.  Neurological:     Mental Status: She is alert and oriented to person, place, and time.  Psychiatric:        Mood and Affect: Mood normal.        Behavior: Behavior normal.      Outpatient Encounter Medications as of 03/31/2022  Medication Sig   Vitamin D, Ergocalciferol, (DRISDOL) 1.25 MG (50000 UNIT) CAPS capsule Take 1 capsule (50,000 Units total) by mouth every 7 (seven) days.   amLODipine (NORVASC) 5 MG tablet Take 1 tablet (5 mg total) by mouth daily.   aspirin 81 MG chewable tablet Chew 81 mg by mouth daily.   desonide (DESOWEN) 0.05 % cream Apply topically 2 (two) times daily as needed (Rash). For 2 weeks   fluticasone (FLONASE) 50 MCG/ACT nasal spray Place 2 sprays into both nostrils daily.   magnesium oxide (MAG-OX) 400 (240 Mg) MG tablet Take 1 tablet (400 mg total) by mouth daily.   metFORMIN (GLUCOPHAGE-XR) 500 MG 24 hr tablet Take 1 tablet (500 mg total) by mouth daily.   Multiple Vitamin (MULTIVITAMIN) tablet Take 1 tablet by mouth daily.   omeprazole (PRILOSEC) 40 MG capsule Take 1 capsule (40 mg total) by mouth daily.   Rimegepant Sulfate (NURTEC) 75 MG TBDP Take  75 mg by mouth daily as needed.   rosuvastatin (CRESTOR) 10 MG tablet Take 1 tablet (10 mg total) by mouth daily.   [DISCONTINUED] Continuous Blood Gluc Transmit (DEXCOM G6 TRANSMITTER) MISC Apply 1 Device  topically every 14 (fourteen) days.   [DISCONTINUED] fluconazole (DIFLUCAN) 150 MG tablet Take one tablet x 1 and then repeat x 1 in 3 days if persistent symptoms.   [DISCONTINUED] LORazepam (ATIVAN) 0.5 MG tablet 1/2 tablet q day prn   [DISCONTINUED] Probiotic Product (ALIGN) 4 MG CAPS Take one tablet q day   [DISCONTINUED] rosuvastatin (CRESTOR) 20 MG tablet Take 1 tablet (20 mg total) by mouth daily.   [DISCONTINUED] sertraline (ZOLOFT) 50 MG tablet Take 1 tablet (50 mg total) by mouth daily.   No facility-administered encounter medications on file as of 03/31/2022.     Lab Results  Component Value Date   WBC 6.1 03/30/2022   HGB 12.9 03/30/2022   HCT 38.8 03/30/2022   PLT 235.0 03/30/2022   GLUCOSE 119 (H) 03/30/2022   CHOL 207 (H) 03/31/2022   TRIG 103.0 03/31/2022   HDL 50.70 03/31/2022   LDLDIRECT 159.5 01/16/2013   LDLCALC 136 (H) 03/31/2022   ALT 9 03/31/2022   AST 12 03/31/2022   NA 140 03/30/2022   K 4.4 03/30/2022   CL 103 03/30/2022   CREATININE 0.89 03/30/2022   BUN 13 03/30/2022   CO2 31 03/30/2022   TSH 2.76 09/07/2021   HGBA1C 6.9 (H) 03/31/2022   MICROALBUR <0.7 11/04/2021    No results found.     Assessment & Plan:  Routine general medical examination at a health care facility  Hypercholesterolemia Assessment & Plan: Continue crestor.  On 10mg  q day now. Low cholesterol diet and exercise.  Follow lipid panel and liver function tests.   Lab Results  Component Value Date   CHOL 207 (H) 03/31/2022   HDL 50.70 03/31/2022   LDLCALC 136 (H) 03/31/2022   LDLDIRECT 159.5 01/16/2013   TRIG 103.0 03/31/2022   CHOLHDL 4 03/31/2022  The 10-year ASCVD risk score (Arnett DK, et al., 2019) is: 17.3%   Values used to calculate the score:     Age: 37 years      Sex: Female     Is Non-Hispanic African American: Yes     Diabetic: Yes     Tobacco smoker: No     Systolic Blood Pressure: Q000111Q mmHg     Is BP treated: Yes     HDL Cholesterol: 50.7 mg/dL     Total Cholesterol: 207 mg/dL    Colon cancer screening  Health care maintenance Assessment & Plan: Physical today 03/31/22.   Mammogram 01/04/21 - [facility]ads I. F/u mammogram scheduled in 04/2022. Colonoscopy 11/2012.  Recommended f/u in 10 years.  Dr Tiffany Kocher. Will need to schedule f/u with GI for screening.    Cervical cancer screening -     Cytology - PAP  Vitamin D deficiency Assessment & Plan: Discussed recent lab results.  Vitamin D low - 15.  Start ergocalciferol 50,000 units per week.  Follow.     Stress Assessment & Plan: Continue zoloft.  Appears to be handling things relatively well.  Follow.    Pelvic pain Assessment & Plan: Increased pelvic pain.  Persistent.  Check pelvic ultrasound.  Has seen gyn.  Discussed possible PFT.   Orders: -     US PELVIC COMPLETE WITH TRANSVAGINAL; Future  Iron deficiency Assessment & Plan: Saw GI previously.  Hemoccult cards negative.  Follow hgb and iron studies.    Primary hypertension Assessment & Plan: Continue amlodipine. Follow pressures.  Follow metabolic panel.    Gastroesophageal reflux disease, unspecified whether esophagitis present Assessment & Plan: Continue prilosec.  Type 2 diabetes mellitus with hyperglycemia, without long-term current use of insulin (HCC) Assessment & Plan: Low carb diet and exercise.  On metformin.  Follow met b and a1c.    Lab Results  Component Value Date   HGBA1C 6.9 (H) 03/31/2022     Other fatigue Assessment & Plan: Discussed.  Feel is multifactorial, but I do feel her untreated sleep apnea is definitely contributing.  Has been referred to pulmonary.  Needs to reschedule appt.    Anemia, unspecified type Assessment & Plan: Has a history of anemia.  Follow cbc.    B12  deficiency Assessment & Plan: Continue B12 supplements.    Urinary incontinence, unspecified type Assessment & Plan: Urinary incontinence and increased frequency as outlined.  Discussed further evaluation and treatment options.  Discussed PFT.  Refer to urology for further evaluation and treatment.     Bilateral low back pain with left-sided sciatica, unspecified chronicity Assessment & Plan: Low back pain and pain down left leg as outlined.  Discussed stretches.  Taking tylenol.  Refer to PT for further evaluation and treatment.   Orders: -     Ambulatory referral to Physical Therapy  Other orders -     Vitamin D (Ergocalciferol); Take 1 capsule (50,000 Units total) by mouth every 7 (seven) days.  Dispense: 12 capsule; Refill: 0     Einar Pheasant, MD

## 2022-04-01 ENCOUNTER — Encounter: Payer: Self-pay | Admitting: Internal Medicine

## 2022-04-01 DIAGNOSIS — E538 Deficiency of other specified B group vitamins: Secondary | ICD-10-CM | POA: Insufficient documentation

## 2022-04-01 DIAGNOSIS — R32 Unspecified urinary incontinence: Secondary | ICD-10-CM | POA: Insufficient documentation

## 2022-04-01 DIAGNOSIS — M545 Low back pain, unspecified: Secondary | ICD-10-CM | POA: Insufficient documentation

## 2022-04-01 NOTE — Assessment & Plan Note (Signed)
Has a history of anemia.  Follow cbc.  

## 2022-04-01 NOTE — Assessment & Plan Note (Signed)
Saw GI previously.  Hemoccult cards negative.  Follow hgb and iron studies.  

## 2022-04-01 NOTE — Assessment & Plan Note (Signed)
Continue amlodipine.  Follow pressures.  Follow metabolic panel.   

## 2022-04-01 NOTE — Assessment & Plan Note (Signed)
Low back pain and pain down left leg as outlined.  Discussed stretches.  Taking tylenol.  Refer to PT for further evaluation and treatment.

## 2022-04-01 NOTE — Assessment & Plan Note (Signed)
Discussed recent lab results.  Vitamin D low - 15.  Start ergocalciferol 50,000 units per week.  Follow.

## 2022-04-01 NOTE — Assessment & Plan Note (Signed)
Continue B12 supplements.  

## 2022-04-01 NOTE — Assessment & Plan Note (Signed)
Low carb diet and exercise.  On metformin.  Follow met b and a1c.    Lab Results  Component Value Date   HGBA1C 6.9 (H) 03/31/2022

## 2022-04-01 NOTE — Assessment & Plan Note (Signed)
Increased pelvic pain.  Persistent.  Check pelvic ultrasound.  Has seen gyn.  Discussed possible PFT.

## 2022-04-01 NOTE — Assessment & Plan Note (Signed)
Discussed.  Feel is multifactorial, but I do feel her untreated sleep apnea is definitely contributing.  Has been referred to pulmonary.  Needs to reschedule appt.

## 2022-04-01 NOTE — Assessment & Plan Note (Signed)
Continue prilosec

## 2022-04-01 NOTE — Assessment & Plan Note (Addendum)
Continue zoloft.  Appears to be handling things relatively well.  Follow.  °

## 2022-04-01 NOTE — Assessment & Plan Note (Signed)
Urinary incontinence and increased frequency as outlined.  Discussed further evaluation and treatment options.  Discussed PFT.  Refer to urology for further evaluation and treatment.

## 2022-04-05 LAB — CYTOLOGY - PAP
Comment: NEGATIVE
Diagnosis: NEGATIVE
High risk HPV: NEGATIVE

## 2022-04-06 ENCOUNTER — Encounter: Payer: Self-pay | Admitting: Internal Medicine

## 2022-04-06 ENCOUNTER — Other Ambulatory Visit: Payer: Self-pay

## 2022-04-06 MED ORDER — CLINDAMYCIN PHOSPHATE 2 % VA CREA: 1.0000 | TOPICAL_CREAM | Freq: Every day | VAGINAL | 0 refills | Status: DC

## 2022-04-06 NOTE — Telephone Encounter (Signed)
No generic and since she is allergic to metrogel - cannot use this or flagyl to treat.

## 2022-04-12 MED ORDER — NYSTATIN 100000 UNIT/GM EX CREA: 1.0000 | TOPICAL_CREAM | Freq: Two times a day (BID) | CUTANEOUS | 0 refills | Status: AC

## 2022-04-12 NOTE — Telephone Encounter (Signed)
Nystatin sent in. My chart sent to patient

## 2022-04-12 NOTE — Telephone Encounter (Signed)
She was prescribed cleocin and has one more dose, now having some irritation and discomfort on the outside vaginal area. Wondering if there is something she can use for this.

## 2022-04-12 NOTE — Telephone Encounter (Signed)
If irritation on the outside, may be related to yeast.  Can send in nystatin cream - apply to affected area bid.  I can send in rx if needed.  If persistent problems, let us know.

## 2022-04-14 ENCOUNTER — Ambulatory Visit
Admission: RE | Admit: 2022-04-14 | Discharge: 2022-04-14 | Disposition: A | Discharge status: Home / Self Care | Payer: No Typology Code available for payment source | Source: Ambulatory Visit | Attending: Internal Medicine | Admitting: Internal Medicine

## 2022-04-14 DIAGNOSIS — Z1231 Encounter for screening mammogram for malignant neoplasm of breast: Secondary | ICD-10-CM | POA: Diagnosis not present

## 2022-04-18 ENCOUNTER — Ambulatory Visit
Admission: RE | Admit: 2022-04-18 | Discharge: 2022-04-18 | Disposition: A | Discharge status: Home / Self Care | Payer: No Typology Code available for payment source | Source: Ambulatory Visit | Attending: Internal Medicine | Admitting: Internal Medicine

## 2022-04-18 DIAGNOSIS — R102 Pelvic and perineal pain: Secondary | ICD-10-CM

## 2022-04-24 ENCOUNTER — Ambulatory Visit: Payer: No Typology Code available for payment source | Admitting: Internal Medicine

## 2022-05-10 ENCOUNTER — Ambulatory Visit: Payer: No Typology Code available for payment source

## 2022-05-15 ENCOUNTER — Ambulatory Visit: Payer: No Typology Code available for payment source

## 2022-05-17 ENCOUNTER — Ambulatory Visit: Payer: No Typology Code available for payment source

## 2022-05-25 ENCOUNTER — Other Ambulatory Visit: Payer: Self-pay | Admitting: Internal Medicine

## 2022-05-26 NOTE — Telephone Encounter (Signed)
Pt requesting refills for the following meds.  Tiazandidine is pended Last fill 07/15/21 Lorazepam is no longer on the med list but she was previously taking Lorazepam 0.5mg  take 1/2 tab daily PRN.  Last fill 09/23/20

## 2022-05-28 MED ORDER — LORAZEPAM 0.5 MG PO TABS: ORAL_TABLET | ORAL | 0 refills | Status: DC

## 2022-05-28 NOTE — Telephone Encounter (Signed)
Rx sent in for tizanidine and lorazepam.  Given request for lorazepam, please confirm she is doing ok.

## 2022-05-31 ENCOUNTER — Encounter: Payer: Self-pay | Admitting: Internal Medicine

## 2022-05-31 ENCOUNTER — Ambulatory Visit (INDEPENDENT_AMBULATORY_CARE_PROVIDER_SITE_OTHER): Payer: No Typology Code available for payment source | Admitting: Internal Medicine

## 2022-05-31 VITALS — BP 118/70 | HR 80 | Temp 97.9°F | Resp 16 | Ht 59.0 in | Wt 183.4 lb

## 2022-05-31 DIAGNOSIS — E538 Deficiency of other specified B group vitamins: Secondary | ICD-10-CM

## 2022-05-31 DIAGNOSIS — I10 Essential (primary) hypertension: Secondary | ICD-10-CM

## 2022-05-31 DIAGNOSIS — F439 Reaction to severe stress, unspecified: Secondary | ICD-10-CM

## 2022-05-31 DIAGNOSIS — K219 Gastro-esophageal reflux disease without esophagitis: Secondary | ICD-10-CM

## 2022-05-31 DIAGNOSIS — Z7984 Long term (current) use of oral hypoglycemic drugs: Secondary | ICD-10-CM

## 2022-05-31 DIAGNOSIS — E611 Iron deficiency: Secondary | ICD-10-CM

## 2022-05-31 DIAGNOSIS — M5442 Lumbago with sciatica, left side: Secondary | ICD-10-CM

## 2022-05-31 DIAGNOSIS — R5383 Other fatigue: Secondary | ICD-10-CM

## 2022-05-31 DIAGNOSIS — D649 Anemia, unspecified: Secondary | ICD-10-CM

## 2022-05-31 DIAGNOSIS — E1165 Type 2 diabetes mellitus with hyperglycemia: Secondary | ICD-10-CM

## 2022-05-31 DIAGNOSIS — E7800 Pure hypercholesterolemia, unspecified: Secondary | ICD-10-CM

## 2022-05-31 NOTE — Assessment & Plan Note (Signed)
Saw GI previously.  Hemoccult cards negative.  Follow hgb and iron studies.  

## 2022-05-31 NOTE — Assessment & Plan Note (Signed)
Continue amlodipine.  Follow pressures.  Follow metabolic panel.   

## 2022-05-31 NOTE — Assessment & Plan Note (Signed)
Continue prilosec

## 2022-05-31 NOTE — Assessment & Plan Note (Signed)
Continue crestor.  On 10mg  q day now. Low cholesterol diet and exercise.  Follow lipid panel and liver function tests.   Lab Results  Component Value Date   CHOL 207 (H) 03/31/2022   HDL 50.70 03/31/2022   LDLCALC 136 (H) 03/31/2022   LDLDIRECT 159.5 01/16/2013   TRIG 103.0 03/31/2022   CHOLHDL 4 03/31/2022  The 10-year ASCVD risk score (Arnett DK, et al., 2019) is: 12.6%   Values used to calculate the score:     Age: 62 years     Sex: Female     Is Non-Hispanic African American: Yes     Diabetic: Yes     Tobacco smoker: No     Systolic Blood Pressure: 118 mmHg     Is BP treated: Yes     HDL Cholesterol: 50.7 mg/dL     Total Cholesterol: 207 mg/dL

## 2022-05-31 NOTE — Progress Notes (Signed)
Subjective:    Patient ID: Courtney Donovan, female    DOB: January 15, 1960, 62 y.o.   MRN: [id number]  Patient here for  Chief Complaint  Patient presents with  . Medical Management of Chronic Issues    HPI Here to follow up regarding hypercholesterolemia and hypertension.  Has been having pain/aching - low back and legs.  Describes being stiff in the am.  Once moving, feels better.  Had referred her to PT last visit.  She will call me with insurance recommended therapist.  She plans to start pilates. Also describes not sleeping well.  Wakes up - does not feel rested.  Concerned regarding possible sleep apnea.  Breathing stable.  No chest pain reported.  No abdominal pain or bowel change reported.     Past Medical History:  Diagnosis Date  . Allergy   . Anemia   . Chronic headaches   . Diabetes mellitus (HCC)    diet controlled  . Hypercholesterolemia   . Hypertension    Past Surgical History:  Procedure Laterality Date  . ABDOMINAL HYSTERECTOMY  2011   supracervical hysterectomy  . TONSILECTOMY/ADENOIDECTOMY WITH MYRINGOTOMY  1067  . TONSILLECTOMY    . TUBAL LIGATION  1996   Family History  Problem Relation Age of Onset  . Hypertension Mother   . Cancer Father        lung  . Hypertension Father   . Breast cancer Neg Hx    Social History   Socioeconomic History  . Marital status: Married    Spouse name: Not on file  . Number of children: 2  . Years of education: Not on file  . Highest education level: Not on file  Occupational History    Employer: [employer]  Tobacco Use  . Smoking status: Never  . Smokeless tobacco: Never  Vaping Use  . Vaping Use: Never used  Substance and Sexual Activity  . Alcohol use: No    Alcohol/week: 0.0 standard drinks of alcohol  . Drug use: No  . Sexual activity: Yes  Other Topics Concern  . Not on file  Social History Narrative  . Not on file   Social Determinants of Health   Financial Resource Strain: Not on file   Food Insecurity: Not on file  Transportation Needs: Not on file  Physical Activity: Not on file  Stress: Not on file  Social Connections: Not on file     Review of Systems  Constitutional:  Negative for appetite change and unexpected weight change.  HENT:  Negative for congestion and sinus pressure.   Respiratory:  Negative for cough, chest tightness and shortness of breath.   Cardiovascular:  Negative for chest pain and palpitations.  Gastrointestinal:  Negative for abdominal pain, diarrhea, nausea and vomiting.  Genitourinary:  Negative for difficulty urinating and dysuria.  Musculoskeletal:  Positive for back pain.       Low back and leg pain as outlined.  Stiff.   Skin:  Negative for color change and rash.  Neurological:  Negative for dizziness and headaches.  Psychiatric/Behavioral:  Negative for agitation and dysphoric mood.        Objective:     BP 118/70   Pulse 80   Temp 97.9 F (36.6 C)   Resp 16   Ht 4\' 11"  (1.499 m)   Wt 183 lb 6.4 oz (83.2 kg)   SpO2 99%   BMI 37.04 kg/m  Wt Readings from Last 3 Encounters:  05/31/22 183 lb 6.4 oz (  83.2 kg)  03/31/22 179 lb 6.4 oz (81.4 kg)  12/30/21 178 lb 6.4 oz (80.9 kg)    Physical Exam Vitals reviewed.  Constitutional:      General: She is not in acute distress.    Appearance: Normal appearance.  HENT:     Head: Normocephalic and atraumatic.     Right Ear: External ear normal.     Left Ear: External ear normal.  Eyes:     General: No scleral icterus.       Right eye: No discharge.        Left eye: No discharge.     Conjunctiva/sclera: Conjunctivae normal.  Neck:     Thyroid: No thyromegaly.  Cardiovascular:     Rate and Rhythm: Normal rate and regular rhythm.  Pulmonary:     Effort: No respiratory distress.     Breath sounds: Normal breath sounds. No wheezing.  Abdominal:     General: Bowel sounds are normal.     Palpations: Abdomen is soft.     Tenderness: There is no abdominal tenderness.   Musculoskeletal:        General: No swelling or tenderness.     Cervical back: Neck supple. No tenderness.  Lymphadenopathy:     Cervical: No cervical adenopathy.  Skin:    Findings: No erythema or rash.  Neurological:     Mental Status: She is alert.  Psychiatric:        Mood and Affect: Mood normal.        Behavior: Behavior normal.     Outpatient Encounter Medications as of 05/31/2022  Medication Sig  . amLODipine (NORVASC) 5 MG tablet Take 1 tablet (5 mg total) by mouth daily.  Marland Kitchen aspirin 81 MG chewable tablet Chew 81 mg by mouth daily.  Marland Kitchen desonide (DESOWEN) 0.05 % cream Apply topically 2 (two) times daily as needed (Rash). For 2 weeks  . fluticasone (FLONASE) 50 MCG/ACT nasal spray Place 2 sprays into both nostrils daily.  Marland Kitchen LORazepam (ATIVAN) 0.5 MG tablet 1/2 tablet q day prn  . magnesium oxide (MAG-OX) 400 (240 Mg) MG tablet Take 1 tablet (400 mg total) by mouth daily.  . metFORMIN (GLUCOPHAGE-XR) 500 MG 24 hr tablet Take 1 tablet (500 mg total) by mouth daily.  . Multiple Vitamin (MULTIVITAMIN) tablet Take 1 tablet by mouth daily.  Marland Kitchen nystatin cream (MYCOSTATIN) Apply 1 Application topically 2 (two) times daily.  Marland Kitchen omeprazole (PRILOSEC) 40 MG capsule Take 1 capsule (40 mg total) by mouth daily.  . Rimegepant Sulfate (NURTEC) 75 MG TBDP Take 75 mg by mouth daily as needed.  . rosuvastatin (CRESTOR) 10 MG tablet Take 1 tablet (10 mg total) by mouth daily.  Marland Kitchen tiZANidine (ZANAFLEX) 2 MG tablet Take 1 tablet (2 mg total) by mouth 2 (two) times daily as needed for muscle spasms.  . Vitamin D, Ergocalciferol, (DRISDOL) 1.25 MG (50000 UNIT) CAPS capsule Take 1 capsule (50,000 Units total) by mouth every 7 (seven) days.  . [DISCONTINUED] clindamycin (CLEOCIN) 2 % vaginal cream Place 1 Applicatorful vaginally at bedtime.   No facility-administered encounter medications on file as of 05/31/2022.     Lab Results  Component Value Date   WBC 6.1 03/30/2022   HGB 12.9 03/30/2022   HCT  38.8 03/30/2022   PLT 235.0 03/30/2022   GLUCOSE 119 (H) 03/30/2022   CHOL 207 (H) 03/31/2022   TRIG 103.0 03/31/2022   HDL 50.70 03/31/2022   LDLDIRECT 159.5 01/16/2013   LDLCALC 136 (H) 03/31/2022  ALT 9 03/31/2022   AST 12 03/31/2022   NA 140 03/30/2022   K 4.4 03/30/2022   CL 103 03/30/2022   CREATININE 0.89 03/30/2022   BUN 13 03/30/2022   CO2 31 03/30/2022   TSH 2.76 09/07/2021   HGBA1C 6.9 (H) 03/31/2022   MICROALBUR <0.7 11/04/2021    US Pelvic Complete With Transvaginal  Result Date: 04/18/2022 CLINICAL DATA:  Initial evaluation for persistent pelvic pain. EXAM: TRANSABDOMINAL AND TRANSVAGINAL ULTRASOUND OF PELVIS TECHNIQUE: Both transabdominal and transvaginal ultrasound examinations of the pelvis were performed. Transabdominal technique was performed for global imaging of the pelvis including uterus, ovaries, adnexal regions, and pelvic cul-de-sac. It was necessary to proceed with endovaginal exam following the transabdominal exam to visualize the ovaries. COMPARISON:  Prior ultrasound from 01/04/2021. FINDINGS: Uterus Prior hysterectomy.  No abnormality about the vaginal cuff. Endometrium Surgically absent. Right ovary Measurements: 2.6 x 1.4 x 1.5 cm = volume: 2.7 mL. Normal appearance/no adnexal mass. Left ovary Measurements: 1.8 x 1.0 x 1.2 cm = volume: 1.1 mL. Normal appearance/no adnexal mass. Other findings No abnormal free fluid. IMPRESSION: 1. Prior hysterectomy. 2. Normal sonographic appearance of the ovaries. No adnexal mass or free fluid. Electronically Signed   By: Rise Mu M.D.   On: 04/18/2022 18:51       Assessment & Plan:  There are no diagnoses linked to this encounter.   Dale [place], MD

## 2022-05-31 NOTE — Assessment & Plan Note (Signed)
Continue zoloft.  Appears to be handling things relatively well.  Follow.  °

## 2022-05-31 NOTE — Assessment & Plan Note (Signed)
Has a history of anemia.  Follow cbc.  

## 2022-05-31 NOTE — Assessment & Plan Note (Addendum)
Low back pain and pain down left leg as outlined.  Discussed stretches.  Referred to PT for further evaluation and treatment. She will call with preferred therapists.

## 2022-05-31 NOTE — Assessment & Plan Note (Signed)
Discussed.  Feel is multifactorial, but I do feel her untreated sleep apnea is definitely contributing.  Refer to pulmonary for further evaluation - sleep apnea.

## 2022-05-31 NOTE — Assessment & Plan Note (Signed)
Continue B12 supplements.  

## 2022-05-31 NOTE — Assessment & Plan Note (Signed)
Low carb diet and exercise.  On metformin.  Follow met b and a1c.    Lab Results  Component Value Date   HGBA1C 6.9 (H) 03/31/2022   

## 2022-06-01 ENCOUNTER — Encounter: Payer: Self-pay | Admitting: Internal Medicine

## 2022-06-08 ENCOUNTER — Ambulatory Visit: Payer: No Typology Code available for payment source | Admitting: Adult Health

## 2022-06-08 ENCOUNTER — Encounter: Payer: Self-pay | Admitting: Adult Health

## 2022-06-08 VITALS — BP 134/72 | HR 77 | Temp 97.8°F | Ht 59.0 in | Wt 180.0 lb

## 2022-06-08 DIAGNOSIS — R0683 Snoring: Secondary | ICD-10-CM

## 2022-06-08 DIAGNOSIS — G4700 Insomnia, unspecified: Secondary | ICD-10-CM | POA: Insufficient documentation

## 2022-06-08 NOTE — Assessment & Plan Note (Signed)
Snoring, restless sleep, daytime sleepiness all suspicious for underlying sleep apnea.  Patient is occasion given on sleep apnea.  Will set patient up for home sleep study.  - discussed how weight can impact sleep and risk for sleep disordered breathing - discussed options to assist with weight loss: combination of diet modification, cardiovascular and strength training exercises   - had an extensive discussion regarding the adverse health consequences related to untreated sleep disordered breathing - specifically discussed the risks for hypertension, coronary artery disease, cardiac dysrhythmias, cerebrovascular disease, and diabetes - lifestyle modification discussed   - discussed how sleep disruption can increase risk of accidents, particularly when driving - safe driving practices were discussed   Plan  Patient Instructions  Set up for home sleep study.  Work on healthy sleep regimen  Do not drive if sleepy Follow up in 6 weeks to discuss sleep study results and treatment plan

## 2022-06-08 NOTE — Assessment & Plan Note (Signed)
Insomnia-we went over healthy sleep regimen.  Encouraged her on daily activities.  Also on decreasing her screen time.  For now continue on over-the-counter supplements as currently taking.  On return visit once we have her sleep study results can look into other alternatives or sleep aids to help with her sleep pattern  Plan  Patient Instructions  Set up for home sleep study.  Work on healthy sleep regimen  Do not drive if sleepy Follow up in 6 weeks to discuss sleep study results and treatment plan

## 2022-06-08 NOTE — Progress Notes (Signed)
@Patient  ID: Courtney Donovan, female    DOB: 12/17/1960, 62 y.o.   MRN: [id number]  Chief Complaint  Patient presents with   sleep consult    Referring provider: Dale [place], MD  HPI: 62 year old female seen for sleep consult Jun 08, 2022 for snoring, restless sleep, insomnia, daytime sleepiness  TEST/EVENTS :   06/08/2022 Sleep consult  Patient presents for a sleep consult today for snoring, restless sleep, insomnia and daytime sleepiness.  Kindly referred by primary care provider Dr. Lorin Picket.  Patient complains that she never sleeps good always wakes up feeling tired and unrefreshed.  Feels that her sleep is very restless.  She does have associated snoring.  Also feels that she cannot turn her mind off is constantly waking up.  She has been using some over-the-counter sleep aid supplements which helps occasionally.  But does not like to take them all the time.  Typically goes to sleep about 11 to 12 PM.  Reads for a while but takes up to an hour to go to sleep.  And then wakes up multiple times throughout the night.  Gets up about 7 AM.  Weight has been steady over the last couple years.  Current weight is at 180 pounds with a BMI at 36.  She did have a previous sleep study and February 2015 that showed no significant sleep apnea.  Patient says she works from home.  Is on the computer quite a bit.  She does not nap.  Has minimal caffeine intake.  Just says that she feels tired with no energy at all.  Does not take any significant sedating medication.  Patient does have braces.  No symptoms suspicious for cataplexy or sleep paralysis.  Does have a family history of sleep apnea.      06/08/2022   10:00 AM  Results of the Epworth flowsheet  Sitting and reading 0  Watching TV 0  Sitting, inactive in a public place (e.g. a theatre or a meeting) 0  As a passenger in a car for an hour without a break 3  Lying down to rest in the afternoon when circumstances permit 0  Sitting  and talking to someone 0  Sitting quietly after a lunch without alcohol 0  In a car, while stopped for a few minutes in traffic 0  Total score 3     Social history patient is married.  Has children.  Lives at home with her spouse.  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  Does not exercise on a routine basis.  She is a never smoker.  No alcohol or drug use.  Family history is positive for allergies, asthma, cancer, sleep apnea   Allergies  Allergen Reactions   Atorvastatin Other (See Comments)    Other reaction(s): Muscle Pain   Metrogel [Metronidazole]    Pravastatin     Other reaction(s): Muscle Pain CANT TAKE MYALGIAS   Latex Rash    Immunization History  Administered Date(s) Administered   PFIZER(Purple Top)SARS-COV-2 Vaccination 04/18/2019, 05/12/2019, 04/15/2020    Past Medical History:  Diagnosis Date   Allergy    Anemia    Chronic headaches    Diabetes mellitus (HCC)    diet controlled   Hypercholesterolemia    Hypertension     Tobacco History: Social History   Tobacco Use  Smoking Status Never  Smokeless Tobacco Never   Counseling given: Not Answered   Outpatient Medications Prior to Visit  Medication Sig Dispense Refill   amLODipine (NORVASC)  5 MG tablet Take 1 tablet (5 mg total) by mouth daily. 90 tablet 1   aspirin 81 MG chewable tablet Chew 81 mg by mouth daily.     desonide (DESOWEN) 0.05 % cream Apply topically 2 (two) times daily as needed (Rash). For 2 weeks 60 g 1   fluticasone (FLONASE) 50 MCG/ACT nasal spray Place 2 sprays into both nostrils daily. 48 g 3   LORazepam (ATIVAN) 0.5 MG tablet 1/2 tablet q day prn 20 tablet 0   magnesium oxide (MAG-OX) 400 (240 Mg) MG tablet Take 1 tablet (400 mg total) by mouth daily. 30 tablet 2   metFORMIN (GLUCOPHAGE-XR) 500 MG 24 hr tablet Take 1 tablet (500 mg total) by mouth daily. 90 tablet 1   Multiple Vitamin (MULTIVITAMIN) tablet Take 1 tablet by mouth daily.     nystatin cream (MYCOSTATIN) Apply 1  Application topically 2 (two) times daily. 30 g 0   omeprazole (PRILOSEC) 40 MG capsule Take 1 capsule (40 mg total) by mouth daily. 90 capsule 3   Rimegepant Sulfate (NURTEC) 75 MG TBDP Take 75 mg by mouth daily as needed. 10 tablet 0   rosuvastatin (CRESTOR) 10 MG tablet Take 1 tablet (10 mg total) by mouth daily. 10 tablet 0   tiZANidine (ZANAFLEX) 2 MG tablet Take 1 tablet (2 mg total) by mouth 2 (two) times daily as needed for muscle spasms. 20 tablet 0   Vitamin D, Ergocalciferol, (DRISDOL) 1.25 MG (50000 UNIT) CAPS capsule Take 1 capsule (50,000 Units total) by mouth every 7 (seven) days. 12 capsule 0   No facility-administered medications prior to visit.     Review of Systems:   Constitutional:   No  weight loss, night sweats,  Fevers, chills, + fatigue, or  lassitude.  HEENT:   No headaches,  Difficulty swallowing,  Tooth/dental problems, or  Sore throat,                No sneezing, itching, ear ache, nasal congestion, post nasal drip,   CV:  No chest pain,  Orthopnea, PND, swelling in lower extremities, anasarca, dizziness, palpitations, syncope.   GI  No heartburn, indigestion, abdominal pain, nausea, vomiting, diarrhea, change in bowel habits, loss of appetite, bloody stools.   Resp: No shortness of breath with exertion or at rest.  No excess mucus, no productive cough,  No non-productive cough,  No coughing up of blood.  No change in color of mucus.  No wheezing.  No chest wall deformity  Skin: no rash or lesions.  GU: no dysuria, change in color of urine, no urgency or frequency.  No flank pain, no hematuria   MS:  No joint pain or swelling.  No decreased range of motion.  No back pain.    Physical Exam  BP 134/72 (BP Location: Left Arm, Cuff Size: Normal)   Pulse 77   Temp 97.8 F (36.6 C) (Temporal)   Ht 4\' 11"  (1.499 m)   Wt 180 lb (81.6 kg)   SpO2 95%   BMI 36.36 kg/m   GEN: A/Ox3; pleasant , NAD, well nourished    HEENT:  [place]/AT,  NOSE-clear,  THROAT-clear, no lesions, no postnasal drip or exudate noted.  Class III MP airway  NECK:  Supple w/ fair ROM; no JVD; normal carotid impulses w/o bruits; no thyromegaly or nodules palpated; no lymphadenopathy.    RESP  Clear  P & A; w/o, wheezes/ rales/ or rhonchi. no accessory muscle use, no dullness to percussion  CARD:  RRR, no m/r/g, no peripheral edema, pulses intact, no cyanosis or clubbing.  GI:   Soft & nt; nml bowel sounds; no organomegaly or masses detected.   Musco: Warm bil, no deformities or joint swelling noted.   Neuro: alert, no focal deficits noted.    Skin: Warm, no lesions or rashes    Lab Results:  CBC    Component Value Date/Time   WBC 6.1 03/30/2022 0848   RBC 4.44 03/30/2022 0848   HGB 12.9 03/30/2022 0848   HGB 12.0 12/11/2011 2005   HCT 38.8 03/30/2022 0848   HCT 35.7 12/11/2011 2005   PLT 235.0 03/30/2022 0848   PLT 215 12/11/2011 2005   MCV 87.5 03/30/2022 0848   MCV 87 12/11/2011 2005   MCH 29.2 12/11/2011 2005   MCHC 33.3 03/30/2022 0848   RDW 13.8 03/30/2022 0848   RDW 13.5 12/11/2011 2005   LYMPHSABS 2.1 03/30/2022 0848   MONOABS 0.4 03/30/2022 0848   EOSABS 0.1 03/30/2022 0848   BASOSABS 0.0 03/30/2022 0848    BMET    Component Value Date/Time   NA 140 03/30/2022 0848   NA 137 12/11/2011 2005   K 4.4 03/30/2022 0848   K 3.9 12/11/2011 2005   CL 103 03/30/2022 0848   CL 105 12/11/2011 2005   CO2 31 03/30/2022 0848   CO2 28 12/11/2011 2005   GLUCOSE 119 (H) 03/30/2022 0848   GLUCOSE 131 (H) 12/11/2011 2005   BUN 13 03/30/2022 0848   BUN 9 12/11/2011 2005   CREATININE 0.89 03/30/2022 0848   CREATININE 0.74 12/11/2011 2005   CALCIUM 9.6 03/30/2022 0848   CALCIUM 8.3 (L) 12/11/2011 2005   GFRNONAA >60 12/11/2011 2005   GFRAA >60 12/11/2011 2005    BNP No results found for: "BNP"  ProBNP No results found for: "PROBNP"  Imaging: No results found.        No data to display          No results found for:  "NITRICOXIDE"      Assessment & Plan:   Snoring Snoring, restless sleep, daytime sleepiness all suspicious for underlying sleep apnea.  Patient is occasion given on sleep apnea.  Will set patient up for home sleep study.  - discussed how weight can impact sleep and risk for sleep disordered breathing - discussed options to assist with weight loss: combination of diet modification, cardiovascular and strength training exercises   - had an extensive discussion regarding the adverse health consequences related to untreated sleep disordered breathing - specifically discussed the risks for hypertension, coronary artery disease, cardiac dysrhythmias, cerebrovascular disease, and diabetes - lifestyle modification discussed   - discussed how sleep disruption can increase risk of accidents, particularly when driving - safe driving practices were discussed   Plan  Patient Instructions  Set up for home sleep study.  Work on healthy sleep regimen  Do not drive if sleepy Follow up in 6 weeks to discuss sleep study results and treatment plan     Insomnia Insomnia-we went over healthy sleep regimen.  Encouraged her on daily activities.  Also on decreasing her screen time.  For now continue on over-the-counter supplements as currently taking.  On return visit once we have her sleep study results can look into other alternatives or sleep aids to help with her sleep pattern  Plan  Patient Instructions  Set up for home sleep study.  Work on healthy sleep regimen  Do not drive if sleepy Follow up in 6 weeks to  discuss sleep study results and treatment plan       [place], NP 06/08/2022

## 2022-06-08 NOTE — Patient Instructions (Signed)
Set up for home sleep study  Work on healthy sleep regimen  Do not drive if sleepy  Follow up in 6 weeks to discuss sleep study results and treatment plan .  

## 2022-06-08 NOTE — Progress Notes (Signed)
Reviewed and agree with assessment/plan.   Coralyn Helling, MD [facility] Pulmonary/Critical Care 06/08/2022, 1:15 PM Pager:  [phone]

## 2022-06-12 NOTE — Telephone Encounter (Signed)
See if agreeable to schedule appt to discuss starting.  Thanks.

## 2022-06-13 ENCOUNTER — Encounter: Payer: Self-pay | Admitting: Internal Medicine

## 2022-06-13 NOTE — Telephone Encounter (Signed)
Pt scheduled for virtual.  ?

## 2022-06-13 NOTE — Telephone Encounter (Signed)
See other note

## 2022-06-15 ENCOUNTER — Other Ambulatory Visit: Payer: Self-pay | Admitting: Internal Medicine

## 2022-06-16 ENCOUNTER — Encounter: Payer: Self-pay | Admitting: Internal Medicine

## 2022-06-16 ENCOUNTER — Telehealth (INDEPENDENT_AMBULATORY_CARE_PROVIDER_SITE_OTHER): Payer: No Typology Code available for payment source | Admitting: Internal Medicine

## 2022-06-16 DIAGNOSIS — E7800 Pure hypercholesterolemia, unspecified: Secondary | ICD-10-CM

## 2022-06-16 DIAGNOSIS — I10 Essential (primary) hypertension: Secondary | ICD-10-CM

## 2022-06-16 DIAGNOSIS — E1165 Type 2 diabetes mellitus with hyperglycemia: Secondary | ICD-10-CM

## 2022-06-16 MED ORDER — SEMAGLUTIDE(0.25 OR 0.5MG/DOS) 2 MG/3ML ~~LOC~~ SOPN: 0.2500 mg | PEN_INJECTOR | SUBCUTANEOUS | 2 refills | Status: DC

## 2022-06-16 NOTE — Progress Notes (Unsigned)
Patient ID: Courtney Donovan, female   DOB: 16-Aug-1960, 62 y.o.   MRN: [id number]   Virtual Visit via video Note   All issues noted in this document were discussed and addressed.  No physical exam was performed (except for noted visual exam findings with Video Visits).   I connected with Marice Potter by a video enabled telemedicine application and verified that I am speaking with the correct person using two identifiers. Location patient: home Location provider: work Persons participating in the virtual visit: patient, provider  The limitations, risks, security and privacy concerns of performing an evaluation and management service by video and the availability of in person appointments have been discussed.  It has also been discussed with the patient that there may be a patient responsible charge related to this service. The patient expressed understanding and agreed to proceed.    Reason for visit: work in appt  HPI: Work in to discuss the possibility of starting ozempic.  She is diabetic.  Discussed diet and exercise.  Discussed ozempic.  Discussed possible side effects of medication.  She reports no dysphagia or acid reflux.  No abdominal pain or bowel change reported.     ROS: See pertinent positives and negatives per HPI.  Past Medical History:  Diagnosis Date   Allergy    Anemia    Chronic headaches    Diabetes mellitus (HCC)    diet controlled   Hypercholesterolemia    Hypertension     Past Surgical History:  Procedure Laterality Date   ABDOMINAL HYSTERECTOMY  2011   supracervical hysterectomy   TONSILECTOMY/ADENOIDECTOMY WITH MYRINGOTOMY  1067   TONSILLECTOMY     TUBAL LIGATION  1996    Family History  Problem Relation Age of Onset   Hypertension Mother    Cancer Father        lung   Hypertension Father    Breast cancer Neg Hx     SOCIAL HX: reviewed.    Current Outpatient Medications:    Semaglutide,0.25 or 0.5MG /DOS, 2 MG/3ML  SOPN, Inject 0.25 mg into the skin once a week., Disp: 3 mL, Rfl: 2   amLODipine (NORVASC) 5 MG tablet, TAKE 1 TABLET BY MOUTH DAILY, Disp: 90 tablet, Rfl: 3   aspirin 81 MG chewable tablet, Chew 81 mg by mouth daily., Disp: , Rfl:    desonide (DESOWEN) 0.05 % cream, Apply topically 2 (two) times daily as needed (Rash). For 2 weeks, Disp: 60 g, Rfl: 1   fluticasone (FLONASE) 50 MCG/ACT nasal spray, Place 2 sprays into both nostrils daily., Disp: 48 g, Rfl: 3   LORazepam (ATIVAN) 0.5 MG tablet, 1/2 tablet q day prn, Disp: 20 tablet, Rfl: 0   magnesium oxide (MAG-OX) 400 (240 Mg) MG tablet, Take 1 tablet (400 mg total) by mouth daily., Disp: 30 tablet, Rfl: 2   metFORMIN (GLUCOPHAGE-XR) 500 MG 24 hr tablet, Take 1 tablet (500 mg total) by mouth daily., Disp: 90 tablet, Rfl: 1   Multiple Vitamin (MULTIVITAMIN) tablet, Take 1 tablet by mouth daily., Disp: , Rfl:    nystatin cream (MYCOSTATIN), Apply 1 Application topically 2 (two) times daily., Disp: 30 g, Rfl: 0   omeprazole (PRILOSEC) 40 MG capsule, Take 1 capsule (40 mg total) by mouth daily., Disp: 90 capsule, Rfl: 3   Rimegepant Sulfate (NURTEC) 75 MG TBDP, Take 75 mg by mouth daily as needed., Disp: 10 tablet, Rfl: 0   rosuvastatin (CRESTOR) 10 MG tablet, Take 1 tablet (10 mg total) by  mouth daily., Disp: 10 tablet, Rfl: 0   tiZANidine (ZANAFLEX) 2 MG tablet, Take 1 tablet (2 mg total) by mouth 2 (two) times daily as needed for muscle spasms., Disp: 20 tablet, Rfl: 0   Vitamin D, Ergocalciferol, (DRISDOL) 1.25 MG (50000 UNIT) CAPS capsule, Take 1 capsule (50,000 Units total) by mouth every 7 (seven) days., Disp: 12 capsule, Rfl: 0  EXAM:  GENERAL: alert, oriented, appears well and in no acute distress  HEENT: atraumatic, conjunttiva clear, no obvious abnormalities on inspection of external nose and ears  NECK: normal movements of the head and neck  LUNGS: on inspection no signs of respiratory distress, breathing rate appears normal, no  obvious gross SOB, gasping or wheezing  CV: no obvious cyanosis  PSYCH/NEURO: pleasant and cooperative, no obvious depression or anxiety, speech and thought processing grossly intact  ASSESSMENT AND PLAN:  Discussed the following assessment and plan:  Problem List Items Addressed This Visit     Hypertension    Continue amlodipine. Follow pressures.  Follow metabolic panel.       Hypercholesterolemia    Continue crestor.  Low cholesterol diet and exercise.  Follow lipid panel and liver function tests.   Lab Results  Component Value Date   CHOL 207 (H) 03/31/2022   HDL 50.70 03/31/2022   LDLCALC 136 (H) 03/31/2022   LDLDIRECT 159.5 01/16/2013   TRIG 103.0 03/31/2022   CHOLHDL 4 03/31/2022  The 10-year ASCVD risk score (Arnett DK, et al., 2019) is: 18%   Values used to calculate the score:     Age: 53 years     Sex: Female     Is Non-Hispanic African American: Yes     Diabetic: Yes     Tobacco smoker: No     Systolic Blood Pressure: 134 mmHg     Is BP treated: Yes     HDL Cholesterol: 50.7 mg/dL     Total Cholesterol: 207 mg/dL       Diabetes mellitus (HCC) - Primary    Last A1c 6.9.  continue low carb diet and exercise.  Discussed starting ozempic.  Discussed possible side effects.  Will start .25mg  weekly.  Follow sugars.        Relevant Medications   Semaglutide,0.25 or 0.5MG /DOS, 2 MG/3ML SOPN    Return if symptoms worsen or fail to improve.   I discussed the assessment and treatment plan with the patient. The patient was provided an opportunity to ask questions and all were answered. The patient agreed with the plan and demonstrated an understanding of the instructions.   The patient was advised to call back or seek an in-person evaluation if the symptoms worsen or if the condition fails to improve as anticipated.    Dale , MD

## 2022-06-17 ENCOUNTER — Encounter: Payer: Self-pay | Admitting: Internal Medicine

## 2022-06-17 NOTE — Assessment & Plan Note (Signed)
Last A1c 6.9.  continue low carb diet and exercise.  Discussed starting ozempic.  Discussed possible side effects.  Will start .25mg  weekly.  Follow sugars.

## 2022-06-17 NOTE — Assessment & Plan Note (Signed)
Continue crestor.  Low cholesterol diet and exercise.  Follow lipid panel and liver function tests.   Lab Results  Component Value Date   CHOL 207 (H) 03/31/2022   HDL 50.70 03/31/2022   LDLCALC 136 (H) 03/31/2022   LDLDIRECT 159.5 01/16/2013   TRIG 103.0 03/31/2022   CHOLHDL 4 03/31/2022  The 10-year ASCVD risk score (Arnett DK, et al., 2019) is: 18%   Values used to calculate the score:     Age: 62 years     Sex: Female     Is Non-Hispanic African American: Yes     Diabetic: Yes     Tobacco smoker: No     Systolic Blood Pressure: 134 mmHg     Is BP treated: Yes     HDL Cholesterol: 50.7 mg/dL     Total Cholesterol: 207 mg/dL

## 2022-06-17 NOTE — Assessment & Plan Note (Signed)
Continue amlodipine.  Follow pressures.  Follow metabolic panel.   

## 2022-06-20 ENCOUNTER — Other Ambulatory Visit: Payer: Self-pay | Admitting: Internal Medicine

## 2022-06-20 DIAGNOSIS — E7800 Pure hypercholesterolemia, unspecified: Secondary | ICD-10-CM

## 2022-06-20 DIAGNOSIS — E1165 Type 2 diabetes mellitus with hyperglycemia: Secondary | ICD-10-CM

## 2022-06-20 DIAGNOSIS — E559 Vitamin D deficiency, unspecified: Secondary | ICD-10-CM

## 2022-06-22 ENCOUNTER — Encounter: Payer: Self-pay | Admitting: Internal Medicine

## 2022-06-22 ENCOUNTER — Other Ambulatory Visit: Payer: Self-pay | Admitting: Internal Medicine

## 2022-06-22 NOTE — Telephone Encounter (Signed)
Note from 03/31/22 doe snot specify the length  of therapy. Okay to fill vitamin D?

## 2022-06-22 NOTE — Telephone Encounter (Signed)
Rx ok'd for ergocalciferol.  She is currently scheduled for a lab appt in July.  Needs fasting labs prior to her appt and also check 25 hydroxy vitamin D  level with those labs.

## 2022-06-23 ENCOUNTER — Other Ambulatory Visit: Payer: Self-pay

## 2022-06-23 DIAGNOSIS — M5442 Lumbago with sciatica, left side: Secondary | ICD-10-CM

## 2022-06-23 DIAGNOSIS — E1165 Type 2 diabetes mellitus with hyperglycemia: Secondary | ICD-10-CM

## 2022-06-23 MED ORDER — SEMAGLUTIDE(0.25 OR 0.5MG/DOS) 2 MG/3ML ~~LOC~~ SOPN
0.2500 mg | PEN_INJECTOR | SUBCUTANEOUS | 2 refills | Status: DC
Start: 2022-06-23 — End: 2022-11-18

## 2022-06-23 NOTE — Telephone Encounter (Signed)
Noted  

## 2022-06-23 NOTE — Telephone Encounter (Signed)
Labs ordered.

## 2022-06-23 NOTE — Addendum Note (Signed)
Addended by: Rita Ohara D on: 06/23/2022 09:45 AM   Modules accepted: Orders

## 2022-06-23 NOTE — Telephone Encounter (Signed)
Pt called to follow up on PT referral I have placed new referral to [facility] in [place]- pt preference

## 2022-07-26 ENCOUNTER — Encounter (INDEPENDENT_AMBULATORY_CARE_PROVIDER_SITE_OTHER): Payer: No Typology Code available for payment source

## 2022-07-26 DIAGNOSIS — G4733 Obstructive sleep apnea (adult) (pediatric): Secondary | ICD-10-CM

## 2022-07-26 DIAGNOSIS — R0683 Snoring: Secondary | ICD-10-CM

## 2022-08-04 ENCOUNTER — Encounter: Payer: Self-pay | Admitting: Adult Health

## 2022-08-04 ENCOUNTER — Ambulatory Visit (INDEPENDENT_AMBULATORY_CARE_PROVIDER_SITE_OTHER): Payer: No Typology Code available for payment source | Admitting: Adult Health

## 2022-08-04 VITALS — BP 124/80 | HR 71 | Temp 97.1°F | Ht 59.0 in | Wt 177.0 lb

## 2022-08-04 DIAGNOSIS — G4733 Obstructive sleep apnea (adult) (pediatric): Secondary | ICD-10-CM | POA: Diagnosis not present

## 2022-08-04 NOTE — Assessment & Plan Note (Signed)
Mild OSA, discussed sleep study results and treatment options Will proceed with CPAP -auto CPAP 5-15 , try dreamwear nasal mask   Plan  Patient Instructions  Begin CPAP At bedtime , wear all night long for at least 6hrs or more  Try Dream wear nasal mask .  Work on healthy sleep regimen  Do not drive if sleepy Saline nasal spray and gel As needed   Follow up in 3 months and As needed

## 2022-08-04 NOTE — Patient Instructions (Addendum)
Begin CPAP At bedtime , wear all night long for at least 6hrs or more  Try Dream wear nasal mask .  Work on healthy sleep regimen  Do not drive if sleepy Saline nasal spray and gel As needed   Follow up in 3 months and As needed

## 2022-08-04 NOTE — Progress Notes (Signed)
@Patient  ID: Courtney Donovan, female    DOB: Nov 13, 1960, 62 y.o.   MRN: [id number]  Chief Complaint  Patient presents with   Follow-up    Referring provider: Dale Buffalo, MD  HPI: 62 year old female seen for sleep consult Jun 08, 2022 for snoring and daytime sleepiness found to have mild obstructive sleep apnea  TEST/EVENTS :  July 14, 2022 Home sleep study mild sleep apnea with AHI at 9.3 and SpO2 low at 82%  08/04/2022 Follow up: OSA  Patient presents for a follow-up visit.  She was seen in May for a sleep consult for snoring daytime sleepiness.  She was set up for home sleep study that was done earlier this month that showed mild sleep apnea with an AHI at 9.3/hour and SpO2 low at 82%.  We discussed her sleep study results in detail.  Went over treatment options including weight loss, oral appliance and CPAP therapy.  Patient like to proceed with CPAP . Says she sleeps on her stomach and side . Wants a nasal mask .    Allergies  Allergen Reactions   Atorvastatin Other (See Comments)    Other reaction(s): Muscle Pain   Metrogel [Metronidazole]    Pravastatin     Other reaction(s): Muscle Pain CANT TAKE MYALGIAS   Latex Rash    Immunization History  Administered Date(s) Administered   PFIZER(Purple Top)SARS-COV-2 Vaccination 04/18/2019, 05/12/2019, 04/15/2020    Past Medical History:  Diagnosis Date   Allergy    Anemia    Chronic headaches    Diabetes mellitus (HCC)    diet controlled   Hypercholesterolemia    Hypertension     Tobacco History: Social History   Tobacco Use  Smoking Status Never  Smokeless Tobacco Never   Counseling given: Not Answered   Outpatient Medications Prior to Visit  Medication Sig Dispense Refill   amLODipine (NORVASC) 5 MG tablet TAKE 1 TABLET BY MOUTH DAILY 90 tablet 3   aspirin 81 MG chewable tablet Chew 81 mg by mouth daily.     desonide (DESOWEN) 0.05 % cream Apply topically 2 (two) times daily as needed  (Rash). For 2 weeks 60 g 1   fluticasone (FLONASE) 50 MCG/ACT nasal spray Place 2 sprays into both nostrils daily. 48 g 3   LORazepam (ATIVAN) 0.5 MG tablet 1/2 tablet q day prn 20 tablet 0   magnesium oxide (MAG-OX) 400 (240 Mg) MG tablet Take 1 tablet (400 mg total) by mouth daily. 30 tablet 2   metFORMIN (GLUCOPHAGE-XR) 500 MG 24 hr tablet Take 1 tablet (500 mg total) by mouth daily. 90 tablet 1   Multiple Vitamin (MULTIVITAMIN) tablet Take 1 tablet by mouth daily.     nystatin cream (MYCOSTATIN) Apply 1 Application topically 2 (two) times daily. 30 g 0   omeprazole (PRILOSEC) 40 MG capsule Take 1 capsule (40 mg total) by mouth daily. 90 capsule 3   Rimegepant Sulfate (NURTEC) 75 MG TBDP Take 75 mg by mouth daily as needed. 10 tablet 0   rosuvastatin (CRESTOR) 10 MG tablet Take 1 tablet (10 mg total) by mouth daily. 10 tablet 0   Semaglutide,0.25 or 0.5MG /DOS, 2 MG/3ML SOPN Inject 0.25 mg into the skin once a week. 3 mL 2   tiZANidine (ZANAFLEX) 2 MG tablet Take 1 tablet (2 mg total) by mouth 2 (two) times daily as needed for muscle spasms. 20 tablet 0   Vitamin D, Ergocalciferol, (DRISDOL) 1.25 MG (50000 UNIT) CAPS capsule Take 1 capsule by mouth  once a week 12 capsule 0   No facility-administered medications prior to visit.     Review of Systems:   Constitutional:   No  weight loss, night sweats,  Fevers, chills, fatigue, or  lassitude.  HEENT:   No headaches,  Difficulty swallowing,  Tooth/dental problems, or  Sore throat,                No sneezing, itching, ear ache, nasal congestion, post nasal drip,   CV:  No chest pain,  Orthopnea, PND, swelling in lower extremities, anasarca, dizziness, palpitations, syncope.   GI  No heartburn, indigestion, abdominal pain, nausea, vomiting, diarrhea, change in bowel habits, loss of appetite, bloody stools.   Resp: No shortness of breath with exertion or at rest.  No excess mucus, no productive cough,  No non-productive cough,  No coughing up  of blood.  No change in color of mucus.  No wheezing.  No chest wall deformity  Skin: no rash or lesions.  GU: no dysuria, change in color of urine, no urgency or frequency.  No flank pain, no hematuria   MS:  No joint pain or swelling.  No decreased range of motion.  No back pain.    Physical Exam  BP 124/80 (BP Location: Left Arm, Cuff Size: Normal)   Pulse 71   Temp (!) 97.1 F (36.2 C)   Ht 4\' 11"  (1.499 m)   Wt 177 lb (80.3 kg)   SpO2 100%   BMI 35.75 kg/m   GEN: A/Ox3; pleasant , NAD, well nourished    HEENT:  [place]/AT,  NOSE-clear, THROAT-clear, no lesions, no postnasal drip or exudate noted. Class 3 MP airway . Braces   NECK:  Supple w/ fair ROM; no JVD; normal carotid impulses w/o bruits; no thyromegaly or nodules palpated; no lymphadenopathy.    RESP  Clear  P & A; w/o, wheezes/ rales/ or rhonchi. no accessory muscle use, no dullness to percussion  CARD:  RRR, no m/r/g, no peripheral edema, pulses intact, no cyanosis or clubbing.  GI:   Soft & nt; nml bowel sounds; no organomegaly or masses detected.   Musco: Warm bil, no deformities or joint swelling noted.   Neuro: alert, no focal deficits noted.    Skin: Warm, no lesions or rashes    Lab Results:  CBC   BNP No results found for: "BNP"  ProBNP No results found for: "PROBNP"  Imaging: No results found.  Administration History     None           No data to display          No results found for: "NITRICOXIDE"      Assessment & Plan:   OSA (obstructive sleep apnea) Mild OSA, discussed sleep study results and treatment options Will proceed with CPAP -auto CPAP 5-15 , try dreamwear nasal mask   Plan  Patient Instructions  Begin CPAP At bedtime , wear all night long for at least 6hrs or more  Try Dream wear nasal mask .  Work on healthy sleep regimen  Do not drive if sleepy Saline nasal spray and gel As needed   Follow up in 3 months and As needed        Marathon Oil,  NP 08/04/2022

## 2022-08-08 ENCOUNTER — Other Ambulatory Visit (INDEPENDENT_AMBULATORY_CARE_PROVIDER_SITE_OTHER): Payer: No Typology Code available for payment source

## 2022-08-08 DIAGNOSIS — E559 Vitamin D deficiency, unspecified: Secondary | ICD-10-CM | POA: Diagnosis not present

## 2022-08-08 DIAGNOSIS — E1165 Type 2 diabetes mellitus with hyperglycemia: Secondary | ICD-10-CM

## 2022-08-08 DIAGNOSIS — E7800 Pure hypercholesterolemia, unspecified: Secondary | ICD-10-CM | POA: Diagnosis not present

## 2022-08-08 LAB — BASIC METABOLIC PANEL
BUN: 14 mg/dL (ref 6–23)
CO2: 30 mEq/L (ref 19–32)
Calcium: 9.6 mg/dL (ref 8.4–10.5)
Chloride: 101 mEq/L (ref 96–112)
Creatinine, Ser: 0.96 mg/dL (ref 0.40–1.20)
GFR: 64.49 mL/min (ref >60.00)
Glucose, Bld: 123 mg/dL — ABNORMAL HIGH (ref 70–99)
Potassium: 4.1 mEq/L (ref 3.5–5.1)
Sodium: 137 mEq/L (ref 135–145)

## 2022-08-08 LAB — HEPATIC FUNCTION PANEL
ALT: 10 U/L (ref 0–35)
AST: 12 U/L (ref 0–37)
Albumin: 4.5 g/dL (ref 3.5–5.2)
Alkaline Phosphatase: 79 U/L (ref 39–117)
Bilirubin, Direct: 0.1 mg/dL (ref 0.0–0.3)
Total Bilirubin: 0.6 mg/dL (ref 0.2–1.2)
Total Protein: 7 g/dL (ref 6.0–8.3)

## 2022-08-08 LAB — LIPID PANEL
Cholesterol: 160 mg/dL (ref 0–200)
HDL: 49.2 mg/dL (ref >39.00)
LDL Cholesterol: 93 mg/dL (ref 0–99)
NonHDL: 110.69
Total CHOL/HDL Ratio: 3
Triglycerides: 89 mg/dL (ref 0.0–149.0)
VLDL: 17.8 mg/dL (ref 0.0–40.0)

## 2022-08-08 LAB — VITAMIN D 25 HYDROXY (VIT D DEFICIENCY, FRACTURES): VITD: 37.44 ng/mL (ref 30.00–100.00)

## 2022-08-08 LAB — TSH: TSH: 2.23 u[IU]/mL (ref 0.35–5.50)

## 2022-08-08 LAB — HEMOGLOBIN A1C: Hgb A1c MFr Bld: 6.7 % — ABNORMAL HIGH (ref 4.6–6.5)

## 2022-08-08 NOTE — Progress Notes (Signed)
Reviewed and agree with assessment/plan.   Coralyn Helling, MD [facility] Pulmonary/Critical Care 08/08/2022, 10:52 AM Pager:  [phone]

## 2022-08-10 ENCOUNTER — Encounter: Payer: Self-pay | Admitting: Internal Medicine

## 2022-08-10 ENCOUNTER — Ambulatory Visit (INDEPENDENT_AMBULATORY_CARE_PROVIDER_SITE_OTHER): Payer: No Typology Code available for payment source | Admitting: Internal Medicine

## 2022-08-10 VITALS — BP 120/74 | HR 78 | Temp 97.7°F | Ht 59.0 in | Wt 177.6 lb

## 2022-08-10 DIAGNOSIS — E611 Iron deficiency: Secondary | ICD-10-CM

## 2022-08-10 DIAGNOSIS — F439 Reaction to severe stress, unspecified: Secondary | ICD-10-CM

## 2022-08-10 DIAGNOSIS — E1165 Type 2 diabetes mellitus with hyperglycemia: Secondary | ICD-10-CM | POA: Diagnosis not present

## 2022-08-10 DIAGNOSIS — I10 Essential (primary) hypertension: Secondary | ICD-10-CM

## 2022-08-10 DIAGNOSIS — D649 Anemia, unspecified: Secondary | ICD-10-CM

## 2022-08-10 DIAGNOSIS — Z1211 Encounter for screening for malignant neoplasm of colon: Secondary | ICD-10-CM

## 2022-08-10 DIAGNOSIS — K219 Gastro-esophageal reflux disease without esophagitis: Secondary | ICD-10-CM | POA: Diagnosis not present

## 2022-08-10 DIAGNOSIS — G4733 Obstructive sleep apnea (adult) (pediatric): Secondary | ICD-10-CM

## 2022-08-10 DIAGNOSIS — E7800 Pure hypercholesterolemia, unspecified: Secondary | ICD-10-CM

## 2022-08-10 DIAGNOSIS — K5900 Constipation, unspecified: Secondary | ICD-10-CM

## 2022-08-10 MED ORDER — ONDANSETRON HCL 4 MG PO TABS: 4.0000 mg | ORAL_TABLET | Freq: Two times a day (BID) | ORAL | 0 refills | Status: DC | PRN

## 2022-08-10 NOTE — Patient Instructions (Signed)
Benefiber - daily 

## 2022-08-10 NOTE — Progress Notes (Signed)
Subjective:    Patient ID: Courtney Donovan, female    DOB: 1960-10-06, 62 y.o.   MRN: [id number]  Patient here for  Chief Complaint  Patient presents with   Follow-up    Wants to discuss ozempic     HPI Here for follow up - f/u regarding hypercholesterolemia, diabetes and hypertension. Last visit - discussed starting ozempic. Has not started yet.  Did get rx. Discussed today. She is having problems with constipation. Discussed staying hydrated and following for diet triggers.  Benefiber. Started PT today.  No chest pain or sob reported.  No cough or congestion. Saw pulmonary follow up - 08/04/22 - [facility] - mild sleep apnea.  Recommended CPAP. Feels sleeping is more restful.     Past Medical History:  Diagnosis Date   Allergy    Anemia    Chronic headaches    Diabetes mellitus (HCC)    diet controlled   Hypercholesterolemia    Hypertension    Past Surgical History:  Procedure Laterality Date   ABDOMINAL HYSTERECTOMY  2011   supracervical hysterectomy   TONSILECTOMY/ADENOIDECTOMY WITH MYRINGOTOMY  1067   TONSILLECTOMY     TUBAL LIGATION  1996   Family History  Problem Relation Age of Onset   Hypertension Mother    Cancer Father        lung   Hypertension Father    Breast cancer Neg Hx    Social History   Socioeconomic History   Marital status: Married    Spouse name: Not on file   Number of children: 2   Years of education: Not on file   Highest education level: Not on file  Occupational History    Employer: [employer]  Tobacco Use   Smoking status: Never   Smokeless tobacco: Never  Vaping Use   Vaping status: Never Used  Substance and Sexual Activity   Alcohol use: No    Alcohol/week: 0.0 standard drinks of alcohol   Drug use: No   Sexual activity: Yes  Other Topics Concern   Not on file  Social History Narrative   Not on file   Social Determinants of Health   Financial Resource Strain: Not on file  Food Insecurity: Not on file   Transportation Needs: Not on file  Physical Activity: Not on file  Stress: Not on file  Social Connections: Not on file     Review of Systems  Constitutional:  Negative for appetite change and unexpected weight change.  HENT:  Negative for congestion and sinus pressure.   Respiratory:  Negative for cough, chest tightness and shortness of breath.   Cardiovascular:  Negative for chest pain, palpitations and leg swelling.  Gastrointestinal:  Negative for abdominal pain, diarrhea, nausea and vomiting.  Genitourinary:  Negative for difficulty urinating and dysuria.  Musculoskeletal:  Negative for joint swelling and myalgias.  Skin:  Negative for color change and rash.  Neurological:  Negative for dizziness and headaches.  Psychiatric/Behavioral:  Negative for agitation and dysphoric mood.        Objective:     BP 120/74   Pulse 78   Temp 97.7 F (36.5 C) (Oral)   Ht 4\' 11"  (1.499 m)   Wt 177 lb 9.6 oz (80.6 kg)   SpO2 95%   BMI 35.87 kg/m  Wt Readings from Last 3 Encounters:  08/10/22 177 lb 9.6 oz (80.6 kg)  08/04/22 177 lb (80.3 kg)  06/08/22 180 lb (81.6 kg)    Physical Exam Vitals reviewed.  Constitutional:      General: She is not in acute distress.    Appearance: Normal appearance.  HENT:     Head: Normocephalic and atraumatic.     Right Ear: External ear normal.     Left Ear: External ear normal.  Eyes:     General: No scleral icterus.       Right eye: No discharge.        Left eye: No discharge.     Conjunctiva/sclera: Conjunctivae normal.  Neck:     Thyroid: No thyromegaly.  Cardiovascular:     Rate and Rhythm: Normal rate and regular rhythm.  Pulmonary:     Effort: No respiratory distress.     Breath sounds: Normal breath sounds. No wheezing.  Abdominal:     General: Bowel sounds are normal.     Palpations: Abdomen is soft.     Tenderness: There is no abdominal tenderness.  Musculoskeletal:        General: No swelling or tenderness.      Cervical back: Neck supple. No tenderness.  Lymphadenopathy:     Cervical: No cervical adenopathy.  Skin:    Findings: No erythema or rash.  Neurological:     Mental Status: She is alert.  Psychiatric:        Mood and Affect: Mood normal.        Behavior: Behavior normal.      Outpatient Encounter Medications as of 08/10/2022  Medication Sig   amLODipine (NORVASC) 5 MG tablet TAKE 1 TABLET BY MOUTH DAILY   aspirin 81 MG chewable tablet Chew 81 mg by mouth daily.   desonide (DESOWEN) 0.05 % cream Apply topically 2 (two) times daily as needed (Rash). For 2 weeks   fluticasone (FLONASE) 50 MCG/ACT nasal spray Place 2 sprays into both nostrils daily.   LORazepam (ATIVAN) 0.5 MG tablet 1/2 tablet q day prn   magnesium oxide (MAG-OX) 400 (240 Mg) MG tablet Take 1 tablet (400 mg total) by mouth daily.   metFORMIN (GLUCOPHAGE-XR) 500 MG 24 hr tablet Take 1 tablet (500 mg total) by mouth daily.   Multiple Vitamin (MULTIVITAMIN) tablet Take 1 tablet by mouth daily.   nystatin cream (MYCOSTATIN) Apply 1 Application topically 2 (two) times daily.   omeprazole (PRILOSEC) 40 MG capsule Take 1 capsule (40 mg total) by mouth daily.   ondansetron (ZOFRAN) 4 MG tablet Take 1 tablet (4 mg total) by mouth 2 (two) times daily as needed for nausea or vomiting.   Rimegepant Sulfate (NURTEC) 75 MG TBDP Take 75 mg by mouth daily as needed.   rosuvastatin (CRESTOR) 10 MG tablet Take 1 tablet (10 mg total) by mouth daily.   Semaglutide,0.25 or 0.5MG /DOS, 2 MG/3ML SOPN Inject 0.25 mg into the skin once a week.   tiZANidine (ZANAFLEX) 2 MG tablet Take 1 tablet (2 mg total) by mouth 2 (two) times daily as needed for muscle spasms.   Vitamin D, Ergocalciferol, (DRISDOL) 1.25 MG (50000 UNIT) CAPS capsule Take 1 capsule by mouth once a week   No facility-administered encounter medications on file as of 08/10/2022.     Lab Results  Component Value Date   WBC 6.1 03/30/2022   HGB 12.9 03/30/2022   HCT 38.8  03/30/2022   PLT 235.0 03/30/2022   GLUCOSE 123 (H) 08/08/2022   CHOL 160 08/08/2022   TRIG 89.0 08/08/2022   HDL 49.20 08/08/2022   LDLDIRECT 159.5 01/16/2013   LDLCALC 93 08/08/2022   ALT 10 08/08/2022  AST 12 08/08/2022   NA 137 08/08/2022   K 4.1 08/08/2022   CL 101 08/08/2022   CREATININE 0.96 08/08/2022   BUN 14 08/08/2022   CO2 30 08/08/2022   TSH 2.23 08/08/2022   HGBA1C 6.7 (H) 08/08/2022   MICROALBUR <0.7 11/04/2021    US Pelvic Complete With Transvaginal  Result Date: 04/18/2022 CLINICAL DATA:  Initial evaluation for persistent pelvic pain. EXAM: TRANSABDOMINAL AND TRANSVAGINAL ULTRASOUND OF PELVIS TECHNIQUE: Both transabdominal and transvaginal ultrasound examinations of the pelvis were performed. Transabdominal technique was performed for global imaging of the pelvis including uterus, ovaries, adnexal regions, and pelvic cul-de-sac. It was necessary to proceed with endovaginal exam following the transabdominal exam to visualize the ovaries. COMPARISON:  Prior ultrasound from 01/04/2021. FINDINGS: Uterus Prior hysterectomy.  No abnormality about the vaginal cuff. Endometrium Surgically absent. Right ovary Measurements: 2.6 x 1.4 x 1.5 cm = volume: 2.7 mL. Normal appearance/no adnexal mass. Left ovary Measurements: 1.8 x 1.0 x 1.2 cm = volume: 1.1 mL. Normal appearance/no adnexal mass. Other findings No abnormal free fluid. IMPRESSION: 1. Prior hysterectomy. 2. Normal sonographic appearance of the ovaries. No adnexal mass or free fluid. Electronically Signed   By: Rise Mu M.D.   On: 04/18/2022 18:51       Assessment & Plan:  Colon cancer screening -     Ambulatory referral to Gastroenterology  Primary hypertension Assessment & Plan: Continue amlodipine. Follow pressures.  Follow metabolic panel.    Anemia, unspecified type Assessment & Plan: Has a history of anemia.  Follow cbc.    Type 2 diabetes mellitus with hyperglycemia, without long-term current  use of insulin (HCC) Assessment & Plan: Low carb diet and exercise.  Recent A1c - 6.7.  discussed ozempic.  Given increased problems with constipation, will hold on starting ozempic.  Will need to get bowel issues straightened out first.  Follow met b and A1c.    Gastroesophageal reflux disease, unspecified whether esophagitis present Assessment & Plan: Continue prilosec.     Hypercholesterolemia Assessment & Plan: Continue crestor.  Low cholesterol diet and exercise.  Follow lipid panel and liver function tests.   Lab Results  Component Value Date   CHOL 160 08/08/2022   HDL 49.20 08/08/2022   LDLCALC 93 08/08/2022   LDLDIRECT 159.5 01/16/2013   TRIG 89.0 08/08/2022   CHOLHDL 3 08/08/2022  The 10-year ASCVD risk score (Arnett DK, et al., 2019) is: 11.4%   Values used to calculate the score:     Age: 56 years     Sex: Female     Is Non-Hispanic African American: Yes     Diabetic: Yes     Tobacco smoker: No     Systolic Blood Pressure: 120 mmHg     Is BP treated: Yes     HDL Cholesterol: 49.2 mg/dL     Total Cholesterol: 160 mg/dL    Iron deficiency Assessment & Plan: Saw GI previously.  Follow hgb and iron studies.    OSA (obstructive sleep apnea) Assessment & Plan: Continue cpap as directed.  Sleep more restful.  Follow.    Stress Assessment & Plan: Appears to be handling things relatively well.  Follow.    Constipation, unspecified constipation type Assessment & Plan: Constipation as outlined.  Stay hydrated.  Monitor diet.  Benefiber.  Follow.    Other orders -     Ondansetron HCl; Take 1 tablet (4 mg total) by mouth 2 (two) times daily as needed for nausea or vomiting.  Dispense: 14 tablet; Refill: 0     Dale [address]acey Street, MD

## 2022-08-12 ENCOUNTER — Encounter: Payer: Self-pay | Admitting: Internal Medicine

## 2022-08-12 DIAGNOSIS — K5900 Constipation, unspecified: Secondary | ICD-10-CM | POA: Insufficient documentation

## 2022-08-12 NOTE — Assessment & Plan Note (Signed)
Continue amlodipine.  Follow pressures.  Follow metabolic panel.   

## 2022-08-12 NOTE — Assessment & Plan Note (Signed)
Saw GI previously.  Follow hgb and iron studies.

## 2022-08-12 NOTE — Assessment & Plan Note (Signed)
Low carb diet and exercise.  Recent A1c - 6.7.  discussed ozempic.  Given increased problems with constipation, will hold on starting ozempic.  Will need to get bowel issues straightened out first.  Follow met b and A1c.

## 2022-08-12 NOTE — Assessment & Plan Note (Signed)
Continue crestor.  Low cholesterol diet and exercise.  Follow lipid panel and liver function tests.   Lab Results  Component Value Date   CHOL 160 08/08/2022   HDL 49.20 08/08/2022   LDLCALC 93 08/08/2022   LDLDIRECT 159.5 01/16/2013   TRIG 89.0 08/08/2022   CHOLHDL 3 08/08/2022  The 10-year ASCVD risk score (Arnett DK, et al., 2019) is: 11.4%   Values used to calculate the score:     Age: 62 years     Sex: Female     Is Non-Hispanic African American: Yes     Diabetic: Yes     Tobacco smoker: No     Systolic Blood Pressure: 120 mmHg     Is BP treated: Yes     HDL Cholesterol: 49.2 mg/dL     Total Cholesterol: 160 mg/dL

## 2022-08-12 NOTE — Assessment & Plan Note (Signed)
Constipation as outlined.  Stay hydrated.  Monitor diet.  Benefiber.  Follow.

## 2022-08-12 NOTE — Assessment & Plan Note (Signed)
Continue cpap as directed.  Sleep more restful.  Follow.

## 2022-08-12 NOTE — Assessment & Plan Note (Signed)
Has a history of anemia.  Follow cbc.  

## 2022-08-12 NOTE — Assessment & Plan Note (Signed)
Continue prilosec

## 2022-08-12 NOTE — Assessment & Plan Note (Signed)
Appears to be handling things relatively well.  Follow.  

## 2022-08-28 ENCOUNTER — Telehealth: Payer: Self-pay | Admitting: Adult Health

## 2022-08-28 NOTE — Telephone Encounter (Signed)
Pt. Calling wanting to get info about when she is on her cpap of how many episodes she's having to stop breathing and the capap is causing her some headaches

## 2022-08-28 NOTE — Telephone Encounter (Signed)
Left message for Courtney Donovan with [employer] to get a download on her machine.

## 2022-08-28 NOTE — Telephone Encounter (Signed)
I spoke with the patient. She was set up on her CPAP on 08/25/2022. She said she had reached out to [employer] as well. They were suggesting that the face mask might be too tight. They are sending her another mask to try. She will let us know if that does not help with her headaches.  Nothing further needed.

## 2022-09-05 ENCOUNTER — Encounter: Payer: Self-pay | Admitting: Internal Medicine

## 2022-09-05 MED ORDER — SCOPOLAMINE 1 MG/3DAYS TD PT72: 1.0000 | MEDICATED_PATCH | TRANSDERMAL | 0 refills | Status: DC

## 2022-09-05 NOTE — Telephone Encounter (Signed)
Ok to send in scopolamine patches for her cruise?

## 2022-09-05 NOTE — Telephone Encounter (Signed)
Rx for scopolamine patches sent in to pharmacy.

## 2022-10-23 ENCOUNTER — Encounter: Payer: Self-pay | Admitting: Internal Medicine

## 2022-10-23 MED ORDER — OMEPRAZOLE 40 MG PO CPDR: 40.0000 mg | DELAYED_RELEASE_CAPSULE | Freq: Every day | ORAL | 3 refills | Status: DC

## 2022-11-02 ENCOUNTER — Ambulatory Visit: Payer: No Typology Code available for payment source | Admitting: Adult Health

## 2022-11-02 ENCOUNTER — Encounter: Payer: Self-pay | Admitting: Adult Health

## 2022-11-02 VITALS — BP 118/72 | HR 68 | Temp 97.1°F | Ht 59.0 in | Wt 176.2 lb

## 2022-11-02 DIAGNOSIS — G4733 Obstructive sleep apnea (adult) (pediatric): Secondary | ICD-10-CM

## 2022-11-02 NOTE — Assessment & Plan Note (Signed)
Excellent control and compliance on CPAP   Plan  Patient Instructions  Continue CPAP At bedtime , wear all night long for at least 6hrs or more  Work on healthy sleep regimen  Do not drive if sleepy Saline nasal spray and gel As needed   Follow up in  6 months and As needed

## 2022-11-02 NOTE — Progress Notes (Signed)
@Patient  ID: Courtney Donovan, female    DOB: [birth year], 62 y.o.   MRN: [id number]  Chief Complaint  Patient presents with   Follow-up    Referring provider: Dale Buckner, MD  HPI: 62 year old female seen for sleep consult May 2024 for snoring and daytime sleepiness found to have mild obstructive sleep apnea  TEST/EVENTS :  July 14, 2022 Home sleep study mild sleep apnea with AHI at 9.3 and SpO2 low at 82%   11/02/2022 Follow up : OSA Patient presents for a 62-month follow-up.  Patient has mild obstructive sleep apnea.  Seen last visit and started on CPAP therapy.  Patient states she is trying to get used to CPAP.  She tries to wear it every single night.  Does feel that she is benefiting from CPAP.  CPAP download shows 100% compliance.  Daily average usage at 9 hours.  Patient is on auto CPAP 5 to 15 cm H2O.  A AHI is 1.4/hour.  Daily average pressure 11.7 cm H2O. Has tried several different mask including dream wear nasal , pillows , full face . Currently using nasal N20 .    Allergies  Allergen Reactions   Atorvastatin Other (See Comments)    Other reaction(s): Muscle Pain   Metrogel [Metronidazole]    Pravastatin     Other reaction(s): Muscle Pain CANT TAKE MYALGIAS   Latex Rash    Immunization History  Administered Date(s) Administered   PFIZER(Purple Top)SARS-COV-2 Vaccination 04/18/2019, 05/12/2019, 04/15/2020    Past Medical History:  Diagnosis Date   Allergy    Anemia    Chronic headaches    Diabetes mellitus (HCC)    diet controlled   Hypercholesterolemia    Hypertension     Tobacco History: Social History   Tobacco Use  Smoking Status Never  Smokeless Tobacco Never   Counseling given: Not Answered   Outpatient Medications Prior to Visit  Medication Sig Dispense Refill   amLODipine (NORVASC) 5 MG tablet TAKE 1 TABLET BY MOUTH DAILY 90 tablet 3   aspirin 81 MG chewable tablet Chew 81 mg by mouth daily.     desonide (DESOWEN) 0.05  % cream Apply topically 2 (two) times daily as needed (Rash). For 2 weeks 60 g 1   fluticasone (FLONASE) 50 MCG/ACT nasal spray Place 2 sprays into both nostrils daily. 48 g 3   LORazepam (ATIVAN) 0.5 MG tablet 1/2 tablet q day prn 20 tablet 0   magnesium oxide (MAG-OX) 400 (240 Mg) MG tablet Take 1 tablet (400 mg total) by mouth daily. 30 tablet 2   metFORMIN (GLUCOPHAGE-XR) 500 MG 24 hr tablet Take 1 tablet (500 mg total) by mouth daily. 90 tablet 1   Multiple Vitamin (MULTIVITAMIN) tablet Take 1 tablet by mouth daily.     nystatin cream (MYCOSTATIN) Apply 1 Application topically 2 (two) times daily. 30 g 0   omeprazole (PRILOSEC) 40 MG capsule Take 1 capsule (40 mg total) by mouth daily. 90 capsule 3   ondansetron (ZOFRAN) 4 MG tablet Take 1 tablet (4 mg total) by mouth 2 (two) times daily as needed for nausea or vomiting. 14 tablet 0   Rimegepant Sulfate (NURTEC) 75 MG TBDP Take 75 mg by mouth daily as needed. 10 tablet 0   rosuvastatin (CRESTOR) 10 MG tablet Take 1 tablet (10 mg total) by mouth daily. 10 tablet 0   scopolamine (TRANSDERM-SCOP) 1 MG/3DAYS Place 1 patch (1.5 mg total) onto the skin every 3 (three) days. 10 patch 0  Semaglutide,0.25 or 0.5MG /DOS, 2 MG/3ML SOPN Inject 0.25 mg into the skin once a week. 3 mL 2   tiZANidine (ZANAFLEX) 2 MG tablet Take 1 tablet (2 mg total) by mouth 2 (two) times daily as needed for muscle spasms. 20 tablet 0   Vitamin D, Ergocalciferol, (DRISDOL) 1.25 MG (50000 UNIT) CAPS capsule Take 1 capsule by mouth once a week 12 capsule 0   No facility-administered medications prior to visit.     Review of Systems:   Constitutional:   No  weight loss, night sweats,  Fevers, chills, fatigue, or  lassitude.  HEENT:   No headaches,  Difficulty swallowing,  Tooth/dental problems, or  Sore throat,                No sneezing, itching, ear ache, nasal congestion, post nasal drip,   CV:  No chest pain,  Orthopnea, PND, swelling in lower extremities,  anasarca, dizziness, palpitations, syncope.   GI  No heartburn, indigestion, abdominal pain, nausea, vomiting, diarrhea, change in bowel habits, loss of appetite, bloody stools.   Resp: No shortness of breath with exertion or at rest.  No excess mucus, no productive cough,  No non-productive cough,  No coughing up of blood.  No change in color of mucus.  No wheezing.  No chest wall deformity  Skin: no rash or lesions.  GU: no dysuria, change in color of urine, no urgency or frequency.  No flank pain, no hematuria   MS:  No joint pain or swelling.  No decreased range of motion.  No back pain.    Physical Exam  BP 118/72 (BP Location: Right Arm, Cuff Size: Normal)   Pulse 68   Temp (!) 97.1 F (36.2 C)   Ht 4\' 11"  (1.499 m)   Wt 176 lb 3.2 oz (79.9 kg)   SpO2 100%   BMI 35.59 kg/m   GEN: A/Ox3; pleasant , NAD, well nourished    HEENT:  [place]/AT,  NOSE-clear, THROAT-clear, no lesions, no postnasal drip or exudate noted.   NECK:  Supple w/ fair ROM; no JVD; normal carotid impulses w/o bruits; no thyromegaly or nodules palpated; no lymphadenopathy.    RESP  Clear  P & A; w/o, wheezes/ rales/ or rhonchi. no accessory muscle use, no dullness to percussion  CARD:  RRR, no m/r/g, no peripheral edema, pulses intact, no cyanosis or clubbing.  GI:   Soft & nt; nml bowel sounds; no organomegaly or masses detected.   Musco: Warm bil, no deformities or joint swelling noted.   Neuro: alert, no focal deficits noted.    Skin: Warm, no lesions or rashes    Lab Results:  CBC    Component Value Date/Time   WBC 6.1 03/30/2022 0848   RBC 4.44 03/30/2022 0848   HGB 12.9 03/30/2022 0848   HGB 12.0 12/11/2011 2005   HCT 38.8 03/30/2022 0848   HCT 35.7 12/11/2011 2005   PLT 235.0 03/30/2022 0848   PLT 215 12/11/2011 2005   MCV 87.5 03/30/2022 0848   MCV 87 12/11/2011 2005   MCH 29.2 12/11/2011 2005   MCHC 33.3 03/30/2022 0848   RDW 13.8 03/30/2022 0848   RDW 13.5 12/11/2011 2005    LYMPHSABS 2.1 03/30/2022 0848   MONOABS 0.4 03/30/2022 0848   EOSABS 0.1 03/30/2022 0848   BASOSABS 0.0 03/30/2022 0848    BMET    Component Value Date/Time   NA 137 08/08/2022 0802   NA 137 12/11/2011 2005   K 4.1 08/08/2022  0802   K 3.9 12/11/2011 2005   CL 101 08/08/2022 0802   CL 105 12/11/2011 2005   CO2 30 08/08/2022 0802   CO2 28 12/11/2011 2005   GLUCOSE 123 (H) 08/08/2022 0802   GLUCOSE 131 (H) 12/11/2011 2005   BUN 14 08/08/2022 0802   BUN 9 12/11/2011 2005   CREATININE 0.96 08/08/2022 0802   CREATININE 0.74 12/11/2011 2005   CALCIUM 9.6 08/08/2022 0802   CALCIUM 8.3 (L) 12/11/2011 2005   GFRNONAA >60 12/11/2011 2005   GFRAA >60 12/11/2011 2005    BNP No results found for: "BNP"  ProBNP No results found for: "PROBNP"  Imaging: No results found.  Administration History     None           No data to display          No results found for: "NITRICOXIDE"      Assessment & Plan:   No problem-specific Assessment & Plan notes found for this encounter.     Rubye Oaks, NP 11/02/2022

## 2022-11-02 NOTE — Patient Instructions (Addendum)
Continue CPAP At bedtime , wear all night long for at least 6hrs or more  Work on healthy sleep regimen  Do not drive if sleepy Saline nasal spray and gel As needed   Follow up in  6 months and As needed

## 2022-11-06 ENCOUNTER — Telehealth: Payer: Self-pay | Admitting: Internal Medicine

## 2022-11-06 DIAGNOSIS — E7800 Pure hypercholesterolemia, unspecified: Secondary | ICD-10-CM

## 2022-11-06 DIAGNOSIS — E1165 Type 2 diabetes mellitus with hyperglycemia: Secondary | ICD-10-CM

## 2022-11-06 NOTE — Telephone Encounter (Signed)
Patient need lab orders.

## 2022-11-08 ENCOUNTER — Other Ambulatory Visit (INDEPENDENT_AMBULATORY_CARE_PROVIDER_SITE_OTHER): Payer: No Typology Code available for payment source

## 2022-11-08 DIAGNOSIS — E1165 Type 2 diabetes mellitus with hyperglycemia: Secondary | ICD-10-CM | POA: Diagnosis not present

## 2022-11-08 DIAGNOSIS — E7800 Pure hypercholesterolemia, unspecified: Secondary | ICD-10-CM | POA: Diagnosis not present

## 2022-11-08 LAB — HEPATIC FUNCTION PANEL
ALT: 9 U/L (ref 0–35)
AST: 11 U/L (ref 0–37)
Albumin: 4.4 g/dL (ref 3.5–5.2)
Alkaline Phosphatase: 80 U/L (ref 39–117)
Bilirubin, Direct: 0.1 mg/dL (ref 0.0–0.3)
Total Bilirubin: 0.4 mg/dL (ref 0.2–1.2)
Total Protein: 7.3 g/dL (ref 6.0–8.3)

## 2022-11-08 LAB — MICROALBUMIN / CREATININE URINE RATIO
Creatinine,U: 97.4 mg/dL
Microalb Creat Ratio: 0.7 mg/g (ref 0.0–30.0)
Microalb, Ur: <0.7 mg/dL (ref 0.0–1.9)

## 2022-11-08 LAB — HEMOGLOBIN A1C: Hgb A1c MFr Bld: 6.6 % — ABNORMAL HIGH (ref 4.6–6.5)

## 2022-11-08 LAB — BASIC METABOLIC PANEL
BUN: 15 mg/dL (ref 6–23)
CO2: 29 meq/L (ref 19–32)
Calcium: 9.5 mg/dL (ref 8.4–10.5)
Chloride: 104 meq/L (ref 96–112)
Creatinine, Ser: 0.96 mg/dL (ref 0.40–1.20)
GFR: 64.38 mL/min (ref >60.00)
Glucose, Bld: 113 mg/dL — ABNORMAL HIGH (ref 70–99)
Potassium: 3.9 meq/L (ref 3.5–5.1)
Sodium: 140 meq/L (ref 135–145)

## 2022-11-08 LAB — LIPID PANEL
Cholesterol: 157 mg/dL (ref 0–200)
HDL: 45.7 mg/dL (ref >39.00)
LDL Cholesterol: 91 mg/dL (ref 0–99)
NonHDL: 111.19
Total CHOL/HDL Ratio: 3
Triglycerides: 101 mg/dL (ref 0.0–149.0)
VLDL: 20.2 mg/dL (ref 0.0–40.0)

## 2022-11-10 ENCOUNTER — Encounter: Payer: Self-pay | Admitting: Internal Medicine

## 2022-11-10 ENCOUNTER — Ambulatory Visit (INDEPENDENT_AMBULATORY_CARE_PROVIDER_SITE_OTHER): Payer: No Typology Code available for payment source | Admitting: Internal Medicine

## 2022-11-10 VITALS — BP 110/68 | HR 76 | Temp 98.2°F | Resp 16 | Ht 59.0 in | Wt 176.0 lb

## 2022-11-10 DIAGNOSIS — E1165 Type 2 diabetes mellitus with hyperglycemia: Secondary | ICD-10-CM

## 2022-11-10 DIAGNOSIS — F439 Reaction to severe stress, unspecified: Secondary | ICD-10-CM | POA: Diagnosis not present

## 2022-11-10 DIAGNOSIS — I10 Essential (primary) hypertension: Secondary | ICD-10-CM

## 2022-11-10 DIAGNOSIS — G4733 Obstructive sleep apnea (adult) (pediatric): Secondary | ICD-10-CM

## 2022-11-10 DIAGNOSIS — K5900 Constipation, unspecified: Secondary | ICD-10-CM

## 2022-11-10 DIAGNOSIS — D649 Anemia, unspecified: Secondary | ICD-10-CM

## 2022-11-10 DIAGNOSIS — Z7985 Long-term (current) use of injectable non-insulin antidiabetic drugs: Secondary | ICD-10-CM

## 2022-11-10 DIAGNOSIS — E7800 Pure hypercholesterolemia, unspecified: Secondary | ICD-10-CM | POA: Diagnosis not present

## 2022-11-10 DIAGNOSIS — K219 Gastro-esophageal reflux disease without esophagitis: Secondary | ICD-10-CM

## 2022-11-10 NOTE — Progress Notes (Unsigned)
Subjective:    Patient ID: Courtney Donovan, female    DOB: 14-Jul-1960, 62 y.o.   MRN: [id number]  Patient here for  Chief Complaint  Patient presents with   Medical Management of Chronic Issues    HPI Here for follow up - f/u regarding hypercholesterolemia, diabetes and hypertension. Has not started ozempic.  Discussed today.  Discussed possible side effects of GLP 1 agonists.  Specifically discussed constipation.  She is taking clear-lax. This is keeping bowels moving.  No upper symptoms.  No abdominal pain.  No chest pain or sob reported.  No cough or congestion.  Back pain - PT - dong exercise.  Planning to switch pillows. Using cpap.  Followed by pulmonary.     Past Medical History:  Diagnosis Date   Allergy    Anemia    Chronic headaches    Diabetes mellitus (HCC)    diet controlled   Hypercholesterolemia    Hypertension    Past Surgical History:  Procedure Laterality Date   ABDOMINAL HYSTERECTOMY  2011   supracervical hysterectomy   TONSILECTOMY/ADENOIDECTOMY WITH MYRINGOTOMY  1067   TONSILLECTOMY     TUBAL LIGATION  1996   Family History  Problem Relation Age of Onset   Hypertension Mother    Cancer Father        lung   Hypertension Father    Breast cancer Neg Hx    Social History   Socioeconomic History   Marital status: Married    Spouse name: Not on file   Number of children: 2   Years of education: Not on file   Highest education level: Not on file  Occupational History    Employer: [employer]  Tobacco Use   Smoking status: Never   Smokeless tobacco: Never  Vaping Use   Vaping status: Never Used  Substance and Sexual Activity   Alcohol use: No    Alcohol/week: 0.0 standard drinks of alcohol   Drug use: No   Sexual activity: Yes  Other Topics Concern   Not on file  Social History Narrative   Not on file   Social Determinants of Health   Financial Resource Strain: Not on file  Food Insecurity: Not on file  Transportation Needs:  Not on file  Physical Activity: Not on file  Stress: Not on file  Social Connections: Not on file     Review of Systems  Constitutional:  Negative for appetite change and unexpected weight change.  HENT:  Negative for congestion and sinus pressure.   Respiratory:  Negative for cough, chest tightness and shortness of breath.   Cardiovascular:  Negative for chest pain and palpitations.  Gastrointestinal:  Negative for abdominal pain, diarrhea, nausea and vomiting.       Bowels better with current regimen.   Genitourinary:  Negative for difficulty urinating and dysuria.  Musculoskeletal:  Positive for back pain. Negative for joint swelling and myalgias.  Skin:  Negative for color change and rash.  Neurological:  Negative for dizziness and headaches.  Psychiatric/Behavioral:  Negative for agitation and dysphoric mood.        Objective:     BP 110/68   Pulse 76   Temp 98.2 F (36.8 C)   Resp 16   Ht 4\' 11"  (1.499 m)   Wt 176 lb (79.8 kg)   SpO2 99%   BMI 35.55 kg/m  Wt Readings from Last 3 Encounters:  11/10/22 176 lb (79.8 kg)  11/02/22 176 lb 3.2 oz (79.9 kg)  08/10/22 177 lb 9.6 oz (80.6 kg)    Physical Exam Vitals reviewed.  Constitutional:      General: She is not in acute distress.    Appearance: Normal appearance.  HENT:     Head: Normocephalic and atraumatic.     Right Ear: External ear normal.     Left Ear: External ear normal.  Eyes:     General: No scleral icterus.       Right eye: No discharge.        Left eye: No discharge.     Conjunctiva/sclera: Conjunctivae normal.  Neck:     Thyroid: No thyromegaly.  Cardiovascular:     Rate and Rhythm: Normal rate and regular rhythm.  Pulmonary:     Effort: No respiratory distress.     Breath sounds: Normal breath sounds. No wheezing.  Abdominal:     General: Bowel sounds are normal.     Palpations: Abdomen is soft.     Tenderness: There is no abdominal tenderness.  Musculoskeletal:        General: No  swelling or tenderness.     Cervical back: Neck supple. No tenderness.  Lymphadenopathy:     Cervical: No cervical adenopathy.  Skin:    Findings: No erythema or rash.  Neurological:     Mental Status: She is alert.  Psychiatric:        Mood and Affect: Mood normal.        Behavior: Behavior normal.      Outpatient Encounter Medications as of 11/10/2022  Medication Sig   amLODipine (NORVASC) 5 MG tablet TAKE 1 TABLET BY MOUTH DAILY   aspirin 81 MG chewable tablet Chew 81 mg by mouth daily.   desonide (DESOWEN) 0.05 % cream Apply topically 2 (two) times daily as needed (Rash). For 2 weeks   fluticasone (FLONASE) 50 MCG/ACT nasal spray Place 2 sprays into both nostrils daily.   LORazepam (ATIVAN) 0.5 MG tablet 1/2 tablet q day prn   magnesium oxide (MAG-OX) 400 (240 Mg) MG tablet Take 1 tablet (400 mg total) by mouth daily.   metFORMIN (GLUCOPHAGE-XR) 500 MG 24 hr tablet Take 1 tablet (500 mg total) by mouth daily.   Multiple Vitamin (MULTIVITAMIN) tablet Take 1 tablet by mouth daily.   nystatin cream (MYCOSTATIN) Apply 1 Application topically 2 (two) times daily.   omeprazole (PRILOSEC) 40 MG capsule Take 1 capsule (40 mg total) by mouth daily.   ondansetron (ZOFRAN) 4 MG tablet Take 1 tablet (4 mg total) by mouth 2 (two) times daily as needed for nausea or vomiting.   Rimegepant Sulfate (NURTEC) 75 MG TBDP Take 75 mg by mouth daily as needed.   rosuvastatin (CRESTOR) 10 MG tablet Take 1 tablet (10 mg total) by mouth daily.   scopolamine (TRANSDERM-SCOP) 1 MG/3DAYS Place 1 patch (1.5 mg total) onto the skin every 3 (three) days.   Semaglutide,0.25 or 0.5MG /DOS, 2 MG/3ML SOPN Inject 0.25 mg into the skin once a week.   tiZANidine (ZANAFLEX) 2 MG tablet Take 1 tablet (2 mg total) by mouth 2 (two) times daily as needed for muscle spasms.   Vitamin D, Ergocalciferol, (DRISDOL) 1.25 MG (50000 UNIT) CAPS capsule Take 1 capsule by mouth once a week   No facility-administered encounter  medications on file as of 11/10/2022.     Lab Results  Component Value Date   WBC 6.1 03/30/2022   HGB 12.9 03/30/2022   HCT 38.8 03/30/2022   PLT 235.0 03/30/2022   GLUCOSE 113 (  H) 11/08/2022   CHOL 157 11/08/2022   TRIG 101.0 11/08/2022   HDL 45.70 11/08/2022   LDLDIRECT 159.5 01/16/2013   LDLCALC 91 11/08/2022   ALT 9 11/08/2022   AST 11 11/08/2022   NA 140 11/08/2022   K 3.9 11/08/2022   CL 104 11/08/2022   CREATININE 0.96 11/08/2022   BUN 15 11/08/2022   CO2 29 11/08/2022   TSH 2.23 08/08/2022   HGBA1C 6.6 (H) 11/08/2022   MICROALBUR <0.7 11/08/2022    US Pelvic Complete With Transvaginal  Result Date: 04/18/2022 CLINICAL DATA:  Initial evaluation for persistent pelvic pain. EXAM: TRANSABDOMINAL AND TRANSVAGINAL ULTRASOUND OF PELVIS TECHNIQUE: Both transabdominal and transvaginal ultrasound examinations of the pelvis were performed. Transabdominal technique was performed for global imaging of the pelvis including uterus, ovaries, adnexal regions, and pelvic cul-de-sac. It was necessary to proceed with endovaginal exam following the transabdominal exam to visualize the ovaries. COMPARISON:  Prior ultrasound from 01/04/2021. FINDINGS: Uterus Prior hysterectomy.  No abnormality about the vaginal cuff. Endometrium Surgically absent. Right ovary Measurements: 2.6 x 1.4 x 1.5 cm = volume: 2.7 mL. Normal appearance/no adnexal mass. Left ovary Measurements: 1.8 x 1.0 x 1.2 cm = volume: 1.1 mL. Normal appearance/no adnexal mass. Other findings No abnormal free fluid. IMPRESSION: 1. Prior hysterectomy. 2. Normal sonographic appearance of the ovaries. No adnexal mass or free fluid. Electronically Signed   By: Rise Mu M.D.   On: 04/18/2022 18:51       Assessment & Plan:  Stress Assessment & Plan: Appears to be handling things relatively well.  Follow.    OSA (obstructive sleep apnea) Assessment & Plan: Using cpap daily.  Continues follow up with pulmonary.    Primary  hypertension Assessment & Plan: Continue amlodipine. Follow pressures.  Follow metabolic panel.    Hypercholesterolemia Assessment & Plan: Continue crestor.  Low cholesterol diet and exercise.  Follow lipid panel and liver function tests.   Lab Results  Component Value Date   CHOL 157 11/08/2022   HDL 45.70 11/08/2022   LDLCALC 91 11/08/2022   LDLDIRECT 159.5 01/16/2013   TRIG 101.0 11/08/2022   CHOLHDL 3 11/08/2022  The 10-year ASCVD risk score (Arnett DK, et al., 2019) is: 9.2%   Values used to calculate the score:     Age: 33 years     Sex: Female     Is Non-Hispanic African American: Yes     Diabetic: Yes     Tobacco smoker: No     Systolic Blood Pressure: 110 mmHg     Is BP treated: Yes     HDL Cholesterol: 45.7 mg/dL     Total Cholesterol: 157 mg/dL    Gastroesophageal reflux disease, unspecified whether esophagitis present Assessment & Plan: Continue prilosec.     Type 2 diabetes mellitus with hyperglycemia, without long-term current use of insulin (HCC) Assessment & Plan: Low carb diet and exercise.  Recent A1c - 6.7.  discussed ozempic.  Discussed possible side effects. Discussed constipation.  She is keeping her bowels moving with her current regimen. Continue.  Start low dose ozempic and follow.  Follow met b and A1c.    Constipation, unspecified constipation type Assessment & Plan: Bowels doing better with current regimen.  Follow.    Anemia, unspecified type Assessment & Plan: Has a history of anemia.  Follow cbc. Scheduled for colonoscopy.       [place], MD

## 2022-11-11 ENCOUNTER — Encounter: Payer: Self-pay | Admitting: Internal Medicine

## 2022-11-11 NOTE — Assessment & Plan Note (Signed)
Appears to be handling things relatively well.  Follow.  

## 2022-11-11 NOTE — Assessment & Plan Note (Signed)
Low carb diet and exercise.  Recent A1c - 6.7.  discussed ozempic.  Discussed possible side effects. Discussed constipation.  She is keeping her bowels moving with her current regimen. Continue.  Start low dose ozempic and follow.  Follow met b and A1c.

## 2022-11-11 NOTE — Assessment & Plan Note (Signed)
Bowels doing better with current regimen.  Follow.

## 2022-11-11 NOTE — Assessment & Plan Note (Signed)
Using cpap daily.  Continues follow up with pulmonary.

## 2022-11-11 NOTE — Assessment & Plan Note (Signed)
Continue amlodipine.  Follow pressures.  Follow metabolic panel.   

## 2022-11-11 NOTE — Assessment & Plan Note (Signed)
Continue prilosec

## 2022-11-11 NOTE — Assessment & Plan Note (Addendum)
Has a history of anemia.  Follow cbc. Scheduled for colonoscopy.

## 2022-11-11 NOTE — Assessment & Plan Note (Signed)
Continue crestor.  Low cholesterol diet and exercise.  Follow lipid panel and liver function tests.   Lab Results  Component Value Date   CHOL 157 11/08/2022   HDL 45.70 11/08/2022   LDLCALC 91 11/08/2022   LDLDIRECT 159.5 01/16/2013   TRIG 101.0 11/08/2022   CHOLHDL 3 11/08/2022  The 10-year ASCVD risk score (Arnett DK, et al., 2019) is: 9.2%   Values used to calculate the score:     Age: 62 years     Sex: Female     Is Non-Hispanic African American: Yes     Diabetic: Yes     Tobacco smoker: No     Systolic Blood Pressure: 110 mmHg     Is BP treated: Yes     HDL Cholesterol: 45.7 mg/dL     Total Cholesterol: 157 mg/dL

## 2022-11-14 ENCOUNTER — Encounter: Payer: Self-pay | Admitting: Internal Medicine

## 2022-11-15 NOTE — Telephone Encounter (Signed)
Agree with evaluation.  Agree with adding protein as discussed.

## 2022-11-15 NOTE — Telephone Encounter (Signed)
FYI- pulled her sugars. Looks like she has a trend of sugars dropping to 66-69 around midnight-6am. She is eating regular meals but has not been eating after 8 pm. Gave patient suggestions on snacks for bedtime (crackers, spoon of peanut butter, small cup of orange juice, etc.) Patient just would like to be more educated on what to do when her sugar is low before it gets too low. She has been scheduled for a virtual to discuss with you. She felt that this would help ease her mind. She also mentioned doing a diabetes education class so that can be discussed during visit tomorrow as well. Will download her sugars for you tomorrow so you can see her patterns.

## 2022-11-15 NOTE — Telephone Encounter (Signed)
Noted  

## 2022-11-16 ENCOUNTER — Encounter: Payer: Self-pay | Admitting: Internal Medicine

## 2022-11-16 ENCOUNTER — Telehealth: Payer: No Typology Code available for payment source | Admitting: Internal Medicine

## 2022-11-16 DIAGNOSIS — I10 Essential (primary) hypertension: Secondary | ICD-10-CM | POA: Diagnosis not present

## 2022-11-16 DIAGNOSIS — G4733 Obstructive sleep apnea (adult) (pediatric): Secondary | ICD-10-CM | POA: Diagnosis not present

## 2022-11-16 DIAGNOSIS — F439 Reaction to severe stress, unspecified: Secondary | ICD-10-CM | POA: Diagnosis not present

## 2022-11-16 DIAGNOSIS — E1165 Type 2 diabetes mellitus with hyperglycemia: Secondary | ICD-10-CM | POA: Diagnosis not present

## 2022-11-16 NOTE — Progress Notes (Signed)
Patient ID: Courtney Donovan, female   DOB: Jan 01, 1961, 62 y.o.   MRN: [id number]   Virtual Visit via video Note  I connected with Courtney Donovan by a video enabled telemedicine application and verified that I am speaking with the correct person using two identifiers. Location patient: home Location provider: work  Persons participating in the virtual visit: patient, provider  The limitations, risks, security and privacy concerns of performing an evaluation and management service by video and the availability of in person appointments have been dicussed. It has also been discussed with the patient that there may be a patient responsible charge related to this service. The patient expressed understanding and agreed to proceed.   Reason for visit: work in appt  HPI: Work in with concerns regarding episodes of low blood sugars. Reports recently noticing some episodes of the sugars being lower in the evening/night. Eats her evening meal and does not eat after 8pm. Does no eat a large amount of food during the day.  Watches diet. Reviewed her CGM report.  Time active from 11/03/22 - 11/16/22 - 93% with target range 99%, no low sugars and no very low sugars.  High - 1%.  She is not on any medication now for sugar.  Discussed importance of eating regular meals.  Discussed importance of eating protein and not going long periods without eating.  Protein snack prior to bed.    ROS: See pertinent positives and negatives per HPI.  Past Medical History:  Diagnosis Date   Allergy    Anemia    Chronic headaches    Diabetes mellitus (HCC)    diet controlled   Hypercholesterolemia    Hypertension     Past Surgical History:  Procedure Laterality Date   ABDOMINAL HYSTERECTOMY  2011   supracervical hysterectomy   TONSILECTOMY/ADENOIDECTOMY WITH MYRINGOTOMY  1067   TONSILLECTOMY     TUBAL LIGATION  1996    Family History  Problem Relation Age of Onset   Hypertension Mother     Cancer Father        lung   Hypertension Father    Breast cancer Neg Hx     SOCIAL HX: reviewed.    Current Outpatient Medications:    amLODipine (NORVASC) 5 MG tablet, TAKE 1 TABLET BY MOUTH DAILY, Disp: 90 tablet, Rfl: 3   aspirin 81 MG chewable tablet, Chew 81 mg by mouth daily., Disp: , Rfl:    desonide (DESOWEN) 0.05 % cream, Apply topically 2 (two) times daily as needed (Rash). For 2 weeks, Disp: 60 g, Rfl: 1   fluticasone (FLONASE) 50 MCG/ACT nasal spray, Place 2 sprays into both nostrils daily., Disp: 48 g, Rfl: 3   LORazepam (ATIVAN) 0.5 MG tablet, 1/2 tablet q day prn, Disp: 20 tablet, Rfl: 0   magnesium oxide (MAG-OX) 400 (240 Mg) MG tablet, Take 1 tablet (400 mg total) by mouth daily., Disp: 30 tablet, Rfl: 2   Multiple Vitamin (MULTIVITAMIN) tablet, Take 1 tablet by mouth daily., Disp: , Rfl:    nystatin cream (MYCOSTATIN), Apply 1 Application topically 2 (two) times daily., Disp: 30 g, Rfl: 0   omeprazole (PRILOSEC) 40 MG capsule, Take 1 capsule (40 mg total) by mouth daily., Disp: 90 capsule, Rfl: 3   ondansetron (ZOFRAN) 4 MG tablet, Take 1 tablet (4 mg total) by mouth 2 (two) times daily as needed for nausea or vomiting., Disp: 14 tablet, Rfl: 0   Rimegepant Sulfate (NURTEC) 75 MG TBDP, Take 75 mg by  mouth daily as needed., Disp: 10 tablet, Rfl: 0   rosuvastatin (CRESTOR) 10 MG tablet, Take 1 tablet (10 mg total) by mouth daily., Disp: 10 tablet, Rfl: 0   scopolamine (TRANSDERM-SCOP) 1 MG/3DAYS, Place 1 patch (1.5 mg total) onto the skin every 3 (three) days., Disp: 10 patch, Rfl: 0   tiZANidine (ZANAFLEX) 2 MG tablet, Take 1 tablet (2 mg total) by mouth 2 (two) times daily as needed for muscle spasms., Disp: 20 tablet, Rfl: 0   Vitamin D, Ergocalciferol, (DRISDOL) 1.25 MG (50000 UNIT) CAPS capsule, Take 1 capsule by mouth once a week, Disp: 12 capsule, Rfl: 0  EXAM:  GENERAL: alert, oriented, appears well and in no acute distress  HEENT: atraumatic, conjunttiva  clear, no obvious abnormalities on inspection of external nose and ears  NECK: normal movements of the head and neck  LUNGS: on inspection no signs of respiratory distress, breathing rate appears normal, no obvious gross SOB, gasping or wheezing  CV: no obvious cyanosis  PSYCH/NEURO: pleasant and cooperative, no obvious depression or anxiety, speech and thought processing grossly intact  ASSESSMENT AND PLAN:  Discussed the following assessment and plan:  Problem List Items Addressed This Visit     Diabetes mellitus (HCC) - Primary    Has been monitoring diet. On no medication to lower blood sugars.  Discussed importanceof eating regularly and the need to not go long periods without eating.  Follow met b and A1c.       Hypertension    Continue amlodipine. Follow pressures.  Follow metabolic panel.       OSA (obstructive sleep apnea)    CPAP      Stress    Appears to be handling things relatively well.  Follow.        No follow-ups on file.   I discussed the assessment and treatment plan with the patient. The patient was provided an opportunity to ask questions and all were answered. The patient agreed with the plan and demonstrated an understanding of the instructions.   The patient was advised to call back or seek an in-person evaluation if the symptoms worsen or if the condition fails to improve as anticipated.    Dale Rossie, MD

## 2022-11-18 ENCOUNTER — Encounter: Payer: Self-pay | Admitting: Internal Medicine

## 2022-11-18 NOTE — Assessment & Plan Note (Signed)
Continue amlodipine.  Follow pressures.  Follow metabolic panel.   

## 2022-11-18 NOTE — Assessment & Plan Note (Signed)
CPAP.  

## 2022-11-18 NOTE — Assessment & Plan Note (Signed)
Has been monitoring diet. On no medication to lower blood sugars.  Discussed importanceof eating regularly and the need to not go long periods without eating.  Follow met b and A1c.

## 2022-11-18 NOTE — Assessment & Plan Note (Signed)
Appears to be handling things relatively well.  Follow.  

## 2022-11-30 ENCOUNTER — Encounter: Payer: Self-pay | Admitting: Internal Medicine

## 2022-11-30 NOTE — Telephone Encounter (Signed)
 Care team updated and letter sent for eye exam notes.

## 2022-12-29 ENCOUNTER — Other Ambulatory Visit: Payer: Self-pay | Admitting: Internal Medicine

## 2023-01-01 NOTE — Telephone Encounter (Signed)
Please notify her that I am going to hold on refilling the prescription vitamin D.  Given her last vitamin D level had improved and was wnl I want her to hold on prescription vitamin D and have her start vitamin D3 1000 units per day. (This is over the counter)

## 2023-01-02 ENCOUNTER — Other Ambulatory Visit: Payer: Self-pay | Admitting: Internal Medicine

## 2023-01-05 ENCOUNTER — Other Ambulatory Visit: Payer: Self-pay | Admitting: Internal Medicine

## 2023-01-05 NOTE — Telephone Encounter (Signed)
Received request for scopolamine patches.  This is not a continuous medication, so need to call pt and confirm if she needs scopolamine patches.  Is she going on a trip? Also, received request for prescription vitamin D.  Given her last vitamin D level was wnl, if she has completed her prescription vitamin D, then hold on refill and have her start vitamin D3 1000 units per day. (This is over the counter).

## 2023-01-07 NOTE — Telephone Encounter (Signed)
See previous message. Sine her vitamin D level on the last check was wnl, she can stop the prescription vitamin D and start vitamin D3 1000 units per day.

## 2023-01-08 ENCOUNTER — Other Ambulatory Visit: Payer: Self-pay | Admitting: Internal Medicine

## 2023-02-02 ENCOUNTER — Other Ambulatory Visit: Payer: Self-pay | Admitting: Internal Medicine

## 2023-02-02 ENCOUNTER — Telehealth: Payer: Self-pay | Admitting: Internal Medicine

## 2023-02-02 MED ORDER — LORAZEPAM 0.5 MG PO TABS: ORAL_TABLET | ORAL | 0 refills | Status: DC

## 2023-02-02 NOTE — Telephone Encounter (Signed)
Received request for refill lorazepam. PDMP reviewed. Rx initially printed. This was void. Rx redone - sent to pharmacy.

## 2023-02-06 ENCOUNTER — Telehealth: Payer: Self-pay | Admitting: Internal Medicine

## 2023-02-06 DIAGNOSIS — I10 Essential (primary) hypertension: Secondary | ICD-10-CM

## 2023-02-06 DIAGNOSIS — E7800 Pure hypercholesterolemia, unspecified: Secondary | ICD-10-CM

## 2023-02-06 DIAGNOSIS — E1165 Type 2 diabetes mellitus with hyperglycemia: Secondary | ICD-10-CM

## 2023-02-06 NOTE — Telephone Encounter (Signed)
Patient need lab orders.

## 2023-02-07 NOTE — Addendum Note (Signed)
Addended by: Rita Ohara D on: 02/07/2023 09:10 AM   Modules accepted: Orders

## 2023-02-12 ENCOUNTER — Encounter: Payer: Self-pay | Admitting: Internal Medicine

## 2023-02-13 NOTE — Telephone Encounter (Signed)
I can send in prescriptions but just wanted to make sure it was ok with you for me to send in the [facility]?

## 2023-02-13 NOTE — Telephone Encounter (Signed)
 Per last visit, she was concerned regarding low sugars. She has not been on mounjaro  and at the time I saw her, she was off metformin . Given concerns at recent visit and desires to start (or restart) medications, see if agreeable for visit (ok if virtual if needed) - to discuss treatment.

## 2023-02-14 ENCOUNTER — Other Ambulatory Visit (INDEPENDENT_AMBULATORY_CARE_PROVIDER_SITE_OTHER): Payer: No Typology Code available for payment source

## 2023-02-14 DIAGNOSIS — E1165 Type 2 diabetes mellitus with hyperglycemia: Secondary | ICD-10-CM

## 2023-02-14 DIAGNOSIS — E7800 Pure hypercholesterolemia, unspecified: Secondary | ICD-10-CM | POA: Diagnosis not present

## 2023-02-14 DIAGNOSIS — I10 Essential (primary) hypertension: Secondary | ICD-10-CM

## 2023-02-14 LAB — BASIC METABOLIC PANEL
BUN: 17 mg/dL (ref 6–23)
CO2: 30 meq/L (ref 19–32)
Calcium: 9.1 mg/dL (ref 8.4–10.5)
Chloride: 103 meq/L (ref 96–112)
Creatinine, Ser: 0.91 mg/dL (ref 0.40–1.20)
GFR: 68.52 mL/min (ref >60.00)
Glucose, Bld: 114 mg/dL — ABNORMAL HIGH (ref 70–99)
Potassium: 4 meq/L (ref 3.5–5.1)
Sodium: 139 meq/L (ref 135–145)

## 2023-02-14 LAB — CBC WITH DIFFERENTIAL/PLATELET
Basophils Absolute: 0 10*3/uL (ref 0.0–0.1)
Basophils Relative: 0.3 % (ref 0.0–3.0)
Eosinophils Absolute: 0.1 10*3/uL (ref 0.0–0.7)
Eosinophils Relative: 1.6 % (ref 0.0–5.0)
HCT: 37.8 % (ref 36.0–46.0)
Hemoglobin: 12.3 g/dL (ref 12.0–15.0)
Lymphocytes Relative: 33.7 % (ref 12.0–46.0)
Lymphs Abs: 1.9 10*3/uL (ref 0.7–4.0)
MCHC: 32.6 g/dL (ref 30.0–36.0)
MCV: 87.6 fL (ref 78.0–100.0)
Monocytes Absolute: 0.4 10*3/uL (ref 0.1–1.0)
Monocytes Relative: 6.7 % (ref 3.0–12.0)
Neutro Abs: 3.2 10*3/uL (ref 1.4–7.7)
Neutrophils Relative %: 57.7 % (ref 43.0–77.0)
Platelets: 208 10*3/uL (ref 150.0–400.0)
RBC: 4.31 Mil/uL (ref 3.87–5.11)
RDW: 14.5 % (ref 11.5–15.5)
WBC: 5.5 10*3/uL (ref 4.0–10.5)

## 2023-02-14 LAB — LIPID PANEL
Cholesterol: 150 mg/dL (ref 0–200)
HDL: 50.8 mg/dL (ref >39.00)
LDL Cholesterol: 84 mg/dL (ref 0–99)
NonHDL: 99.36
Total CHOL/HDL Ratio: 3
Triglycerides: 78 mg/dL (ref 0.0–149.0)
VLDL: 15.6 mg/dL (ref 0.0–40.0)

## 2023-02-14 LAB — HEPATIC FUNCTION PANEL
ALT: 10 U/L (ref 0–35)
AST: 12 U/L (ref 0–37)
Albumin: 4.4 g/dL (ref 3.5–5.2)
Alkaline Phosphatase: 75 U/L (ref 39–117)
Bilirubin, Direct: 0.1 mg/dL (ref 0.0–0.3)
Total Bilirubin: 0.5 mg/dL (ref 0.2–1.2)
Total Protein: 7.2 g/dL (ref 6.0–8.3)

## 2023-02-14 LAB — HEMOGLOBIN A1C: Hgb A1c MFr Bld: 6.7 % — ABNORMAL HIGH (ref 4.6–6.5)

## 2023-02-16 ENCOUNTER — Ambulatory Visit: Payer: No Typology Code available for payment source | Admitting: Internal Medicine

## 2023-02-21 ENCOUNTER — Encounter: Payer: Self-pay | Admitting: Internal Medicine

## 2023-02-21 ENCOUNTER — Ambulatory Visit (INDEPENDENT_AMBULATORY_CARE_PROVIDER_SITE_OTHER): Payer: No Typology Code available for payment source | Admitting: Internal Medicine

## 2023-02-21 VITALS — BP 128/74 | HR 79 | Temp 98.0°F | Resp 16 | Ht 59.0 in | Wt 177.0 lb

## 2023-02-21 DIAGNOSIS — G4733 Obstructive sleep apnea (adult) (pediatric): Secondary | ICD-10-CM

## 2023-02-21 DIAGNOSIS — I10 Essential (primary) hypertension: Secondary | ICD-10-CM

## 2023-02-21 DIAGNOSIS — E7800 Pure hypercholesterolemia, unspecified: Secondary | ICD-10-CM

## 2023-02-21 DIAGNOSIS — K219 Gastro-esophageal reflux disease without esophagitis: Secondary | ICD-10-CM

## 2023-02-21 DIAGNOSIS — Z7984 Long term (current) use of oral hypoglycemic drugs: Secondary | ICD-10-CM

## 2023-02-21 DIAGNOSIS — F439 Reaction to severe stress, unspecified: Secondary | ICD-10-CM | POA: Diagnosis not present

## 2023-02-21 DIAGNOSIS — E559 Vitamin D deficiency, unspecified: Secondary | ICD-10-CM | POA: Diagnosis not present

## 2023-02-21 DIAGNOSIS — E611 Iron deficiency: Secondary | ICD-10-CM

## 2023-02-21 DIAGNOSIS — E1165 Type 2 diabetes mellitus with hyperglycemia: Secondary | ICD-10-CM

## 2023-02-21 MED ORDER — TIRZEPATIDE 2.5 MG/0.5ML ~~LOC~~ SOAJ: 2.5000 mg | SUBCUTANEOUS | 2 refills | Status: DC

## 2023-02-21 NOTE — Progress Notes (Addendum)
 Subjective:    Patient ID: Courtney Donovan, female    DOB: 1960-12-08, 63 y.o.   MRN: [id number]  Patient here for  Chief Complaint  Patient presents with   Medical Management of Chronic Issues    HPI Here for a scheduled follow up - follow up regarding diabetes, hypercholesterolemia and hypertension. Last visit, was on no medication for her diabetes. Sugars were controlled. She was concerned regarding some low blood sugars. It was decided at that time to remain off medication, given above. Recent A1c 6.7. discussed labs. Discussed treatment options. Discussed diet and exercise. She has started weight watchers - since first of January. Discussed GLP 1 agonist and restarting metformin. Tolerated metformin. She did have some constipation with ozempic. She is drinking water. Stopped soda's. Breathing stable. Does report right foot pain - discussed plantar fasciitis/heel spur. Discussed supports, stretches and avoiding going barefooted. Reviewed CGM data - 02/08/23 - 02/21/23 - 12% active. (States came off her arm). Of the readings 73% target range and 27% low. She reports when she checked with glucometer - low sugars did not correlate with her glucometer checks. Her glucometer readings were higher. No symptoms of low sugars.    Past Medical History:  Diagnosis Date   Allergy    Anemia    Chronic headaches    Diabetes mellitus (HCC)    diet controlled   Hypercholesterolemia    Hypertension    Past Surgical History:  Procedure Laterality Date   ABDOMINAL HYSTERECTOMY  2011   supracervical hysterectomy   TONSILECTOMY/ADENOIDECTOMY WITH MYRINGOTOMY  1067   TONSILLECTOMY     TUBAL LIGATION  1996   Family History  Problem Relation Age of Onset   Hypertension Mother    Cancer Father        lung   Hypertension Father    Breast cancer Neg Hx    Social History   Socioeconomic History   Marital status: Married    Spouse name: Not on file   Number of children: 2   Years  of education: Not on file   Highest education level: Not on file  Occupational History    Employer: [employer]  Tobacco Use   Smoking status: Never   Smokeless tobacco: Never  Vaping Use   Vaping status: Never Used  Substance and Sexual Activity   Alcohol use: No    Alcohol/week: 0.0 standard drinks of alcohol   Drug use: No   Sexual activity: Yes  Other Topics Concern   Not on file  Social History Narrative   Not on file   Social Drivers of Health   Financial Resource Strain: Low Risk  (01/26/2023)   Received from [facility]   Overall Financial Resource Strain (CARDIA)    Difficulty of Paying Living Expenses: Not hard at all  Food Insecurity: No Food Insecurity (01/26/2023)   Received from [facility]   Hunger Vital Sign    Worried About Running Out of Food in the Last Year: Never true    Ran Out of Food in the Last Year: Never true  Transportation Needs: No Transportation Needs (01/26/2023)   Received from [facility]    In the past 12 months, has lack of transportation kept you from medical appointments or from getting medications?: No    Lack of Transportation (Non-Medical): No  Physical Activity: Not on file  Stress: Not on file  Social Connections: Not on file  Review of Systems  Constitutional:  Negative for appetite change and unexpected weight change.  HENT:  Negative for congestion and sinus pressure.   Respiratory:  Negative for cough, chest tightness and shortness of breath.   Cardiovascular:  Negative for chest pain and palpitations.  Gastrointestinal:  Negative for abdominal pain, diarrhea, nausea and vomiting.  Genitourinary:  Negative for difficulty urinating and dysuria.  Musculoskeletal:  Negative for joint swelling and myalgias.       Foot pain as outlined.   Skin:  Negative for color change and rash.  Neurological:  Negative for dizziness and headaches.   Psychiatric/Behavioral:  Negative for agitation and dysphoric mood.        Objective:     BP 128/74   Pulse 79   Temp 98 F (36.7 C)   Resp 16   Ht 4\' 11"  (1.499 m)   Wt 177 lb (80.3 kg)   SpO2 99%   BMI 35.75 kg/m  Wt Readings from Last 3 Encounters:  02/21/23 177 lb (80.3 kg)  11/10/22 176 lb (79.8 kg)  11/02/22 176 lb 3.2 oz (79.9 kg)    Physical Exam Vitals reviewed.  Constitutional:      General: She is not in acute distress.    Appearance: Normal appearance.  HENT:     Head: Normocephalic and atraumatic.     Right Ear: External ear normal.     Left Ear: External ear normal.     Mouth/Throat:     Pharynx: No oropharyngeal exudate or posterior oropharyngeal erythema.  Eyes:     General: No scleral icterus.       Right eye: No discharge.        Left eye: No discharge.     Conjunctiva/sclera: Conjunctivae normal.  Neck:     Thyroid: No thyromegaly.  Cardiovascular:     Rate and Rhythm: Normal rate and regular rhythm.  Pulmonary:     Effort: No respiratory distress.     Breath sounds: Normal breath sounds. No wheezing.  Abdominal:     General: Bowel sounds are normal.     Palpations: Abdomen is soft.     Tenderness: There is no abdominal tenderness.  Musculoskeletal:        General: No swelling or tenderness.     Cervical back: Neck supple. No tenderness.  Lymphadenopathy:     Cervical: No cervical adenopathy.  Skin:    Findings: No erythema or rash.  Neurological:     Mental Status: She is alert.  Psychiatric:        Mood and Affect: Mood normal.        Behavior: Behavior normal.         Outpatient Encounter Medications as of 02/21/2023  Medication Sig   tirzepatide (MOUNJARO) 2.5 MG/0.5ML Pen Inject 2.5 mg into the skin once a week.   amLODipine (NORVASC) 5 MG tablet TAKE 1 TABLET BY MOUTH DAILY   aspirin 81 MG chewable tablet Chew 81 mg by mouth daily.   desonide (DESOWEN) 0.05 % cream Apply topically 2 (two) times daily as needed (Rash).  For 2 weeks   fluticasone (FLONASE) 50 MCG/ACT nasal spray Place 2 sprays into both nostrils daily.   LORazepam (ATIVAN) 0.5 MG tablet 1/2 tablet q day prn   magnesium oxide (MAG-OX) 400 (240 Mg) MG tablet Take 1 tablet (400 mg total) by mouth daily.   Multiple Vitamin (MULTIVITAMIN) tablet Take 1 tablet by mouth daily.   nystatin cream (MYCOSTATIN) Apply 1 Application topically  2 (two) times daily.   omeprazole (PRILOSEC) 40 MG capsule Take 1 capsule (40 mg total) by mouth daily.   ondansetron (ZOFRAN) 4 MG tablet Take 1 tablet (4 mg total) by mouth 2 (two) times daily as needed for nausea or vomiting.   Rimegepant Sulfate (NURTEC) 75 MG TBDP Take 75 mg by mouth daily as needed.   rosuvastatin (CRESTOR) 10 MG tablet TAKE 1 TABLET BY MOUTH DAILY   tiZANidine (ZANAFLEX) 2 MG tablet Take 1 tablet (2 mg total) by mouth 2 (two) times daily as needed for muscle spasms.   Vitamin D, Ergocalciferol, (DRISDOL) 1.25 MG (50000 UNIT) CAPS capsule Take 1 capsule by mouth once a week   [DISCONTINUED] scopolamine (TRANSDERM-SCOP) 1 MG/3DAYS Place 1 patch (1.5 mg total) onto the skin every 3 (three) days.   No facility-administered encounter medications on file as of 02/21/2023.     Lab Results  Component Value Date   WBC 5.5 02/14/2023   HGB 12.3 02/14/2023   HCT 37.8 02/14/2023   PLT 208.0 02/14/2023   GLUCOSE 114 (H) 02/14/2023   CHOL 150 02/14/2023   TRIG 78.0 02/14/2023   HDL 50.80 02/14/2023   LDLDIRECT 159.5 01/16/2013   LDLCALC 84 02/14/2023   ALT 10 02/14/2023   AST 12 02/14/2023   NA 139 02/14/2023   K 4.0 02/14/2023   CL 103 02/14/2023   CREATININE 0.91 02/14/2023   BUN 17 02/14/2023   CO2 30 02/14/2023   TSH 2.23 08/08/2022   HGBA1C 6.7 (H) 02/14/2023   MICROALBUR <0.7 11/08/2022    US Pelvic Complete With Transvaginal Result Date: 04/18/2022 CLINICAL DATA:  Initial evaluation for persistent pelvic pain. EXAM: TRANSABDOMINAL AND TRANSVAGINAL ULTRASOUND OF PELVIS TECHNIQUE: Both  transabdominal and transvaginal ultrasound examinations of the pelvis were performed. Transabdominal technique was performed for global imaging of the pelvis including uterus, ovaries, adnexal regions, and pelvic cul-de-sac. It was necessary to proceed with endovaginal exam following the transabdominal exam to visualize the ovaries. COMPARISON:  Prior ultrasound from 01/04/2021. FINDINGS: Uterus Prior hysterectomy.  No abnormality about the vaginal cuff. Endometrium Surgically absent. Right ovary Measurements: 2.6 x 1.4 x 1.5 cm = volume: 2.7 mL. Normal appearance/no adnexal mass. Left ovary Measurements: 1.8 x 1.0 x 1.2 cm = volume: 1.1 mL. Normal appearance/no adnexal mass. Other findings No abnormal free fluid. IMPRESSION: 1. Prior hysterectomy. 2. Normal sonographic appearance of the ovaries. No adnexal mass or free fluid. Electronically Signed   By: Rise Mu M.D.   On: 04/18/2022 18:51       Assessment & Plan:  Vitamin D deficiency Assessment & Plan: Last vitamin D level 07/2022 - wnl. Recheck with next labs.   Orders: -     VITAMIN D 25 Hydroxy (Vit-D Deficiency, Fractures); Future  Stress Assessment & Plan: Appears to be handling things relatively well.  Follow.    OSA (obstructive sleep apnea) Assessment & Plan: CPAP   Iron deficiency Assessment & Plan: Saw GI previously.  Follow hgb and iron studies. Planning for EGD/colonoscopy 03/2023.    Primary hypertension Assessment & Plan: Continue amlodipine. Follow pressures.  Follow metabolic panel.   Orders: -     Basic metabolic panel; Future  Hypercholesterolemia Assessment & Plan: Continue crestor.  Low cholesterol diet and exercise.  Follow lipid panel and liver function tests.   Lab Results  Component Value Date   CHOL 150 02/14/2023   HDL 50.80 02/14/2023   LDLCALC 84 02/14/2023   LDLDIRECT 159.5 01/16/2013   TRIG 78.0  02/14/2023   CHOLHDL 3 02/14/2023    Orders: -     Hepatic function panel;  Future -     Lipid panel; Future -     TSH; Future  Gastroesophageal reflux disease, unspecified whether esophagitis present Assessment & Plan: Continue prilosec.     Type 2 diabetes mellitus with hyperglycemia, without long-term current use of insulin (HCC) Assessment & Plan: Last visit, she reported sugars were controlled. She was concerned regarding some low blood sugars. It was decided at that time to remain off medication, given above. Recent A1c 6.7. discussed labs. Discussed treatment options. Discussed diet and exercise. She has started weight watchers - since first of January. Discussed GLP 1 agonist and restarting metformin. Tolerated metformin. She did have some constipation with ozempic. She is drinking water. Stopped soda's.will restart metformin XR. Follow sugars. Follow metabolic panel and A1c.   Orders: -     Hemoglobin A1c; Future  Other orders -     Tirzepatide; Inject 2.5 mg into the skin once a week.  Dispense: 2 mL; Refill: 2     Dale [place], MD

## 2023-02-26 ENCOUNTER — Encounter: Payer: Self-pay | Admitting: Internal Medicine

## 2023-02-26 NOTE — Assessment & Plan Note (Signed)
 Appears to be handling things relatively well.  Follow.

## 2023-02-26 NOTE — Assessment & Plan Note (Signed)
 Last visit, she reported sugars were controlled. She was concerned regarding some low blood sugars. It was decided at that time to remain off medication, given above. Recent A1c 6.7. discussed labs. Discussed treatment options. Discussed diet and exercise. She has started weight watchers - since first of January. Discussed GLP 1 agonist and restarting metformin. Tolerated metformin. She did have some constipation with ozempic. She is drinking water. Stopped soda's.will restart metformin XR. Follow sugars. Follow metabolic panel and A1c.

## 2023-02-26 NOTE — Assessment & Plan Note (Signed)
 Continue crestor.  Low cholesterol diet and exercise.  Follow lipid panel and liver function tests.   Lab Results  Component Value Date   CHOL 150 02/14/2023   HDL 50.80 02/14/2023   LDLCALC 84 02/14/2023   LDLDIRECT 159.5 01/16/2013   TRIG 78.0 02/14/2023   CHOLHDL 3 02/14/2023

## 2023-02-26 NOTE — Assessment & Plan Note (Signed)
 Continue prilosec

## 2023-02-26 NOTE — Assessment & Plan Note (Signed)
 CPAP.

## 2023-02-26 NOTE — Assessment & Plan Note (Signed)
 Saw GI previously.  Follow hgb and iron studies. Planning for EGD/colonoscopy 03/2023.

## 2023-02-26 NOTE — Assessment & Plan Note (Signed)
 Last vitamin D level 07/2022 - wnl. Recheck with next labs.

## 2023-02-26 NOTE — Assessment & Plan Note (Signed)
 Continue amlodipine.  Follow pressures.  Follow metabolic panel.

## 2023-02-28 NOTE — Telephone Encounter (Signed)
 Please call her to make sure she understands - if she took the [facility] on Saturday, then I would have her skip this upcoming Saturday and taking the following Wednesday or Thursday (whichever day she wants to start). Let me know if any questions.

## 2023-03-01 NOTE — Telephone Encounter (Signed)
 Patient is aware

## 2023-03-05 ENCOUNTER — Encounter: Payer: Self-pay | Admitting: Internal Medicine

## 2023-03-05 DIAGNOSIS — E1165 Type 2 diabetes mellitus with hyperglycemia: Secondary | ICD-10-CM

## 2023-03-08 ENCOUNTER — Telehealth: Payer: Self-pay

## 2023-03-08 NOTE — Telephone Encounter (Signed)
 I can send in reader. Patient is requesting rx for oxybutynin that she previously had given to her from [facility] for her bladder. She also wants to increase her mounjaro to 5 mg q week

## 2023-03-08 NOTE — Telephone Encounter (Signed)
 I am ok to send in the reader. Regarding the mounjaro, she will need to be off as stated for the procedure. I would like to hold on increasing for one more month - to see how she does with the 2.5mg  dose. Regarding the bladder medication, she did not mention concerns recently. What symptoms is she having?  Also, she mentioned wanting a refill on the oxybutynin or "one with less side effects". Did she have side effects with the oxybutynin?

## 2023-03-08 NOTE — Telephone Encounter (Signed)
 Courtney Donovan

## 2023-03-08 NOTE — Telephone Encounter (Signed)
 Chart reviewed. Originally rx'd 05/25/21 by Richarda Overlie, MD ([facility]). Meds list shows patient reported not taking on 12/05/21. Patient has had no additional appointments since 05/25/21. Pharmacy advised patient needs appointment.

## 2023-03-09 NOTE — Telephone Encounter (Signed)
 Patient ok to stay on the 2.5 mg dose. I will send in another month of mounjaro and her reader. Regarding her bladder, she is having issues with her bladder leaking/urgency and was given oxybutynin by OBGYN in the past. Patient is wondering if she could try the oxybutynin again and see if this helps. She did not have any issues with the oxybutynin

## 2023-03-11 NOTE — Telephone Encounter (Signed)
 I am ok with a trial of oxybutynin if prefers. Another option would be referral to PT for pelvic floor therapy if she would prefer this over medication. Just let me know.

## 2023-03-12 ENCOUNTER — Other Ambulatory Visit: Payer: Self-pay

## 2023-03-12 MED ORDER — FREESTYLE LIBRE 3 READER DEVI: 0 refills | Status: DC

## 2023-03-12 MED ORDER — OXYBUTYNIN CHLORIDE ER 5 MG PO TB24: 5.0000 mg | ORAL_TABLET | Freq: Every day | ORAL | 1 refills | Status: DC

## 2023-03-12 MED ORDER — OMEPRAZOLE 40 MG PO CPDR: 40.0000 mg | DELAYED_RELEASE_CAPSULE | Freq: Every day | ORAL | 3 refills | Status: DC

## 2023-03-12 MED ORDER — GLUCOSE BLOOD VI STRP: ORAL_STRIP | 12 refills | Status: AC

## 2023-03-12 NOTE — Telephone Encounter (Signed)
 Rx sent in for oxybutynin - let me know if any problems.

## 2023-03-12 NOTE — Telephone Encounter (Signed)
 I have sent in reader and test strips per patient request.

## 2023-03-12 NOTE — Addendum Note (Signed)
 Addended by: Charm Barges on: 03/12/2023 02:18 PM   Modules accepted: Orders

## 2023-03-12 NOTE — Telephone Encounter (Signed)
 Patient stated that she would like to try oxybutynin

## 2023-03-19 ENCOUNTER — Ambulatory Visit: Payer: Self-pay

## 2023-03-19 DIAGNOSIS — R1319 Other dysphagia: Secondary | ICD-10-CM | POA: Diagnosis not present

## 2023-03-19 DIAGNOSIS — K219 Gastro-esophageal reflux disease without esophagitis: Secondary | ICD-10-CM | POA: Diagnosis not present

## 2023-03-19 DIAGNOSIS — Z1211 Encounter for screening for malignant neoplasm of colon: Secondary | ICD-10-CM | POA: Diagnosis present

## 2023-03-19 DIAGNOSIS — R0989 Other specified symptoms and signs involving the circulatory and respiratory systems: Secondary | ICD-10-CM | POA: Diagnosis not present

## 2023-03-19 DIAGNOSIS — K222 Esophageal obstruction: Secondary | ICD-10-CM | POA: Diagnosis not present

## 2023-04-02 ENCOUNTER — Ambulatory Visit: Admitting: Sleep Medicine

## 2023-04-02 ENCOUNTER — Encounter: Payer: Self-pay | Admitting: Sleep Medicine

## 2023-04-02 VITALS — BP 120/76 | HR 69 | Temp 98.0°F | Ht 59.0 in | Wt 171.8 lb

## 2023-04-02 DIAGNOSIS — I10 Essential (primary) hypertension: Secondary | ICD-10-CM | POA: Diagnosis not present

## 2023-04-02 DIAGNOSIS — G4733 Obstructive sleep apnea (adult) (pediatric): Secondary | ICD-10-CM | POA: Diagnosis not present

## 2023-04-02 DIAGNOSIS — E66811 Obesity, class 1: Secondary | ICD-10-CM | POA: Diagnosis not present

## 2023-04-02 DIAGNOSIS — E119 Type 2 diabetes mellitus without complications: Secondary | ICD-10-CM

## 2023-04-02 DIAGNOSIS — Z6834 Body mass index (BMI) 34.0-34.9, adult: Secondary | ICD-10-CM

## 2023-04-02 NOTE — Progress Notes (Signed)
 Name:Courtney Donovan MRN: [id number] DOB: 05-13-1960   CHIEF COMPLAINT:  CPAP F/U VISIT   HISTORY OF PRESENT ILLNESS:  [name] is a 63 y.o. w/ a h/o OSA, HTN, DMII, GERD, obesity and hyperlipidemia who presents for CPAP follow up visit. Reports using CPAP therapy every night, which is confirmed by compliance data. She is currently using a nasal cradle mask, which is comfortable. Reports occasional air leaks. Reports mostly feeling refreshed upon awakening with CPAP therapy. Reports nocturnal awakenings due to unclear reasons and has difficulty falling back to sleep. Reports a 10 lb weight loss after starting on Mounjaro. Reports a family history of sleep apnea. Denies drowsy driving. Drinks 1 cup of coffee daily, denies alcohol, tobacco or illicit drug use.   Bedtime 10:30 pm Sleep onset 1 hour Rise time 6:45 am   EPWORTH SLEEP SCORE 3    06/08/2022   10:00 AM  Results of the Epworth flowsheet  Sitting and reading 0  Watching TV 0  Sitting, inactive in a public place (e.g. a theatre or a meeting) 0  As a passenger in a car for an hour without a break 3  Lying down to rest in the afternoon when circumstances permit 0  Sitting and talking to someone 0  Sitting quietly after a lunch without alcohol 0  In a car, while stopped for a few minutes in traffic 0  Total score 3    PAST MEDICAL HISTORY :   has a past medical history of Allergy, Anemia, Chronic headaches, Diabetes mellitus (HCC), Hypercholesterolemia, and Hypertension.  has a past surgical history that includes Abdominal hysterectomy (2011); Tubal ligation (1996); Tonsilectomy/adenoidectomy with myringotomy (1067); and Tonsillectomy. Prior to Admission medications   Medication Sig Start Date End Date Taking? Authorizing Provider  amLODipine (NORVASC) 5 MG tablet TAKE 1 TABLET BY MOUTH DAILY 06/15/22  Yes Dale [place], MD  aspirin 81 MG chewable tablet Chew 81 mg by mouth daily.   Yes [provider]  Continuous Glucose Receiver (FREESTYLE LIBRE 3 READER) DEVI Use as directed to check blood sugars daily. 03/12/23  Yes Dale [place], MD  desonide (DESOWEN) 0.05 % cream Apply topically 2 (two) times daily as needed (Rash). For 2 weeks 06/21/20  Yes Deirdre Evener, MD  fluticasone [facility]) 50 MCG/ACT nasal spray Place 2 sprays into both nostrils daily. 02/29/16  Yes Dale [place], MD  glucose blood test strip Use as instructed to check blood sugars once daily. 03/12/23  Yes Dale [place], MD  LORazepam (ATIVAN) 0.5 MG tablet 1/2 tablet q day prn 02/02/23  Yes Dale [place], MD  magnesium oxide (MAG-OX) 400 (240 Mg) MG tablet Take 1 tablet (400 mg total) by mouth daily. 09/09/21  Yes Dale [place], MD  Multiple Vitamin (MULTIVITAMIN) tablet Take 1 tablet by mouth daily.   Yes [provider]  nystatin cream (MYCOSTATIN) Apply 1 Application topically 2 (two) times daily. 04/12/22  Yes Dale [place], MD  omeprazole (PRILOSEC) 40 MG capsule Take 1 capsule (40 mg total) by mouth daily. 03/12/23  Yes Dale [place], MD  ondansetron (ZOFRAN) 4 MG tablet Take 1 tablet (4 mg total) by mouth 2 (two) times daily as needed for nausea or vomiting. 08/10/22  Yes Dale [place], MD  oxybutynin (DITROPAN-XL) 5 MG 24 hr tablet Take 1 tablet (5 mg total) by mouth at bedtime. 03/12/23  Yes Dale [place], MD  Rimegepant Sulfate (NURTEC) 75 MG TBDP Take 75 mg by mouth daily as needed.  07/15/21  Yes Dale [place], MD  rosuvastatin (CRESTOR) 10 MG tablet TAKE 1 TABLET BY MOUTH DAILY 01/09/23  Yes Dale [place], MD  tirzepatide [facility]) 2.5 MG/0.5ML Pen Inject 2.5 mg into the skin once a week. 02/21/23  Yes Dale [place], MD  tiZANidine (ZANAFLEX) 2 MG tablet Take 1 tablet (2 mg total) by mouth 2 (two) times daily as needed for muscle spasms. 05/28/22  Yes Dale [place], MD  Vitamin D, Ergocalciferol, (DRISDOL) 1.25 MG (50000 UNIT) CAPS capsule Take 1 capsule by mouth once a week 06/22/22   Yes Dale [place], MD   Allergies  Allergen Reactions   Atorvastatin Other (See Comments)    Other reaction(s): Muscle Pain   Metrogel [Metronidazole]    Pravastatin     Other reaction(s): Muscle Pain CANT TAKE MYALGIAS   Latex Rash    FAMILY HISTORY:  family history includes Cancer in her father; Hypertension in her father and mother. SOCIAL HISTORY:  reports that she has never smoked. She has never used smokeless tobacco. She reports that she does not drink alcohol and does not use drugs.   Review of Systems:  Gen:  Denies  fever, sweats, chills weight loss  HEENT: Denies blurred vision, double vision, ear pain, eye pain, hearing loss, nose bleeds, sore throat Cardiac:  No dizziness, chest pain or heaviness, chest tightness,edema, No JVD Resp:   No cough, -sputum production, -shortness of breath,-wheezing, -hemoptysis,  Gi: Denies swallowing difficulty, stomach pain, nausea or vomiting, diarrhea, constipation, bowel incontinence Gu:  Denies bladder incontinence, burning urine Ext:   Denies Joint pain, stiffness or swelling Skin: Denies  skin rash, easy bruising or bleeding or hives Endoc:  Denies polyuria, polydipsia , polyphagia or weight change Psych:   Denies depression, insomnia or hallucinations  Other:  All other systems negative  VITAL SIGNS: BP 120/76 (BP Location: Right Arm, Patient Position: Sitting, Cuff Size: Normal)   Pulse 69   Temp 98 F (36.7 C) (Temporal)   Ht 4\' 11"  (1.499 m)   Wt 171 lb 12.8 oz (77.9 kg)   SpO2 100%   BMI 34.70 kg/m    Physical Examination:   General Appearance: No distress  EYES PERRLA, EOM intact.   NECK Supple, No JVD Mallampati III Pulmonary: normal breath sounds, No wheezing.  CardiovascularNormal S1,S2.  No m/r/g.   Abdomen: Benign, Soft, non-tender. Skin:   warm, no rashes, no ecchymosis  Extremities: normal, no cyanosis, clubbing. Neuro:without focal findings,  speech normal  PSYCHIATRIC: Mood, affect within  normal limits.   ASSESSMENT AND PLAN  OSA Patient is using and benefiting from CPAP therapy. Order for new mask and supplies completed. Discussed the consequences of untreated sleep apnea. Advised not to drive drowsy for safety of patient and others. Will follow up in 1 year to review CPAP efficacy and compliance data.    HTN Stable, on current management. Following with PCP.   DMII Stable, on current management.   Obesity Counseled patient on diet and lifestyle modification.    Patient  satisfied with Plan of action and management. All questions answered  CPAP follow up visit in 1 year.     I spent a total of 30 minutes reviewing chart data, face-to-face evaluation with the patient, counseling and coordination of care as detailed above.    Tempie Hoist, M.D.  Sleep Medicine [facility]

## 2023-04-03 MED ORDER — TIRZEPATIDE 5 MG/0.5ML ~~LOC~~ SOAJ: 5.0000 mg | SUBCUTANEOUS | 0 refills | Status: DC

## 2023-04-03 NOTE — Telephone Encounter (Signed)
 Are you ok with increasing her mounjaro to 5 mg?

## 2023-04-03 NOTE — Addendum Note (Signed)
 Addended by: Rita Ohara D on: 04/03/2023 04:06 PM   Modules accepted: Orders

## 2023-04-03 NOTE — Telephone Encounter (Signed)
 5 MG DOSE SENT IN

## 2023-04-03 NOTE — Telephone Encounter (Signed)
 If tolerating, ok to increase to 5mg 

## 2023-04-13 MED ORDER — DEXCOM G7 SENSOR MISC: 6 refills | Status: DC

## 2023-04-13 NOTE — Telephone Encounter (Signed)
 I am ok with changing to dexcom. Let me know if I need to do anything more.

## 2023-04-13 NOTE — Telephone Encounter (Signed)
 G7 sensors sent in. Nothing further needed at this time,.

## 2023-04-13 NOTE — Addendum Note (Signed)
 Addended by: Rita Ohara D on: 04/13/2023 01:39 PM   Modules accepted: Orders

## 2023-04-24 NOTE — Telephone Encounter (Signed)
 She has diabetes with varying blood sugars and elevated blood sugars. Needs CGM to help with better blood sugar control.

## 2023-04-24 NOTE — Telephone Encounter (Signed)
 Pt is not insulin dependent, most insurances require that pt be insulin dependent and requiring close glucose monitoring to adjust insulin doses, please provide justification as to why CGM is medically necessary for PA, thank you.

## 2023-04-25 ENCOUNTER — Other Ambulatory Visit (HOSPITAL_COMMUNITY): Payer: Self-pay

## 2023-04-25 ENCOUNTER — Telehealth: Payer: Self-pay

## 2023-04-25 NOTE — Telephone Encounter (Signed)
 Pharmacy Patient Advocate Encounter   Received notification from Patient Advice Request messages that prior authorization for Dexcom G7 Sensor is required/requested.   Insurance verification completed.   The patient is insured through [employer] .   Per test claim: PA required; PA submitted to above mentioned insurance via CoverMyMeds Key/confirmation #/EOC [id number] Status is pending

## 2023-04-27 NOTE — Telephone Encounter (Signed)
 Pharmacy Patient Advocate Encounter  Received notification from [employer] that Prior Authorization for  Dexcom G7 Sensor  has been DENIED.  Full denial letter will be uploaded to the media tab. See denial reason below.   PA #/Case ID/Reference #: [id number]

## 2023-04-30 NOTE — Telephone Encounter (Signed)
 See attached. I reviewed her sugars and they are ok. Let me know if any problems with low sugars, etc.  She is also requesting a copy of the denial letter from the insurance company.

## 2023-04-30 NOTE — Telephone Encounter (Signed)
 SEE MY CHART MESSAGE.

## 2023-04-30 NOTE — Telephone Encounter (Signed)
 If she is having frequent low sugars, I need her to check her sugar with a regular glucometer and see if she is registering low sugars. Let us  know what they are running. May need to adjust diet/meds.

## 2023-05-03 ENCOUNTER — Ambulatory Visit: Admitting: Sleep Medicine

## 2023-05-29 ENCOUNTER — Other Ambulatory Visit: Payer: Self-pay | Admitting: Internal Medicine

## 2023-05-29 DIAGNOSIS — Z1231 Encounter for screening mammogram for malignant neoplasm of breast: Secondary | ICD-10-CM

## 2023-05-31 ENCOUNTER — Ambulatory Visit: Payer: Self-pay | Admitting: Internal Medicine

## 2023-05-31 ENCOUNTER — Other Ambulatory Visit (INDEPENDENT_AMBULATORY_CARE_PROVIDER_SITE_OTHER): Payer: No Typology Code available for payment source

## 2023-05-31 DIAGNOSIS — E559 Vitamin D deficiency, unspecified: Secondary | ICD-10-CM

## 2023-05-31 DIAGNOSIS — I10 Essential (primary) hypertension: Secondary | ICD-10-CM | POA: Diagnosis not present

## 2023-05-31 DIAGNOSIS — E7800 Pure hypercholesterolemia, unspecified: Secondary | ICD-10-CM | POA: Diagnosis not present

## 2023-05-31 DIAGNOSIS — E1165 Type 2 diabetes mellitus with hyperglycemia: Secondary | ICD-10-CM

## 2023-05-31 LAB — HEPATIC FUNCTION PANEL
ALT: 13 U/L (ref 0–35)
AST: 13 U/L (ref 0–37)
Albumin: 4.4 g/dL (ref 3.5–5.2)
Alkaline Phosphatase: 78 U/L (ref 39–117)
Bilirubin, Direct: 0 mg/dL (ref 0.0–0.3)
Total Bilirubin: 0.4 mg/dL (ref 0.2–1.2)
Total Protein: 6.9 g/dL (ref 6.0–8.3)

## 2023-05-31 LAB — HEMOGLOBIN A1C: Hgb A1c MFr Bld: 6.1 % (ref 4.6–6.5)

## 2023-05-31 LAB — BASIC METABOLIC PANEL WITH GFR
BUN: 17 mg/dL (ref 6–23)
CO2: 31 meq/L (ref 19–32)
Calcium: 9.3 mg/dL (ref 8.4–10.5)
Chloride: 102 meq/L (ref 96–112)
Creatinine, Ser: 0.82 mg/dL (ref 0.40–1.20)
GFR: 77.48 mL/min (ref >60.00)
Glucose, Bld: 89 mg/dL (ref 70–99)
Potassium: 4.1 meq/L (ref 3.5–5.1)
Sodium: 140 meq/L (ref 135–145)

## 2023-05-31 LAB — LIPID PANEL
Cholesterol: 178 mg/dL (ref 0–200)
HDL: 42.8 mg/dL (ref >39.00)
LDL Cholesterol: 120 mg/dL — ABNORMAL HIGH (ref 0–99)
NonHDL: 135.37
Total CHOL/HDL Ratio: 4
Triglycerides: 79 mg/dL (ref 0.0–149.0)
VLDL: 15.8 mg/dL (ref 0.0–40.0)

## 2023-05-31 LAB — TSH: TSH: 1.83 u[IU]/mL (ref 0.35–5.50)

## 2023-05-31 LAB — VITAMIN D 25 HYDROXY (VIT D DEFICIENCY, FRACTURES): VITD: 23.37 ng/mL — ABNORMAL LOW (ref 30.00–100.00)

## 2023-06-05 ENCOUNTER — Encounter: Payer: Self-pay | Admitting: Internal Medicine

## 2023-06-05 ENCOUNTER — Ambulatory Visit (INDEPENDENT_AMBULATORY_CARE_PROVIDER_SITE_OTHER)

## 2023-06-05 ENCOUNTER — Ambulatory Visit: Payer: No Typology Code available for payment source | Admitting: Internal Medicine

## 2023-06-05 VITALS — BP 118/70 | HR 71 | Temp 97.9°F | Resp 16 | Ht 59.0 in | Wt 160.0 lb

## 2023-06-05 DIAGNOSIS — M25511 Pain in right shoulder: Secondary | ICD-10-CM | POA: Diagnosis not present

## 2023-06-05 DIAGNOSIS — K219 Gastro-esophageal reflux disease without esophagitis: Secondary | ICD-10-CM

## 2023-06-05 DIAGNOSIS — E538 Deficiency of other specified B group vitamins: Secondary | ICD-10-CM

## 2023-06-05 DIAGNOSIS — E7800 Pure hypercholesterolemia, unspecified: Secondary | ICD-10-CM

## 2023-06-05 DIAGNOSIS — Z0000 Encounter for general adult medical examination without abnormal findings: Secondary | ICD-10-CM

## 2023-06-05 DIAGNOSIS — G4733 Obstructive sleep apnea (adult) (pediatric): Secondary | ICD-10-CM

## 2023-06-05 DIAGNOSIS — Z794 Long term (current) use of insulin: Secondary | ICD-10-CM

## 2023-06-05 DIAGNOSIS — E559 Vitamin D deficiency, unspecified: Secondary | ICD-10-CM

## 2023-06-05 DIAGNOSIS — I10 Essential (primary) hypertension: Secondary | ICD-10-CM

## 2023-06-05 DIAGNOSIS — E1165 Type 2 diabetes mellitus with hyperglycemia: Secondary | ICD-10-CM

## 2023-06-05 LAB — FOLLICLE STIMULATING HORMONE: FSH: 30.7 m[IU]/mL

## 2023-06-05 LAB — VITAMIN B12: Vitamin B-12: 563 pg/mL (ref 211–911)

## 2023-06-05 MED ORDER — OMEPRAZOLE 20 MG PO CPDR: 20.0000 mg | DELAYED_RELEASE_CAPSULE | Freq: Every day | ORAL | 1 refills | Status: DC

## 2023-06-05 MED ORDER — VITAMIN D (ERGOCALCIFEROL) 1.25 MG (50000 UNIT) PO CAPS: 50000.0000 [IU] | ORAL_CAPSULE | ORAL | 0 refills | Status: DC

## 2023-06-05 NOTE — Assessment & Plan Note (Addendum)
 Physical today 06/05/23.   Mammogram 04/14/22 - Birads I. F/u mammogram scheduled in 06/2023. Colonoscopy 11/2012.  Recommended f/u in 10 years.  Dr Dawne Euler.  Need results from 03/2023

## 2023-06-05 NOTE — Progress Notes (Signed)
 Subjective:    Patient ID: Courtney Donovan, female    DOB: [birth year], 63 y.o.   MRN: [id number]  Patient here for  Chief Complaint  Patient presents with   Annual Exam    HPI Here for a physical exam. Saw pulmonary 04/02/23 - f/u sleep apnea. Continue cpap. On mounjaro. EGD/colonoscopy 03/2023. Need results. She had questions about obtaining labs. Request to have B12 level rechecked. Also questioning if menopausal. S/;p hysterectomy. Is 62. Discussed menopause. Doing well with mounjaro. Tries to stay active. No chest pain or sob reported. No abdominal pain reported. Does report right shoulder pain - present for 2-3 months. Lying down aggravates. Some pain with rom. Acid reflux is well controlled. Dsicussed decreasing prilosec to 20mg  (instead of 40mg  ).  Reviewed outside blood pressures and blood sugars - averaging 90-110 in am.    Past Medical History:  Diagnosis Date   Allergy    Anemia    Chronic headaches    Diabetes mellitus (HCC)    diet controlled   Hypercholesterolemia    Hypertension    Past Surgical History:  Procedure Laterality Date   ABDOMINAL HYSTERECTOMY  2011   supracervical hysterectomy   TONSILECTOMY/ADENOIDECTOMY WITH MYRINGOTOMY  1067   TONSILLECTOMY     TUBAL LIGATION  1996   Family History  Problem Relation Age of Onset   Hypertension Mother    Cancer Father        lung   Hypertension Father    Breast cancer Neg Hx    Social History   Socioeconomic History   Marital status: Married    Spouse name: Not on file   Number of children: 2   Years of education: Not on file   Highest education level: Not on file  Occupational History    Employer: [employer]  Tobacco Use   Smoking status: Never   Smokeless tobacco: Never  Vaping Use   Vaping status: Never Used  Substance and Sexual Activity   Alcohol use: No    Alcohol/week: 0.0 standard drinks of alcohol   Drug use: No   Sexual activity: Yes  Other Topics Concern   Not on file   Social History Narrative   Not on file   Social Drivers of Health   Financial Resource Strain: Low Risk  (01/26/2023)   Received from [facility]   Overall Financial Resource Strain (CARDIA)    Difficulty of Paying Living Expenses: Not hard at all  Food Insecurity: No Food Insecurity (01/26/2023)   Received from [facility]   Hunger Vital Sign    Worried About Running Out of Food in the Last Year: Never true    Ran Out of Food in the Last Year: Never true  Transportation Needs: No Transportation Needs (01/26/2023)   Received from [facility] - Transportation    In the past 12 months, has lack of transportation kept you from medical appointments or from getting medications?: No    Lack of Transportation (Non-Medical): No  Physical Activity: Not on file  Stress: Not on file  Social Connections: Not on file     Review of Systems  Constitutional:  Negative for appetite change and unexpected weight change.  HENT:  Negative for congestion, sinus pressure and sore throat.   Eyes:  Negative for pain and visual disturbance.  Respiratory:  Negative for cough, chest tightness and shortness of breath.   Cardiovascular:  Negative for chest pain, palpitations  and leg swelling.  Gastrointestinal:  Negative for abdominal pain, diarrhea, nausea and vomiting.  Genitourinary:  Negative for difficulty urinating and dysuria.  Musculoskeletal:  Negative for joint swelling and myalgias.       Right shoulder pian as outlined.   Skin:  Negative for color change and rash.  Neurological:  Negative for dizziness and headaches.  Hematological:  Negative for adenopathy. Does not bruise/bleed easily.  Psychiatric/Behavioral:  Negative for agitation and dysphoric mood.        Objective:     BP 118/70   Pulse 71   Temp 97.9 F (36.6 C)   Resp 16   Ht 4\' 11"  (1.499 m)   Wt 160 lb (72.6 kg)   SpO2 99%   BMI 32.32 kg/m  Wt Readings  from Last 3 Encounters:  06/05/23 160 lb (72.6 kg)  04/02/23 171 lb 12.8 oz (77.9 kg)  02/21/23 177 lb (80.3 kg)    Physical Exam Vitals reviewed.  Constitutional:      General: She is not in acute distress.    Appearance: Normal appearance. She is well-developed.  HENT:     Head: Normocephalic and atraumatic.     Right Ear: External ear normal.     Left Ear: External ear normal.     Mouth/Throat:     Pharynx: No oropharyngeal exudate or posterior oropharyngeal erythema.  Eyes:     General: No scleral icterus.       Right eye: No discharge.        Left eye: No discharge.     Conjunctiva/sclera: Conjunctivae normal.  Neck:     Thyroid : No thyromegaly.  Cardiovascular:     Rate and Rhythm: Normal rate and regular rhythm.  Pulmonary:     Effort: No tachypnea, accessory muscle usage or respiratory distress.     Breath sounds: Normal breath sounds. No decreased breath sounds or wheezing.  Chest:  Breasts:    Right: No inverted nipple, mass, nipple discharge or tenderness (no axillary adenopathy).     Left: No inverted nipple, mass, nipple discharge or tenderness (no axilarry adenopathy).  Abdominal:     General: Bowel sounds are normal.     Palpations: Abdomen is soft.     Tenderness: There is no abdominal tenderness.  Musculoskeletal:        General: No swelling or tenderness.     Cervical back: Neck supple.  Lymphadenopathy:     Cervical: No cervical adenopathy.  Skin:    Findings: No erythema or rash.  Neurological:     Mental Status: She is alert and oriented to person, place, and time.  Psychiatric:        Mood and Affect: Mood normal.        Behavior: Behavior normal.         Outpatient Encounter Medications as of 06/05/2023  Medication Sig   Continuous Glucose Sensor (FREESTYLE LIBRE 3 PLUS SENSOR) MISC by Does not apply route. Change sensor every 15 days.   omeprazole  (PRILOSEC) 20 MG capsule Take 1 capsule (20 mg total) by mouth daily.   amLODipine   (NORVASC ) 5 MG tablet TAKE 1 TABLET BY MOUTH DAILY   aspirin 81 MG chewable tablet Chew 81 mg by mouth daily.   Continuous Glucose Receiver (FREESTYLE LIBRE 3 READER) DEVI Use as directed to check blood sugars daily.   desonide  (DESOWEN ) 0.05 % cream Apply topically 2 (two) times daily as needed (Rash). For 2 weeks   fluticasone  (FLONASE ) 50 MCG/ACT nasal spray  Place 2 sprays into both nostrils daily.   glucose blood test strip Use as instructed to check blood sugars once daily.   LORazepam  (ATIVAN ) 0.5 MG tablet 1/2 tablet q day prn   magnesium  oxide (MAG-OX) 400 (240 Mg) MG tablet Take 1 tablet (400 mg total) by mouth daily.   Multiple Vitamin (MULTIVITAMIN) tablet Take 1 tablet by mouth daily.   nystatin  cream (MYCOSTATIN ) Apply 1 Application topically 2 (two) times daily.   ondansetron  (ZOFRAN ) 4 MG tablet Take 1 tablet (4 mg total) by mouth 2 (two) times daily as needed for nausea or vomiting.   oxybutynin  (DITROPAN -XL) 5 MG 24 hr tablet Take 1 tablet (5 mg total) by mouth at bedtime.   Rimegepant Sulfate (NURTEC) 75 MG TBDP Take 75 mg by mouth daily as needed.   rosuvastatin  (CRESTOR ) 10 MG tablet TAKE 1 TABLET BY MOUTH DAILY   tirzepatide (MOUNJARO) 5 MG/0.5ML Pen Inject 5 mg into the skin once a week.   tiZANidine  (ZANAFLEX ) 2 MG tablet Take 1 tablet (2 mg total) by mouth 2 (two) times daily as needed for muscle spasms.   Vitamin D , Ergocalciferol , (DRISDOL ) 1.25 MG (50000 UNIT) CAPS capsule Take 1 capsule (50,000 Units total) by mouth once a week.   [DISCONTINUED] Continuous Glucose Sensor (DEXCOM G7 SENSOR) MISC Use as directed to monitor sugars. Change every 10 days.   [DISCONTINUED] omeprazole  (PRILOSEC) 40 MG capsule Take 1 capsule (40 mg total) by mouth daily.   [DISCONTINUED] Vitamin D , Ergocalciferol , (DRISDOL ) 1.25 MG (50000 UNIT) CAPS capsule Take 1 capsule by mouth once a week   No facility-administered encounter medications on file as of 06/05/2023.     Lab Results   Component Value Date   WBC 5.5 02/14/2023   HGB 12.3 02/14/2023   HCT 37.8 02/14/2023   PLT 208.0 02/14/2023   GLUCOSE 89 05/31/2023   CHOL 178 05/31/2023   TRIG 79.0 05/31/2023   HDL 42.80 05/31/2023   LDLDIRECT 159.5 01/16/2013   LDLCALC 120 (H) 05/31/2023   ALT 13 05/31/2023   AST 13 05/31/2023   NA 140 05/31/2023   K 4.1 05/31/2023   CL 102 05/31/2023   CREATININE 0.82 05/31/2023   BUN 17 05/31/2023   CO2 31 05/31/2023   TSH 1.83 05/31/2023   HGBA1C 6.1 05/31/2023   MICROALBUR <0.7 11/08/2022    US  Pelvic Complete With Transvaginal Result Date: 04/18/2022 CLINICAL DATA:  Initial evaluation for persistent pelvic pain. EXAM: TRANSABDOMINAL AND TRANSVAGINAL ULTRASOUND OF PELVIS TECHNIQUE: Both transabdominal and transvaginal ultrasound examinations of the pelvis were performed. Transabdominal technique was performed for global imaging of the pelvis including uterus, ovaries, adnexal regions, and pelvic cul-de-sac. It was necessary to proceed with endovaginal exam following the transabdominal exam to visualize the ovaries. COMPARISON:  Prior ultrasound from 01/04/2021. FINDINGS: Uterus Prior hysterectomy.  No abnormality about the vaginal cuff. Endometrium Surgically absent. Right ovary Measurements: 2.6 x 1.4 x 1.5 cm = volume: 2.7 mL. Normal appearance/no adnexal mass. Left ovary Measurements: 1.8 x 1.0 x 1.2 cm = volume: 1.1 mL. Normal appearance/no adnexal mass. Other findings No abnormal free fluid. IMPRESSION: 1. Prior hysterectomy. 2. Normal sonographic appearance of the ovaries. No adnexal mass or free fluid. Electronically Signed   By: Virgia Griffins M.D.   On: 04/18/2022 18:51       Assessment & Plan:  Routine general medical examination at a health care facility  Health care maintenance Assessment & Plan: Physical today 06/05/23.   Mammogram 04/14/22 - Birads I.  F/u mammogram scheduled in 06/2023. Colonoscopy 11/2012.  Recommended f/u in 10 years.  Dr Dawne Euler.  Need  results from 03/2023    Right shoulder pain, unspecified chronicity Assessment & Plan: Increased pain as outlined.  Check xray. Further w/up pending results.   Orders: -     DG Shoulder Right; Future -     Follicle stimulating hormone  Vitamin D  deficiency Assessment & Plan: Recheck vitamin D  level today.    B12 deficiency Assessment & Plan: Check B12 level with today's labs.  Orders: -     Vitamin B12  Type 2 diabetes mellitus with hyperglycemia, with long-term current use of insulin (HCC) Assessment & Plan: Continues on mounjaro. Sugars as outlined. Continue diet and exercise.  Follow met b and A1c.     Hypercholesterolemia Assessment & Plan: Continue crestor .  Low cholesterol diet and exercise.  Check lipid panel today.  Lab Results  Component Value Date   CHOL 178 05/31/2023   HDL 42.80 05/31/2023   LDLCALC 120 (H) 05/31/2023   LDLDIRECT 159.5 01/16/2013   TRIG 79.0 05/31/2023   CHOLHDL 4 05/31/2023     Gastroesophageal reflux disease, unspecified whether esophagitis present Assessment & Plan: Conitnue prilosec. Will decrease dose to 20mg  q day.    OSA (obstructive sleep apnea) Assessment & Plan: CPAP.    Primary hypertension Assessment & Plan: Continue amlodipine . Follow pressures. Check metabolic panel today. No change in medication.    Other orders -     Omeprazole ; Take 1 capsule (20 mg total) by mouth daily.  Dispense: 90 capsule; Refill: 1 -     Vitamin D  (Ergocalciferol ); Take 1 capsule (50,000 Units total) by mouth once a week.  Dispense: 12 capsule; Refill: 0     Dellar Fenton, MD

## 2023-06-07 ENCOUNTER — Ambulatory Visit: Payer: Self-pay | Admitting: Internal Medicine

## 2023-06-08 NOTE — Telephone Encounter (Signed)
 Copied from CRM [id number]. Topic: Clinical - Lab/Test Results >> Jun 08, 2023  9:23 AM Adonis Hoot wrote: Reason for CRM: Patient returned cal to [facility] regarding xray results I relayed message to her.She said that she may hold off on the PT. Stated that she would send a message to provider through [facility] as well.

## 2023-06-10 ENCOUNTER — Other Ambulatory Visit: Payer: Self-pay | Admitting: Internal Medicine

## 2023-06-10 ENCOUNTER — Encounter: Payer: Self-pay | Admitting: Internal Medicine

## 2023-06-10 NOTE — Assessment & Plan Note (Signed)
 Continues on mounjaro. Sugars as outlined. Continue diet and exercise.  Follow met b and A1c.

## 2023-06-10 NOTE — Assessment & Plan Note (Signed)
 Recheck vitamin  D level today.

## 2023-06-10 NOTE — Assessment & Plan Note (Signed)
 Increased pain as outlined.  Check xray. Further w/up pending results.

## 2023-06-10 NOTE — Assessment & Plan Note (Signed)
 Conitnue prilosec. Will decrease dose to 20mg  q day.

## 2023-06-10 NOTE — Assessment & Plan Note (Signed)
 CPAP.

## 2023-06-10 NOTE — Assessment & Plan Note (Signed)
 Continue crestor .  Low cholesterol diet and exercise.  Check lipid panel today.  Lab Results  Component Value Date   CHOL 178 05/31/2023   HDL 42.80 05/31/2023   LDLCALC 120 (H) 05/31/2023   LDLDIRECT 159.5 01/16/2013   TRIG 79.0 05/31/2023   CHOLHDL 4 05/31/2023

## 2023-06-10 NOTE — Assessment & Plan Note (Signed)
 Continue amlodipine . Follow pressures. Check metabolic panel today. No change in medication.

## 2023-06-10 NOTE — Assessment & Plan Note (Signed)
 Check B12 level with today's labs.

## 2023-06-12 ENCOUNTER — Ambulatory Visit
Admission: RE | Admit: 2023-06-12 | Discharge: 2023-06-12 | Disposition: A | Discharge status: Home / Self Care | Source: Ambulatory Visit | Attending: Internal Medicine | Admitting: Internal Medicine

## 2023-06-12 DIAGNOSIS — Z1231 Encounter for screening mammogram for malignant neoplasm of breast: Secondary | ICD-10-CM | POA: Diagnosis present

## 2023-06-21 ENCOUNTER — Ambulatory Visit: Admitting: Obstetrics

## 2023-06-21 ENCOUNTER — Encounter: Payer: Self-pay | Admitting: Obstetrics

## 2023-06-21 VITALS — BP 123/73 | HR 73 | Wt 160.0 lb

## 2023-06-21 DIAGNOSIS — G8929 Other chronic pain: Secondary | ICD-10-CM | POA: Diagnosis not present

## 2023-06-21 DIAGNOSIS — Z780 Asymptomatic menopausal state: Secondary | ICD-10-CM

## 2023-06-21 DIAGNOSIS — N3941 Urge incontinence: Secondary | ICD-10-CM | POA: Diagnosis not present

## 2023-06-21 MED ORDER — DICLOFENAC SODIUM 1 % EX GEL: 2.0000 g | Freq: Four times a day (QID) | CUTANEOUS | 6 refills | Status: AC | PRN

## 2023-06-21 MED ORDER — ESTRADIOL 0.1 MG/GM VA CREA: 0.2500 | TOPICAL_CREAM | Freq: Every day | VAGINAL | 12 refills | Status: DC

## 2023-06-21 NOTE — Progress Notes (Signed)
   GYN ENCOUNTER  Subjective  HPI: Courtney Donovan is a 63 y.o. [phone] who presents today to discuss menopause symptoms and HRT. She had supracervical hysterectomy in 2011 without oophorectomy. She has a h/o hot flashes throughout her 30s that have improved over the past few years. Her primary concerns now are vaginal dryness, low libido, poor sleep, brain fog, irritability, and urge incontinence. She experiences pelvic pain that she suspects is related to adhesions from her surgery. 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 She was started on an estrogen patch (Microvelle) in 2015 but does not recall how long ago she stopped using it. FSH on 06/05/23 was 30.7. She has been using oxybutynin  with some relief for urge incontinence but would rather not continue it.  Past Medical History:  Diagnosis Date   Allergy    Anemia    Chronic headaches    Diabetes mellitus (HCC)    diet controlled   Hypercholesterolemia    Hypertension    Past Surgical History:  Procedure Laterality Date   ABDOMINAL HYSTERECTOMY  2011   supracervical hysterectomy   TONSILECTOMY/ADENOIDECTOMY WITH MYRINGOTOMY  1067   TONSILLECTOMY     TUBAL LIGATION  1996   OB History     Gravida  2   Para  2   Term  2   Preterm      AB      Living  2      SAB      IAB      Ectopic      Multiple      Live Births  2          Allergies  Allergen Reactions   Atorvastatin  Other (See Comments)    Other reaction(s): Muscle Pain   Metrogel [Metronidazole]    Pravastatin      Other reaction(s): Muscle Pain CANT TAKE MYALGIAS   Latex Rash    ROS: Pertinent items noted in HPI  Objective  BP 123/73   Pulse 73   Wt 160 lb (72.6 kg)   BMI 32.32 kg/m   Physical examination Deferred  Assessment Vasomotor symptoms of menopause Urge incontinence Chronic pain  Plan -Due to complicated medical history and prior use of HRT, recommend consult  with MD to further discuss if she is an appropriate candidate for HRT at this time.  -Sleep deprivation from shoulder pain seems to be a contributor to brain fog and irritability. Rx sent for Voltaren gel for shoulder pain. Encouraged to follow up on PCP recommendation for PT. -Rx sent for vaginal estrogen cream. Discussed proper usage -Encouraged herbal remedies including red clover, black cohosh, and valerian root. -Discussed pelvic floor strengthening exercises to improve urge incontinence. Consider pelvic PT -Discussed castor oil packs and cupping for pelvic/low back pain  Follow up for annual visit or PRN  Josue Nip, CNM

## 2023-06-22 ENCOUNTER — Ambulatory Visit: Admitting: Obstetrics and Gynecology

## 2023-06-22 ENCOUNTER — Other Ambulatory Visit: Payer: Self-pay

## 2023-06-24 ENCOUNTER — Other Ambulatory Visit: Payer: Self-pay

## 2023-06-24 ENCOUNTER — Ambulatory Visit
Admission: EM | Admit: 2023-06-24 | Discharge: 2023-06-24 | Disposition: A | Discharge status: Home / Self Care | Attending: Emergency Medicine | Admitting: Emergency Medicine

## 2023-06-24 ENCOUNTER — Emergency Department: Admission: EM | Admit: 2023-06-24 | Discharge: 2023-06-24 | Disposition: A | Discharge status: Home / Self Care

## 2023-06-24 DIAGNOSIS — I10 Essential (primary) hypertension: Secondary | ICD-10-CM | POA: Diagnosis not present

## 2023-06-24 DIAGNOSIS — R42 Dizziness and giddiness: Secondary | ICD-10-CM | POA: Insufficient documentation

## 2023-06-24 DIAGNOSIS — E119 Type 2 diabetes mellitus without complications: Secondary | ICD-10-CM | POA: Insufficient documentation

## 2023-06-24 DIAGNOSIS — R55 Syncope and collapse: Secondary | ICD-10-CM | POA: Diagnosis not present

## 2023-06-24 DIAGNOSIS — R7989 Other specified abnormal findings of blood chemistry: Secondary | ICD-10-CM | POA: Insufficient documentation

## 2023-06-24 DIAGNOSIS — R531 Weakness: Secondary | ICD-10-CM

## 2023-06-24 DIAGNOSIS — M25511 Pain in right shoulder: Secondary | ICD-10-CM

## 2023-06-24 LAB — COMPREHENSIVE METABOLIC PANEL WITH GFR
ALT: 19 U/L (ref 0–44)
AST: 18 U/L (ref 15–41)
Albumin: 4.6 g/dL (ref 3.5–5.0)
Alkaline Phosphatase: 72 U/L (ref 38–126)
Anion gap: 10 (ref 5–15)
BUN: 21 mg/dL — ABNORMAL HIGH (ref 6–20)
CO2: 25 mmol/L (ref 22–32)
Calcium: 9.6 mg/dL (ref 8.9–10.3)
Chloride: 102 mmol/L (ref 98–111)
Creatinine, Ser: 0.89 mg/dL (ref 0.44–1.00)
GFR, Estimated: >60 mL/min
Glucose, Bld: 105 mg/dL — ABNORMAL HIGH (ref 70–99)
Potassium: 4.3 mmol/L (ref 3.5–5.1)
Sodium: 137 mmol/L (ref 135–145)
Total Bilirubin: 0.9 mg/dL (ref 0.0–1.2)
Total Protein: 7.9 g/dL (ref 6.5–8.1)

## 2023-06-24 LAB — CBC
HCT: 42 % (ref 36.0–46.0)
Hemoglobin: 13.4 g/dL (ref 12.0–15.0)
MCH: 27.9 pg (ref 26.0–34.0)
MCHC: 31.9 g/dL (ref 30.0–36.0)
MCV: 87.3 fL (ref 80.0–100.0)
Platelets: 269 10*3/uL (ref 150–400)
RBC: 4.81 MIL/uL (ref 3.87–5.11)
RDW: 13.8 % (ref 11.5–15.5)
WBC: 6.2 10*3/uL (ref 4.0–10.5)
nRBC: 0 % (ref 0.0–0.2)

## 2023-06-24 LAB — TROPONIN I (HIGH SENSITIVITY): Troponin I (High Sensitivity): <2 ng/L (ref <18)

## 2023-06-24 LAB — URINALYSIS, ROUTINE W REFLEX MICROSCOPIC
Bilirubin Urine: NEGATIVE
Glucose, UA: NEGATIVE mg/dL
Hgb urine dipstick: NEGATIVE
Ketones, ur: NEGATIVE mg/dL
Leukocytes,Ua: NEGATIVE
Nitrite: NEGATIVE
Protein, ur: NEGATIVE mg/dL
Specific Gravity, Urine: 1.005 (ref 1.005–1.030)
pH: 5 (ref 5.0–8.0)

## 2023-06-24 NOTE — ED Provider Notes (Signed)
 [facility] Provider Note    Event Date/Time   First MD Initiated Contact with Patient 06/24/23 1500     (approximate)   History   Near Syncope   HPI  Courtney Donovan is a 63 y.o. female who presents to the ED for evaluation of Near Syncope   Review of routine PCP visit from 5/27.  Obese patient with history of DM, HTN, HLD, sleep apnea.  GERD  Patient presents to the ED for evaluation after an episode of presyncopal dizziness this morning that has since resolved.  Reports waking up normal for couple hours, doing some dishes and various activities around the house without symptoms.  Developed presyncopal dizziness that lasted 1-2 hours, resolving after drinking liquid IV.   No falls or syncope.  Alongside the dizziness she had no coexisting symptoms such as pain, chest pain, headache, emesis or stool changes.   Physical Exam   Triage Vital Signs: ED Triage Vitals  Encounter Vitals Group     BP 06/24/23 1254 (!) 156/79     Girls Systolic BP Percentile --      Girls Diastolic BP Percentile --      Boys Systolic BP Percentile --      Boys Diastolic BP Percentile --      Pulse Rate 06/24/23 1254 75     Resp 06/24/23 1254 16     Temp 06/24/23 1254 98 F (36.7 C)     Temp src --      SpO2 06/24/23 1254 100 %     Weight 06/24/23 1256 158 lb (71.7 kg)     Height 06/24/23 1256 4' 11 (1.499 m)     Head Circumference --      Peak Flow --      Pain Score 06/24/23 1255 4     Pain Loc --      Pain Education --      Exclude from Growth Chart --     Most recent vital signs: Vitals:   06/24/23 1254  BP: (!) 156/79  Pulse: 75  Resp: 16  Temp: 98 F (36.7 C)  SpO2: 100%    General: Awake, no distress.  Well-appearing and conversational CV:  Good peripheral perfusion.  Resp:  Normal effort.  Abd:  No distention.  MSK:  No deformity noted.  Neuro:  No focal deficits appreciated. Cranial nerves II through XII intact 5/5 strength  and sensation in all 4 extremities Other:     ED Results / Procedures / Treatments   Labs (all labs ordered are listed, but only abnormal results are displayed) Labs Reviewed  COMPREHENSIVE METABOLIC PANEL WITH GFR - Abnormal; Notable for the following components:      Result Value   Glucose, Bld 105 (*)    BUN 21 (*)    All other components within normal limits  URINALYSIS, ROUTINE W REFLEX MICROSCOPIC - Abnormal; Notable for the following components:   Color, Urine STRAW (*)    APPearance CLEAR (*)    All other components within normal limits  CBC  CBG MONITORING, ED  TROPONIN I (HIGH SENSITIVITY)  TROPONIN I (HIGH SENSITIVITY)    EKG Sinus rhythm the rate is 72 bpm.  Normal axis and intervals.  No acute signs of acute ischemia.  RADIOLOGY   Official radiology report(s): No results found.  PROCEDURES and INTERVENTIONS:  .1-3 Lead EKG Interpretation  Performed by: Arline Bennett, MD Authorized by: Arline Bennett, MD     Interpretation:  normal     ECG rate:  70   ECG rate assessment: normal     Rhythm: sinus rhythm     Ectopy: none     Conduction: normal     Medications - No data to display   IMPRESSION / MDM / ASSESSMENT AND PLAN / ED COURSE  I reviewed the triage vital signs and the nursing notes.  Differential diagnosis includes, but is not limited to, medication side effect, dehydration or AKI, sepsis, cardiac dysrhythmia, acute stress reaction or anxiety  {Patient presents with symptoms of an acute illness or injury that is potentially life-threatening.  Patient presents after resolved episode of presyncopal dizziness with a benign workup.  She looks well to me with a normal exam without signs of trauma, neurologic deficits.   Benign workup with renal function at baseline, mildly elevated BUN could indicate a mild degree of dehydration.  UA, CBC are normal.  Negative troponin.  Observed for about 4 hours with no acute features or dysrhythmias.  Suitable for  outpatient management.  Clinical Course as of 06/24/23 1639  Sun Jun 24, 2023  1638 Reassessed, feeling well.  Discussed return precautions.  She was appreciative. [DS]    Clinical Course User Index [DS] Arline Bennett, MD     FINAL CLINICAL IMPRESSION(S) / ED DIAGNOSES   Final diagnoses:  Dizziness     Rx / DC Orders   ED Discharge Orders     None        Note:  This document was prepared using Dragon voice recognition software and may include unintentional dictation errors.   Arline Bennett, MD 06/24/23 1640

## 2023-06-24 NOTE — Discharge Instructions (Signed)
 Go to the emergency department for evaluation of your episode of weakness, dizziness, almost passing out this morning.
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
## 2023-06-24 NOTE — ED Triage Notes (Signed)
 Pt arrives via POV with c/o of near syncopal episode. Pt states that they were feeling normal when they woke up this morning around 0745, and started to feel off around 0930 with dizziness, like they were going to pass out. Pt stated that their HR was high, BP was lower than normal and were still feeling off. Pt had a few sips of a liquid IV but did not make them feel any better. Pt is A&Ox4 and ambulatory during triage.

## 2023-06-24 NOTE — ED Triage Notes (Signed)
 Patient to Urgent Care with complaints of feeling weak and lightheaded this morning. No LOC. Reports she ate/ drank water/ took baby aspirin and started to feel better.   Reports her she took her HR was 90 and her BP was 98/62.   Also w/ complaints of right sided shoulder pain that radiates into her elbow. Reports aching pain at night. Has been evaluated by her pcp for the same.

## 2023-06-24 NOTE — ED Notes (Signed)
 Provided pt with crackers and ginger ale per MD request.

## 2023-08-06 ENCOUNTER — Encounter: Payer: Self-pay | Admitting: Internal Medicine

## 2023-08-06 DIAGNOSIS — R1310 Dysphagia, unspecified: Secondary | ICD-10-CM | POA: Insufficient documentation

## 2023-08-06 NOTE — Telephone Encounter (Signed)
 Please call and notify - I reviewed her recent blood sugars. Blood sugars ok. Need to confirm if tolerating medication. Any problems with the medication?  If no problems and tolerating the medication, ok to increase to 7.5mg  .

## 2023-08-07 ENCOUNTER — Other Ambulatory Visit: Payer: Self-pay

## 2023-08-07 MED ORDER — TIRZEPATIDE 7.5 MG/0.5ML ~~LOC~~ SOAJ
7.5000 mg | SUBCUTANEOUS | 1 refills | Status: DC
Start: 2023-08-07 — End: 2023-11-26

## 2023-08-07 NOTE — Telephone Encounter (Signed)
 Confirmed doing ok. Tolerating medication. Sent in 7.5 mg dose to mail order per patient request.

## 2023-08-08 ENCOUNTER — Telehealth: Payer: Self-pay

## 2023-08-08 ENCOUNTER — Other Ambulatory Visit (HOSPITAL_COMMUNITY): Payer: Self-pay

## 2023-08-08 NOTE — Telephone Encounter (Signed)
 Pharmacy Patient Advocate Encounter   Received notification from CoverMyMeds that prior authorization for Mounjaro  7.5MG /0.5ML auto-injectors is required/requested.   Insurance verification completed.   The patient is insured through [facility] .   Per test claim: PA required; PA submitted to above mentioned insurance via CoverMyMeds Key/confirmation #/EOC [id number] Status is pending

## 2023-08-08 NOTE — Telephone Encounter (Signed)
 Noted

## 2023-08-08 NOTE — Telephone Encounter (Signed)
 PA request has been Submitted. New Encounter has been or will be created for follow up. For additional info see Pharmacy Prior Auth telephone encounter from 08/08/2023.

## 2023-08-09 ENCOUNTER — Other Ambulatory Visit (HOSPITAL_COMMUNITY): Payer: Self-pay

## 2023-08-09 NOTE — Telephone Encounter (Signed)
 Pharmacy Patient Advocate Encounter  Received notification from [employer] that Prior Authorization for Mounjaro  7.5MG /0.5ML auto-injectors has been APPROVED from 08/08/2023 to 08/07/2024. Ran test claim, Copay is $25. This test claim was processed through [facility]- copay amounts may vary at other pharmacies due to pharmacy/plan contracts, or as the patient moves through the different stages of their insurance plan.   PA #/Case ID/Reference #: [id number]

## 2023-08-17 ENCOUNTER — Other Ambulatory Visit: Payer: Self-pay | Admitting: Internal Medicine

## 2023-08-20 ENCOUNTER — Other Ambulatory Visit (HOSPITAL_COMMUNITY): Payer: Self-pay

## 2023-08-20 ENCOUNTER — Telehealth: Payer: Self-pay

## 2023-08-20 MED ORDER — DEXCOM G7 SENSOR MISC: 6 refills | Status: DC

## 2023-08-20 NOTE — Telephone Encounter (Signed)
 Ok

## 2023-08-20 NOTE — Telephone Encounter (Signed)
 Okay to send in RX for Dexcom G7 and send for PA if needed? She did not tolerate libre due to skin irritation

## 2023-08-20 NOTE — Addendum Note (Signed)
 Addended by: LEARTA PORTO D on: 08/20/2023 11:51 AM   Modules accepted: Orders

## 2023-08-20 NOTE — Telephone Encounter (Signed)
 Yes. I will start new encounter for Dexcom. This encounter was started for Mounjaro . In the future please make an encounter new drug, because this one was already signed and marked as complete.

## 2023-08-20 NOTE — Telephone Encounter (Signed)
 Patient needs PA on dexcom sensors. She is allergic to the adhesive on the [place]. Did not tolerate and was causing false readings. Please submit PA

## 2023-08-20 NOTE — Telephone Encounter (Signed)
 Pharmacy Patient Advocate Encounter   Received notification from Pt Calls Messages that prior authorization for Dexcom G7 Sensor  is required/requested.   Insurance verification completed.   The patient is insured through [facility] .   Per test claim: PA required; PA started via CoverMyMeds. KEY [id number] . Waiting for clinical questions to populate.  **UNDER LANTENT** CLINICAL QUESTIONS HAVE NOT POPULATED YET. TICKET OPENED.

## 2023-08-21 NOTE — Telephone Encounter (Signed)
 Noted

## 2023-08-22 ENCOUNTER — Other Ambulatory Visit (HOSPITAL_COMMUNITY): Payer: Self-pay

## 2023-08-23 ENCOUNTER — Other Ambulatory Visit (HOSPITAL_COMMUNITY): Payer: Self-pay

## 2023-08-23 ENCOUNTER — Telehealth: Payer: Self-pay

## 2023-08-23 NOTE — Telephone Encounter (Signed)
 Patient reaching out to follow up on her Dexcom PA.

## 2023-08-23 NOTE — Telephone Encounter (Signed)
 Hi still waiting on decision from insurance. I will update as soon as we receive a response from payer.

## 2023-08-27 NOTE — Telephone Encounter (Signed)
 FYI for PA team. Per last message, waiting on insurance decision.

## 2023-08-30 ENCOUNTER — Other Ambulatory Visit (HOSPITAL_COMMUNITY): Payer: Self-pay

## 2023-08-30 NOTE — Telephone Encounter (Signed)
 Notified patient.

## 2023-08-30 NOTE — Telephone Encounter (Signed)
 Pharmacy Patient Advocate Encounter  Received notification from [employer] that Prior Authorization for Dexcom G7 Sensor has been DENIED.  Full denial letter will be uploaded to the media tab. See denial reason below.  So I called her [employer] and it was denied because the patient is not on an intensive insulin regimen (3 or more insulin injections per day or uses continuous subcutaneous insulin infusion pump) Patient does not have a history of a level 3 hypoglycemic event, (2) Patient has not had a history of more than one level 2 hypoglycemia events that persist despite multiple attempts to adjust medication(s)      PA #/Case ID/Reference #: [id number]

## 2023-09-04 ENCOUNTER — Other Ambulatory Visit (INDEPENDENT_AMBULATORY_CARE_PROVIDER_SITE_OTHER)

## 2023-09-04 DIAGNOSIS — E1165 Type 2 diabetes mellitus with hyperglycemia: Secondary | ICD-10-CM

## 2023-09-04 DIAGNOSIS — Z794 Long term (current) use of insulin: Secondary | ICD-10-CM | POA: Diagnosis not present

## 2023-09-04 DIAGNOSIS — E7800 Pure hypercholesterolemia, unspecified: Secondary | ICD-10-CM

## 2023-09-04 LAB — BASIC METABOLIC PANEL WITH GFR
BUN: 18 mg/dL (ref 6–23)
CO2: 29 meq/L (ref 19–32)
Calcium: 9.2 mg/dL (ref 8.4–10.5)
Chloride: 102 meq/L (ref 96–112)
Creatinine, Ser: 0.97 mg/dL (ref 0.40–1.20)
GFR: 63.22 mL/min (ref >60.00)
Glucose, Bld: 95 mg/dL (ref 70–99)
Potassium: 4.1 meq/L (ref 3.5–5.1)
Sodium: 139 meq/L (ref 135–145)

## 2023-09-04 LAB — LIPID PANEL
Cholesterol: 212 mg/dL — ABNORMAL HIGH (ref 0–200)
HDL: 45.4 mg/dL (ref >39.00)
LDL Cholesterol: 149 mg/dL — ABNORMAL HIGH (ref 0–99)
NonHDL: 166.53
Total CHOL/HDL Ratio: 5
Triglycerides: 89 mg/dL (ref 0.0–149.0)
VLDL: 17.8 mg/dL (ref 0.0–40.0)

## 2023-09-04 LAB — HEPATIC FUNCTION PANEL
ALT: 10 U/L (ref 0–35)
AST: 12 U/L (ref 0–37)
Albumin: 4.4 g/dL (ref 3.5–5.2)
Alkaline Phosphatase: 73 U/L (ref 39–117)
Bilirubin, Direct: 0.1 mg/dL (ref 0.0–0.3)
Total Bilirubin: 0.5 mg/dL (ref 0.2–1.2)
Total Protein: 7.3 g/dL (ref 6.0–8.3)

## 2023-09-04 LAB — HEMOGLOBIN A1C: Hgb A1c MFr Bld: 6.1 % (ref 4.6–6.5)

## 2023-09-06 ENCOUNTER — Ambulatory Visit: Admitting: Internal Medicine

## 2023-09-06 VITALS — BP 112/68 | HR 81 | Resp 16 | Ht 59.0 in | Wt 149.8 lb

## 2023-09-06 DIAGNOSIS — M25511 Pain in right shoulder: Secondary | ICD-10-CM

## 2023-09-06 DIAGNOSIS — E7800 Pure hypercholesterolemia, unspecified: Secondary | ICD-10-CM

## 2023-09-06 DIAGNOSIS — E1165 Type 2 diabetes mellitus with hyperglycemia: Secondary | ICD-10-CM | POA: Diagnosis not present

## 2023-09-06 DIAGNOSIS — R1310 Dysphagia, unspecified: Secondary | ICD-10-CM | POA: Diagnosis not present

## 2023-09-06 DIAGNOSIS — F439 Reaction to severe stress, unspecified: Secondary | ICD-10-CM

## 2023-09-06 DIAGNOSIS — G4733 Obstructive sleep apnea (adult) (pediatric): Secondary | ICD-10-CM

## 2023-09-06 DIAGNOSIS — Z136 Encounter for screening for cardiovascular disorders: Secondary | ICD-10-CM

## 2023-09-06 DIAGNOSIS — Z794 Long term (current) use of insulin: Secondary | ICD-10-CM

## 2023-09-06 DIAGNOSIS — K219 Gastro-esophageal reflux disease without esophagitis: Secondary | ICD-10-CM

## 2023-09-06 DIAGNOSIS — I10 Essential (primary) hypertension: Secondary | ICD-10-CM

## 2023-09-06 DIAGNOSIS — R32 Unspecified urinary incontinence: Secondary | ICD-10-CM

## 2023-09-06 DIAGNOSIS — E559 Vitamin D deficiency, unspecified: Secondary | ICD-10-CM

## 2023-09-06 LAB — HM DIABETES FOOT EXAM

## 2023-09-06 MED ORDER — AMLODIPINE BESYLATE 5 MG PO TABS: 5.0000 mg | ORAL_TABLET | Freq: Every day | ORAL | 3 refills | Status: DC

## 2023-09-06 MED ORDER — OMEPRAZOLE 20 MG PO CPDR: 20.0000 mg | DELAYED_RELEASE_CAPSULE | Freq: Every day | ORAL | 1 refills | Status: DC

## 2023-09-06 MED ORDER — EZETIMIBE 10 MG PO TABS: 10.0000 mg | ORAL_TABLET | Freq: Every day | ORAL | 3 refills | Status: DC

## 2023-09-06 NOTE — Assessment & Plan Note (Signed)
 S/p dilatation 03/2023 - EGD. On protonix . Recently evaluated by GI 08/06/23. Protonix  decreased to 20mg  q day.

## 2023-09-06 NOTE — Assessment & Plan Note (Signed)
 Continues on mounjaro . Sugars as outlined. Continue diet and exercise.  Follow met b and A1c.  Recent A1c 6.1.

## 2023-09-06 NOTE — Assessment & Plan Note (Addendum)
 Off crestor . Caused muscle aches. Discussed treatment options.   Low cholesterol diet and exercise.  Start zetia  10mg  q day.  Lab Results  Component Value Date   CHOL 212 (H) 09/04/2023   HDL 45.40 09/04/2023   LDLCALC 149 (H) 09/04/2023   LDLDIRECT 159.5 01/16/2013   TRIG 89.0 09/04/2023   CHOLHDL 5 09/04/2023

## 2023-09-06 NOTE — Progress Notes (Signed)
 Subjective:    Patient ID: Courtney Donovan, female    DOB: 14-Apr-1960, 63 y.o.   MRN: [id number]  Patient here for  Chief Complaint  Patient presents with   Medical Management of Chronic Issues    HPI Here for a scheduled follow up - follow up regarding sleep apnea, hypercholesterolemia, hypertension and diabetes. Continues cpap. Continues on mounjaro . Last visit, reported right shoulder pain. Xray - arthritis changes. Had wanted to hold on PT. Request referral now. Saw gyn 06/21/23.  Recommended vaginal estrogen. Consider PFPT.  Evaluated - ED - 06/24/23 - dizziness and near syncope. Negative troponin. Mildly elevated BUN. Questioned mild dehydration. Evaluated by GI 08/06/23 - upper symptoms - better on protonix . Dose decreased to 20mg . Recommended linzess to help with constipation. Discussed labs.  LDL cholesterol 149. Had been on crestor . Not taking now. Had muscle aches with crestor . Discussed other treatment options. Wants to try zetia . No chest pain or sob reported. Keeping bowels moving with prn miralax and suppository if needed. No abdominal pain. Had questions about her sugars. A1c 6.1.    Past Medical History:  Diagnosis Date   Allergy    Anemia    Chronic headaches    Diabetes mellitus (HCC)    diet controlled   Hypercholesterolemia    Hypertension    Past Surgical History:  Procedure Laterality Date   ABDOMINAL HYSTERECTOMY  2011   supracervical hysterectomy   TONSILECTOMY/ADENOIDECTOMY WITH MYRINGOTOMY  1067   TONSILLECTOMY     TUBAL LIGATION  1996   Family History  Problem Relation Age of Onset   Hypertension Mother    Cancer Father        lung   Hypertension Father    Breast cancer Neg Hx    Social History   Socioeconomic History   Marital status: Married    Spouse name: Not on file   Number of children: 2   Years of education: Not on file   Highest education level: Not on file  Occupational History    Employer: [employer]  Tobacco Use    Smoking status: Never   Smokeless tobacco: Never  Vaping Use   Vaping status: Never Used  Substance and Sexual Activity   Alcohol use: No    Alcohol/week: 0.0 standard drinks of alcohol   Drug use: No   Sexual activity: Yes  Other Topics Concern   Not on file  Social History Narrative   Not on file   Social Drivers of Health   Financial Resource Strain: Low Risk  (01/26/2023)   Received from [facility]   Overall Financial Resource Strain (CARDIA)    Difficulty of Paying Living Expenses: Not hard at all  Food Insecurity: No Food Insecurity (01/26/2023)   Received from [facility]   Hunger Vital Sign    Within the past 12 months, you worried that your food would run out before you got the money to buy more.: Never true    Within the past 12 months, the food you bought just didn't last and you didn't have money to get more.: Never true  Transportation Needs: No Transportation Needs (01/26/2023)   Received from [facility] - Transportation    In the past 12 months, has lack of transportation kept you from medical appointments or from getting medications?: No    Lack of Transportation (Non-Medical): No  Physical Activity: Not on file  Stress: Not on file  Social Connections:  Not on file     Review of Systems  Constitutional:  Negative for appetite change and unexpected weight change.  HENT:  Negative for congestion and sinus pressure.   Respiratory:  Negative for cough, chest tightness and shortness of breath.   Cardiovascular:  Negative for chest pain, palpitations and leg swelling.  Gastrointestinal:  Negative for abdominal pain, diarrhea, nausea and vomiting.  Genitourinary:  Negative for difficulty urinating and dysuria.  Musculoskeletal:  Negative for joint swelling and myalgias.       Right shoulder pain - limited rom.   Skin:  Negative for color change and wound.  Neurological:  Negative for dizziness and  headaches.  Psychiatric/Behavioral:  Negative for agitation and dysphoric mood.        Objective:     BP 112/68   Pulse 81   Resp 16   Ht 4' 11 (1.499 m)   Wt 149 lb 12.8 oz (67.9 kg)   SpO2 99%   BMI 30.26 kg/m  Wt Readings from Last 3 Encounters:  09/06/23 149 lb 12.8 oz (67.9 kg)  06/24/23 158 lb (71.7 kg)  06/21/23 160 lb (72.6 kg)    Physical Exam Vitals reviewed.  Constitutional:      Appearance: Normal appearance.  HENT:     Head: Normocephalic and atraumatic.     Right Ear: External ear normal.     Left Ear: External ear normal.     Mouth/Throat:     Pharynx: No oropharyngeal exudate or posterior oropharyngeal erythema.  Eyes:     General: No scleral icterus.       Right eye: No discharge.        Left eye: No discharge.     Conjunctiva/sclera: Conjunctivae normal.  Neck:     Thyroid : No thyromegaly.  Cardiovascular:     Rate and Rhythm: Normal rate and regular rhythm.  Pulmonary:     Effort: No respiratory distress.     Breath sounds: Normal breath sounds. No wheezing.  Abdominal:     General: Bowel sounds are normal.     Palpations: Abdomen is soft.     Tenderness: There is no abdominal tenderness.  Musculoskeletal:        General: No swelling or tenderness.     Cervical back: Neck supple. No tenderness.     Comments: Increaed pain - with abduction - at or above 90 degrees. Limited rom.   Lymphadenopathy:     Cervical: No cervical adenopathy.  Skin:    Findings: No erythema or rash.  Neurological:     Mental Status: She is alert.  Psychiatric:        Mood and Affect: Mood normal.        Behavior: Behavior normal.         Outpatient Encounter Medications as of 09/06/2023  Medication Sig   ezetimibe  (ZETIA ) 10 MG tablet Take 1 tablet (10 mg total) by mouth daily.   amLODipine  (NORVASC ) 5 MG tablet Take 1 tablet (5 mg total) by mouth daily.   aspirin 81 MG chewable tablet Chew 81 mg by mouth daily.   desonide  (DESOWEN ) 0.05 % cream Apply  topically 2 (two) times daily as needed (Rash). For 2 weeks   diclofenac  Sodium (VOLTAREN ) 1 % GEL Apply 2 g topically 4 (four) times daily as needed.   estradiol  (ESTRACE  VAGINAL) 0.1 MG/GM vaginal cream Place 0.25 Applicatorfuls vaginally at bedtime. Use nightly for 2 weeks, then twice a week as needed.   fluticasone  (FLONASE ) 50  MCG/ACT nasal spray Place 2 sprays into both nostrils daily.   glucose blood test strip Use as instructed to check blood sugars once daily.   LORazepam  (ATIVAN ) 0.5 MG tablet 1/2 tablet q day prn   magnesium  oxide (MAG-OX) 400 (240 Mg) MG tablet Take 1 tablet (400 mg total) by mouth daily.   Multiple Vitamin (MULTIVITAMIN) tablet Take 1 tablet by mouth daily.   nystatin  cream (MYCOSTATIN ) Apply 1 Application topically 2 (two) times daily.   omeprazole  (PRILOSEC) 20 MG capsule Take 1 capsule (20 mg total) by mouth daily.   ondansetron  (ZOFRAN ) 4 MG tablet Take 1 tablet (4 mg total) by mouth 2 (two) times daily as needed for nausea or vomiting.   oxybutynin  (DITROPAN -XL) 5 MG 24 hr tablet Take 1 tablet (5 mg total) by mouth at bedtime.   Rimegepant Sulfate (NURTEC) 75 MG TBDP Take 75 mg by mouth daily as needed.   tirzepatide  (MOUNJARO ) 7.5 MG/0.5ML Pen Inject 7.5 mg into the skin once a week.   tiZANidine  (ZANAFLEX ) 2 MG tablet Take 1 tablet (2 mg total) by mouth 2 (two) times daily as needed for muscle spasms.   [DISCONTINUED] amLODipine  (NORVASC ) 5 MG tablet TAKE 1 TABLET BY MOUTH DAILY   [DISCONTINUED] Continuous Glucose Receiver (FREESTYLE LIBRE 3 READER) DEVI Use as directed to check blood sugars daily.   [DISCONTINUED] Continuous Glucose Sensor (DEXCOM G7 SENSOR) MISC Use as directed. Change sensor every 10 days.   [DISCONTINUED] Continuous Glucose Sensor (FREESTYLE LIBRE 3 PLUS SENSOR) MISC by Does not apply route. Change sensor every 15 days.   [DISCONTINUED] omeprazole  (PRILOSEC) 20 MG capsule Take 1 capsule (20 mg total) by mouth daily.   [DISCONTINUED]  rosuvastatin  (CRESTOR ) 10 MG tablet TAKE 1 TABLET BY MOUTH DAILY   [DISCONTINUED] Vitamin D , Ergocalciferol , (DRISDOL ) 1.25 MG (50000 UNIT) CAPS capsule Take 1 capsule (50,000 Units total) by mouth once a week.   No facility-administered encounter medications on file as of 09/06/2023.     Lab Results  Component Value Date   WBC 6.2 06/24/2023   HGB 13.4 06/24/2023   HCT 42.0 06/24/2023   PLT 269 06/24/2023   GLUCOSE 95 09/04/2023   CHOL 212 (H) 09/04/2023   TRIG 89.0 09/04/2023   HDL 45.40 09/04/2023   LDLDIRECT 159.5 01/16/2013   LDLCALC 149 (H) 09/04/2023   ALT 10 09/04/2023   AST 12 09/04/2023   NA 139 09/04/2023   K 4.1 09/04/2023   CL 102 09/04/2023   CREATININE 0.97 09/04/2023   BUN 18 09/04/2023   CO2 29 09/04/2023   TSH 1.83 05/31/2023   HGBA1C 6.1 09/04/2023       Assessment & Plan:  Dysphagia, unspecified type Assessment & Plan: S/p dilatation 03/2023 - EGD. On protonix . Recently evaluated by GI 08/06/23. Protonix  decreased to 20mg  q day.    Hypercholesterolemia Assessment & Plan: Off crestor . Caused muscle aches. Discussed treatment options.   Low cholesterol diet and exercise.  Start zetia  10mg  q day.  Lab Results  Component Value Date   CHOL 212 (H) 09/04/2023   HDL 45.40 09/04/2023   LDLCALC 149 (H) 09/04/2023   LDLDIRECT 159.5 01/16/2013   TRIG 89.0 09/04/2023   CHOLHDL 5 09/04/2023    Orders: -     Hepatic function panel; Future -     Lipid panel; Future -     Basic metabolic panel with GFR; Future  Type 2 diabetes mellitus with hyperglycemia, with long-term current use of insulin (HCC) Assessment & Plan: Continues  on mounjaro . Sugars as outlined. Continue diet and exercise.  Follow met b and A1c.  Recent A1c 6.1.   Orders: -     Hemoglobin A1c; Future -     Microalbumin / creatinine urine ratio; Future  Right shoulder pain, unspecified chronicity Assessment & Plan:  Last visit, reported right shoulder pain. Xray - arthritis changes. Had  wanted to hold on PT. Request referral now.  Orders: -     Ambulatory referral to Physical Therapy  Encounter for screening for coronary artery disease Assessment & Plan: Discussed CT calcium  score. Agreeable.   Orders: -     CT CARDIAC SCORING (SELF PAY ONLY); Future  Vitamin D  deficiency Assessment & Plan: Low 05/2023. Continue vitamin D  supplements.    Urinary incontinence, unspecified type Assessment & Plan: Saw gyn 06/21/23.  Recommended vaginal estrogen. Consider PFPT.    Stress Assessment & Plan: Appears to be handling things relatively well.  Follow.     OSA (obstructive sleep apnea) Assessment & Plan: CPAP.    Primary hypertension Assessment & Plan: Continue amlodipine . Follow pressures. Follow metabolic panel.  No change in medication today.    Gastroesophageal reflux disease, unspecified whether esophagitis present Assessment & Plan: Continues on prilosec.    Other orders -     amLODIPine  Besylate; Take 1 tablet (5 mg total) by mouth daily.  Dispense: 90 tablet; Refill: 3 -     Omeprazole ; Take 1 capsule (20 mg total) by mouth daily.  Dispense: 90 capsule; Refill: 1 -     Ezetimibe ; Take 1 tablet (10 mg total) by mouth daily.  Dispense: 30 tablet; Refill: 3     Allena Hamilton, MD

## 2023-09-07 ENCOUNTER — Other Ambulatory Visit: Payer: Self-pay | Admitting: Internal Medicine

## 2023-09-07 NOTE — Telephone Encounter (Signed)
 Have her continue once a week prescription vitamin D  for 12 more weeks. After completing that rx, then start vitamin D3 1000 units q day (this is over the counter).

## 2023-09-07 NOTE — Telephone Encounter (Signed)
 Refilled: 06/05/2023 Last OV: 09/06/2023 Next OV: 01/17/2024  Last Vitamin D  lab: 05/31/2023 was abnormal.   Does pt need to continue taking the megadose vitamin d ?

## 2023-09-16 ENCOUNTER — Encounter: Payer: Self-pay | Admitting: Internal Medicine

## 2023-09-16 DIAGNOSIS — Z136 Encounter for screening for cardiovascular disorders: Secondary | ICD-10-CM | POA: Insufficient documentation

## 2023-09-16 NOTE — Assessment & Plan Note (Signed)
 CPAP.

## 2023-09-16 NOTE — Assessment & Plan Note (Signed)
 Appears to be handling things relatively well.  Follow.

## 2023-09-16 NOTE — Assessment & Plan Note (Signed)
Discussed CT calcium score.  Agreeable.

## 2023-09-16 NOTE — Assessment & Plan Note (Signed)
 Last visit, reported right shoulder pain. Xray - arthritis changes. Had wanted to hold on PT. Request referral now.

## 2023-09-16 NOTE — Assessment & Plan Note (Signed)
 Saw gyn 06/21/23.  Recommended vaginal estrogen. Consider PFPT.

## 2023-09-16 NOTE — Assessment & Plan Note (Signed)
 Low 05/2023. Continue vitamin D  supplements.

## 2023-09-16 NOTE — Assessment & Plan Note (Signed)
 Continue amlodipine . Follow pressures. Follow metabolic panel. No change in medication today.

## 2023-09-16 NOTE — Assessment & Plan Note (Signed)
Continues on prilosec.  

## 2023-09-17 LAB — OPHTHALMOLOGY REPORT-SCANNED

## 2023-09-19 ENCOUNTER — Telehealth: Payer: Self-pay

## 2023-09-19 NOTE — Telephone Encounter (Signed)
 Copied from CRM [id number]. Topic: Referral - Question >> Sep 19, 2023  9:49 AM Anairis L wrote: Reason for CRM: Patient is calling back because a member from the referral team reached out to her husband. She claims she has no issue with her phone, so can the referral team member give her a call back or msg her via Mychart. Thank you.

## 2023-09-20 NOTE — Telephone Encounter (Signed)
 Noted

## 2023-09-21 NOTE — Telephone Encounter (Signed)
 I called pt again: Pt phone says the call cannot be completed has dialed. I will send pt a mychart msg with the number to call and schedule appt.

## 2023-09-27 ENCOUNTER — Other Ambulatory Visit: Payer: Self-pay | Admitting: Internal Medicine

## 2023-09-28 ENCOUNTER — Other Ambulatory Visit: Payer: Self-pay

## 2023-09-28 MED ORDER — OXYBUTYNIN CHLORIDE ER 5 MG PO TB24: 5.0000 mg | ORAL_TABLET | Freq: Every day | ORAL | 1 refills | Status: AC

## 2023-09-28 MED ORDER — EZETIMIBE 10 MG PO TABS: 10.0000 mg | ORAL_TABLET | Freq: Every day | ORAL | 1 refills | Status: DC

## 2023-09-28 NOTE — Telephone Encounter (Signed)
 Received request for lorazepam . (She had received 5 tablets previously). Is she needing a refill. Anything acute going on?

## 2023-09-28 NOTE — Telephone Encounter (Signed)
 Patient is going to lose her insurance end of Oct and would like to have these to keep on hand. She will not have coverage until she finds another job. (Looks like she normally has 20 at a time sent in instead of 5 tablets)

## 2023-09-29 NOTE — Telephone Encounter (Signed)
 PDMP reviewed. Rx for lorazepam  #20 with no refills sent in to pharmacy.

## 2023-10-01 ENCOUNTER — Other Ambulatory Visit: Payer: Self-pay

## 2023-10-01 MED ORDER — ESTRADIOL 0.1 MG/GM VA CREA: 0.2500 | TOPICAL_CREAM | Freq: Every day | VAGINAL | 2 refills | Status: DC

## 2023-10-01 NOTE — Telephone Encounter (Signed)
 Patient calling due to her mail order pharmacy needing a 3 month supply of refills. Refill sent in.

## 2023-10-08 ENCOUNTER — Ambulatory Visit
Admission: RE | Admit: 2023-10-08 | Discharge: 2023-10-08 | Disposition: A | Discharge status: Home / Self Care | Payer: Self-pay | Source: Ambulatory Visit | Attending: Internal Medicine | Admitting: Internal Medicine

## 2023-10-08 DIAGNOSIS — Z136 Encounter for screening for cardiovascular disorders: Secondary | ICD-10-CM | POA: Insufficient documentation

## 2023-10-09 ENCOUNTER — Ambulatory Visit: Payer: Self-pay | Admitting: Internal Medicine

## 2023-10-09 ENCOUNTER — Telehealth: Payer: Self-pay

## 2023-10-09 NOTE — Telephone Encounter (Signed)
 Patient contacted office with issue with her Estradiol  prescription. Patient states that [facility] received two prescriptions same day for Estradiol . Patient states because of the confusion prescription was not filled and she states that she is down to one left. Patient states that she would like the prescription to be for 90days and that it be resent to [facility] before she runs out. Patient would like a call back once clarification with prescription has been cleared. KW

## 2023-10-17 ENCOUNTER — Other Ambulatory Visit: Payer: Self-pay | Admitting: Obstetrics

## 2023-10-17 MED ORDER — ESTRADIOL 0.1 MG/GM VA CREA
0.2500 | TOPICAL_CREAM | VAGINAL | 3 refills | Status: AC
Start: 2023-10-18 — End: ?

## 2023-10-18 ENCOUNTER — Ambulatory Visit: Payer: Self-pay

## 2023-10-18 NOTE — Telephone Encounter (Signed)
 Patient has been scheduled for appt at [facility] tomorrow morning

## 2023-10-18 NOTE — Telephone Encounter (Signed)
 FYI Only or Action Required?: FYI only for provider.  Patient was last seen in primary care on 09/06/2023 by Glendia Shad, MD.  Called Nurse Triage reporting Hematuria.  Symptoms began today.  Interventions attempted: Nothing.  Symptoms are: stable.  Triage Disposition: See Physician Within 24 Hours  Patient/caregiver understands and will follow disposition?: Yes Reason for Disposition  Blood in urine  (Exception: Could be normal menstrual bleeding.)  Answer Assessment - Initial Assessment Questions Stated jumped down from a high truck yesterday and hurt foot, unsure if that initiated anything. Had sexual intercourse yesterday afternoon, urinated after having intercourse.   1. COLOR of URINE: Describe the color of the urine.  (e.g., tea-colored, pink, red, bloody) Do you have blood clots in your urine? (e.g., none, pea, grape, small coin)     Toilet water was tinted red, and saw a tiny clot when wiping  2. ONSET: When did the bleeding start?      This morning   3. EPISODES: How many times has there been blood in the urine? or How many times today?     Every time urinated today (3 times)  4. PAIN with URINATION: Is there any pain with passing your urine? If Yes, ask: How bad is the pain?  (Scale 1-10; or mild, moderate, severe)     Denies burning, but states feeling pressure  5. FEVER: Do you have a fever? If Yes, ask: What is your temperature, how was it measured, and when did it start?     Denies  6. ASSOCIATED SYMPTOMS: Are you passing urine more frequently than usual?     Pressure making feeling like has to go  7. OTHER SYMPTOMS: Do you have any other symptoms? (e.g., back/flank pain, abdomen pain, vomiting)     Pressure at bottom of stomach  Protocols used: Urine - Blood In-A-AH  Copied from CRM [id number]. Topic: Clinical - Red Word Triage >> Oct 18, 2023 10:33 AM Rea BROCKS wrote: Red Word that prompted transfer to Nurse Triage: Bleeding when  urinating, pressure at bottom of stomach

## 2023-10-19 ENCOUNTER — Ambulatory Visit: Admitting: Family Medicine

## 2023-10-19 VITALS — BP 122/80 | HR 85 | Temp 97.6°F | Ht 59.0 in | Wt 147.0 lb

## 2023-10-19 DIAGNOSIS — R319 Hematuria, unspecified: Secondary | ICD-10-CM | POA: Diagnosis not present

## 2023-10-19 DIAGNOSIS — R1030 Lower abdominal pain, unspecified: Secondary | ICD-10-CM

## 2023-10-19 LAB — POC URINALSYSI DIPSTICK (AUTOMATED)
Bilirubin, UA: NEGATIVE
Blood, UA: POSITIVE
Glucose, UA: NEGATIVE
Ketones, UA: NEGATIVE
Nitrite, UA: POSITIVE
Protein, UA: POSITIVE — AB
Spec Grav, UA: 1.025 (ref 1.010–1.025)
Urobilinogen, UA: >=8 U/dL — AB
pH, UA: 5.5 (ref 5.0–8.0)

## 2023-10-19 MED ORDER — SULFAMETHOXAZOLE-TRIMETHOPRIM 800-160 MG PO TABS: 1.0000 | ORAL_TABLET | Freq: Two times a day (BID) | ORAL | 0 refills | Status: DC

## 2023-10-19 NOTE — Assessment & Plan Note (Addendum)
 Acute... noted after fall ( of note no impact on perineum).  Pt does have history of postmenopausal vaginal dryness/ incontinence.  UA shows LE and RBC... send for culture to rule out UTI...  start empiric treatment with Bactrim double strength 1 tablet twice daily x 3 days  Vaginal  and urethral exam unremarkable.  Patient with likely generalized abdominal soreness from recent fall, but minimal, no rebound.  Pt nontoxic.. unlikely significant urethral or bladder injury for low impact fall. No indication for imaging. Return and ER precautions provided
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
## 2023-10-21 LAB — URINE CULTURE
MICRO NUMBER:: 17084730
SPECIMEN QUALITY:: ADEQUATE

## 2023-10-23 ENCOUNTER — Ambulatory Visit: Payer: Self-pay | Admitting: Family Medicine

## 2023-10-24 ENCOUNTER — Other Ambulatory Visit: Payer: Self-pay | Admitting: Family Medicine

## 2023-10-24 MED ORDER — FLUCONAZOLE 150 MG PO TABS: 150.0000 mg | ORAL_TABLET | Freq: Once | ORAL | 0 refills | Status: AC

## 2023-11-02 ENCOUNTER — Other Ambulatory Visit: Payer: Self-pay | Admitting: Internal Medicine

## 2023-11-17 ENCOUNTER — Other Ambulatory Visit: Payer: Self-pay | Admitting: Internal Medicine

## 2023-11-22 NOTE — Telephone Encounter (Signed)
 Received a request for refill of prescription vitamin D . Notify her that we will hold on prescription vitamin D  and have her start over the counter vitamin D3 1000 units q day. We will follow.

## 2023-11-25 ENCOUNTER — Encounter: Payer: Self-pay | Admitting: Internal Medicine

## 2023-11-26 MED ORDER — EZETIMIBE 10 MG PO TABS: 10.0000 mg | ORAL_TABLET | Freq: Every day | ORAL | 0 refills | Status: DC

## 2023-11-26 MED ORDER — TIRZEPATIDE 7.5 MG/0.5ML ~~LOC~~ SOAJ: 7.5000 mg | SUBCUTANEOUS | 1 refills | Status: DC

## 2023-12-13 ENCOUNTER — Other Ambulatory Visit: Payer: Self-pay | Admitting: Internal Medicine

## 2023-12-13 DIAGNOSIS — I3139 Other pericardial effusion (noninflammatory): Secondary | ICD-10-CM

## 2023-12-13 NOTE — Progress Notes (Signed)
Order placed for ECHO

## 2023-12-26 ENCOUNTER — Encounter: Payer: Self-pay | Admitting: Internal Medicine

## 2023-12-26 ENCOUNTER — Telehealth: Payer: Self-pay

## 2023-12-26 MED ORDER — TIRZEPATIDE 10 MG/0.5ML ~~LOC~~ SOAJ: 10.0000 mg | SUBCUTANEOUS | 1 refills | Status: AC

## 2023-12-26 NOTE — Telephone Encounter (Signed)
 More patients have problems with constipation with these medications. Please confirm no issues with constipation. If no, I am ok to send in the 10mg  dose.

## 2023-12-26 NOTE — Telephone Encounter (Signed)
 Pt requesting to go to next dosage

## 2023-12-26 NOTE — Telephone Encounter (Signed)
Rx sent in for '10mg'$  mounjaro

## 2023-12-26 NOTE — Telephone Encounter (Signed)
 Noted

## 2023-12-26 NOTE — Telephone Encounter (Signed)
 Courtney Donovan called triage asking if I could look in her chart to let her know what year she had her hysterectomy, I advised her it said in 2011.

## 2023-12-31 ENCOUNTER — Ambulatory Visit: Attending: Internal Medicine

## 2023-12-31 DIAGNOSIS — I3139 Other pericardial effusion (noninflammatory): Secondary | ICD-10-CM

## 2023-12-31 LAB — ECHOCARDIOGRAM COMPLETE
AR max vel: 1.29 cm2
AV Area VTI: 1.38 cm2
AV Area mean vel: 1.31 cm2
AV Mean grad: 5 mmHg
AV Peak grad: 9.2 mmHg
Ao pk vel: 1.52 m/s
Area-P 1/2: 3.77 cm2
S' Lateral: 2.48 cm

## 2024-01-01 ENCOUNTER — Ambulatory Visit: Payer: Self-pay | Admitting: Internal Medicine

## 2024-01-07 ENCOUNTER — Ambulatory Visit: Admitting: Internal Medicine

## 2024-01-08 ENCOUNTER — Other Ambulatory Visit: Payer: Self-pay | Admitting: Internal Medicine

## 2024-01-09 MED ORDER — EZETIMIBE 10 MG PO TABS: 10.0000 mg | ORAL_TABLET | Freq: Every day | ORAL | 0 refills | Status: AC

## 2024-01-09 NOTE — Addendum Note (Signed)
 Addended by: Burhan Barham on: 01/09/2024 08:28 AM   Modules accepted: Orders

## 2024-01-13 NOTE — Telephone Encounter (Signed)
 I tried to call her and her phone went straight to voicemail and then stated voice mail full. Please see me before calling. I do think this is a good idea and I can explain more about it if questions. See me before calling to be able to relay this information.

## 2024-01-14 ENCOUNTER — Other Ambulatory Visit (INDEPENDENT_AMBULATORY_CARE_PROVIDER_SITE_OTHER)

## 2024-01-14 DIAGNOSIS — E7800 Pure hypercholesterolemia, unspecified: Secondary | ICD-10-CM

## 2024-01-14 DIAGNOSIS — E1165 Type 2 diabetes mellitus with hyperglycemia: Secondary | ICD-10-CM | POA: Diagnosis not present

## 2024-01-14 DIAGNOSIS — Z794 Long term (current) use of insulin: Secondary | ICD-10-CM | POA: Diagnosis not present

## 2024-01-14 LAB — MICROALBUMIN / CREATININE URINE RATIO
Creatinine,U: 210.2 mg/dL
Microalb Creat Ratio: 12.9 mg/g (ref 0.0–30.0)
Microalb, Ur: 2.7 mg/dL — ABNORMAL HIGH (ref 0.7–1.9)

## 2024-01-14 LAB — BASIC METABOLIC PANEL WITH GFR
BUN: 15 mg/dL (ref 6–23)
CO2: 32 meq/L (ref 19–32)
Calcium: 9 mg/dL (ref 8.4–10.5)
Chloride: 105 meq/L (ref 96–112)
Creatinine, Ser: 0.77 mg/dL (ref 0.40–1.20)
GFR: 83.19 mL/min
Glucose, Bld: 91 mg/dL (ref 70–99)
Potassium: 4.1 meq/L (ref 3.5–5.1)
Sodium: 140 meq/L (ref 135–145)

## 2024-01-14 LAB — HEPATIC FUNCTION PANEL
ALT: 14 U/L (ref 3–35)
AST: 15 U/L (ref 5–37)
Albumin: 4.3 g/dL (ref 3.5–5.2)
Alkaline Phosphatase: 85 U/L (ref 39–117)
Bilirubin, Direct: 0.1 mg/dL (ref 0.1–0.3)
Total Bilirubin: 0.4 mg/dL (ref 0.2–1.2)
Total Protein: 6.6 g/dL (ref 6.0–8.3)

## 2024-01-14 LAB — LIPID PANEL
Cholesterol: 162 mg/dL (ref 28–200)
HDL: 44.5 mg/dL
LDL Cholesterol: 103 mg/dL — ABNORMAL HIGH (ref 10–99)
NonHDL: 117.82
Total CHOL/HDL Ratio: 4
Triglycerides: 73 mg/dL (ref 10.0–149.0)
VLDL: 14.6 mg/dL (ref 0.0–40.0)

## 2024-01-14 LAB — HEMOGLOBIN A1C: Hgb A1c MFr Bld: 5.6 % (ref 4.6–6.5)

## 2024-01-14 NOTE — Telephone Encounter (Signed)
 Discuss at appt 01/17/24.

## 2024-01-15 NOTE — Telephone Encounter (Signed)
 Copied from CRM [id number]. Topic: General - Other >> Jan 15, 2024 11:27 AM Adelita E wrote: Reason for CRM: Patient returning call from Daijah, relayed message from PCP in regards to the [employer] DNA testing, patient did not relay that she has further questions, but will discuss more about it at her next appointment that is on 01/17/2024.

## 2024-01-15 NOTE — Telephone Encounter (Signed)
 Called pt. Unable to leave vm due to vm being full. Okay to relay message from dr. Glendia. There is two encounters

## 2024-01-15 NOTE — Telephone Encounter (Signed)
 Pt spoke to [id number] see telephone encounter

## 2024-01-17 ENCOUNTER — Encounter: Payer: Self-pay | Admitting: Internal Medicine

## 2024-01-17 ENCOUNTER — Ambulatory Visit: Admitting: Internal Medicine

## 2024-01-17 VITALS — BP 116/70 | HR 70 | Temp 97.3°F | Ht 59.0 in | Wt 139.6 lb

## 2024-01-17 DIAGNOSIS — K219 Gastro-esophageal reflux disease without esophagitis: Secondary | ICD-10-CM

## 2024-01-17 DIAGNOSIS — F439 Reaction to severe stress, unspecified: Secondary | ICD-10-CM

## 2024-01-17 DIAGNOSIS — I10 Essential (primary) hypertension: Secondary | ICD-10-CM | POA: Diagnosis not present

## 2024-01-17 DIAGNOSIS — E1165 Type 2 diabetes mellitus with hyperglycemia: Secondary | ICD-10-CM

## 2024-01-17 DIAGNOSIS — E559 Vitamin D deficiency, unspecified: Secondary | ICD-10-CM

## 2024-01-17 DIAGNOSIS — Z794 Long term (current) use of insulin: Secondary | ICD-10-CM

## 2024-01-17 DIAGNOSIS — K5900 Constipation, unspecified: Secondary | ICD-10-CM

## 2024-01-17 DIAGNOSIS — G4733 Obstructive sleep apnea (adult) (pediatric): Secondary | ICD-10-CM | POA: Diagnosis not present

## 2024-01-17 DIAGNOSIS — E7800 Pure hypercholesterolemia, unspecified: Secondary | ICD-10-CM | POA: Diagnosis not present

## 2024-01-17 MED ORDER — EZETIMIBE 10 MG PO TABS: 10.0000 mg | ORAL_TABLET | Freq: Every day | ORAL | 1 refills | Status: AC

## 2024-01-17 MED ORDER — OMEPRAZOLE 20 MG PO CPDR: 20.0000 mg | DELAYED_RELEASE_CAPSULE | Freq: Every day | ORAL | 1 refills | Status: AC

## 2024-01-17 NOTE — Progress Notes (Unsigned)
 "  Subjective:    Patient ID: Courtney Donovan, female    DOB: Mar 26, 1960, 64 y.o.   MRN: [id number]  Patient here for  Chief Complaint  Patient presents with   Medical Management of Chronic Issues    HPI Here for a scheduled follow up - follow up regarding sleep apnea, hypercholesterolemia, hypertension and diabetes. Continues cpap. Continues on mounjaro . Evaluated by GI 08/06/23 - upper symptoms - better on protonix . Dose decreased to 20mg . Recommended linzess to help with constipation. She reports she is now being followed - [facility]. Has started estrogen patch and progesterone as well as glutathione. Reviewed outside sugars - most averaging 80-90s. No problem with low sugars. Blood pressures averaging 110-120s/70-80.    Past Medical History:  Diagnosis Date   Allergy    Anemia    Chronic headaches    Diabetes mellitus (HCC)    diet controlled   Hypercholesterolemia    Hypertension    Past Surgical History:  Procedure Laterality Date   ABDOMINAL HYSTERECTOMY  2011   supracervical hysterectomy   TONSILECTOMY/ADENOIDECTOMY WITH MYRINGOTOMY  1067   TONSILLECTOMY     TUBAL LIGATION  1996   Family History  Problem Relation Age of Onset   Hypertension Mother    Cancer Father        lung   Hypertension Father    Breast cancer Neg Hx    Social History   Socioeconomic History   Marital status: Married    Spouse name: Not on file   Number of children: 2   Years of education: Not on file   Highest education level: Not on file  Occupational History    Employer: [employer]  Tobacco Use   Smoking status: Never   Smokeless tobacco: Never  Vaping Use   Vaping status: Never Used  Substance and Sexual Activity   Alcohol use: No    Alcohol/week: 0.0 standard drinks of alcohol   Drug use: No   Sexual activity: Yes  Other Topics Concern   Not on file  Social History Narrative   Not on file   Social Drivers of Health   Tobacco Use: Low Risk  (01/23/2024)   Patient History    Smoking Tobacco Use: Never    Smokeless Tobacco Use: Never    Passive Exposure: Not on file  Financial Resource Strain: Low Risk  (01/26/2023)   Received from [facility]   Overall Financial Resource Strain (CARDIA)    Difficulty of Paying Living Expenses: Not hard at all  Food Insecurity: No Food Insecurity (01/26/2023)   Received from [facility]   Epic    Within the past 12 months, you worried that your food would run out before you got the money to buy more.: Never true    Within the past 12 months, the food you bought just didn't last and you didn't have money to get more.: Never true  Transportation Needs: No Transportation Needs (01/26/2023)   Received from [facility] - Transportation    In the past 12 months, has lack of transportation kept you from medical appointments or from getting medications?: No    Lack of Transportation (Non-Medical): No  Physical Activity: Not on file  Stress: Not on file  Social Connections: Not on file  Depression (EYV7-0): Low Risk (10/19/2023)   Depression (PHQ2-9)    PHQ-2 Score: 2  Alcohol Screen: Not on file  Housing: Low Risk  (  01/26/2023)   Received from [facility]   Epic    In the last 12 months, was there a time when you were not able to pay the mortgage or rent on time?: No    In the past 12 months, how many times have you moved where you were living?: 0    At any time in the past 12 months, were you homeless or living in a shelter (including now)?: No  Utilities: Not At Risk (01/26/2023)   Received from [facility]ities    Threatened with loss of utilities: No  Health Literacy: Not on file     Review of Systems  Constitutional:  Negative for appetite change and unexpected weight change.  HENT:  Negative for congestion and sinus pressure.   Respiratory:  Negative for cough, chest tightness  and shortness of breath.   Cardiovascular:  Negative for chest pain, palpitations and leg swelling.  Gastrointestinal:  Negative for abdominal pain, diarrhea, nausea and vomiting.  Genitourinary:  Negative for difficulty urinating and dysuria.  Musculoskeletal:  Negative for joint swelling and myalgias.  Skin:  Negative for color change and rash.  Neurological:  Negative for dizziness and headaches.  Psychiatric/Behavioral:  Negative for agitation and dysphoric mood.        Objective:     BP 116/70   Pulse 70   Temp (!) 97.3 F (36.3 C) (Oral)   Ht 4' 11 (1.499 m)   Wt 139 lb 9.6 oz (63.3 kg)   SpO2 99%   BMI 28.20 kg/m  Wt Readings from Last 3 Encounters:  01/17/24 139 lb 9.6 oz (63.3 kg)  10/19/23 147 lb (66.7 kg)  09/06/23 149 lb 12.8 oz (67.9 kg)    Physical Exam Vitals reviewed.  Constitutional:      General: She is not in acute distress.    Appearance: Normal appearance.  HENT:     Head: Normocephalic and atraumatic.     Right Ear: External ear normal.     Left Ear: External ear normal.     Mouth/Throat:     Pharynx: No oropharyngeal exudate or posterior oropharyngeal erythema.  Eyes:     General: No scleral icterus.       Right eye: No discharge.        Left eye: No discharge.     Conjunctiva/sclera: Conjunctivae normal.  Neck:     Thyroid : No thyromegaly.  Cardiovascular:     Rate and Rhythm: Normal rate and regular rhythm.  Pulmonary:     Effort: No respiratory distress.     Breath sounds: Normal breath sounds. No wheezing.  Abdominal:     General: Bowel sounds are normal.     Palpations: Abdomen is soft.     Tenderness: There is no abdominal tenderness.  Musculoskeletal:        General: No swelling or tenderness.     Cervical back: Neck supple. No tenderness.  Lymphadenopathy:     Cervical: No cervical adenopathy.  Skin:    Findings: No erythema or rash.  Neurological:     Mental Status: She is alert.  Psychiatric:        Mood and Affect:  Mood normal.        Behavior: Behavior normal.         Outpatient Encounter Medications as of 01/17/2024  Medication Sig   amLODipine  (NORVASC ) 5 MG tablet TAKE 1 TABLET BY MOUTH DAILY   desonide  (DESOWEN ) 0.05 %  cream Apply topically 2 (two) times daily as needed (Rash). For 2 weeks   diclofenac  Sodium (VOLTAREN ) 1 % GEL Apply 2 g topically 4 (four) times daily as needed.   DOTTI  0.025 MG/24HR 1 patch.   estradiol  (ESTRACE ) 0.1 MG/GM vaginal cream Place 0.25 Applicatorfuls vaginally 2 (two) times a week.   ezetimibe  (ZETIA ) 10 MG tablet Take 1 tablet (10 mg total) by mouth daily.   fluticasone  (FLONASE ) 50 MCG/ACT nasal spray Place 2 sprays into both nostrils daily.   glucose blood test strip Use as instructed to check blood sugars once daily.   linaclotide (LINZESS) 72 MCG capsule Take 72 mcg by mouth.   LORazepam  (ATIVAN ) 0.5 MG tablet TAKE 1/2 (ONE-HALF) TABLET BY MOUTH ONCE DAILY AS NEEDED   magnesium  oxide (MAG-OX) 400 (240 Mg) MG tablet Take 1 tablet (400 mg total) by mouth daily.   Multiple Vitamin (MULTIVITAMIN) tablet Take 1 tablet by mouth daily.   nystatin  cream (MYCOSTATIN ) Apply 1 Application topically 2 (two) times daily.   pantoprazole  (PROTONIX ) 20 MG tablet Take 20 mg by mouth daily.   progesterone (PROMETRIUM) 100 MG capsule Take 100 mg by mouth at bedtime.   Rimegepant Sulfate (NURTEC) 75 MG TBDP Take 75 mg by mouth daily as needed.   rosuvastatin  (CRESTOR ) 10 MG tablet Take 10 mg by mouth at bedtime.   tirzepatide  (MOUNJARO ) 10 MG/0.5ML Pen Inject 10 mg into the skin once a week.   tiZANidine  (ZANAFLEX ) 2 MG tablet Take 1 tablet (2 mg total) by mouth 2 (two) times daily as needed for muscle spasms.   Vitamin D , Ergocalciferol , (DRISDOL ) 1.25 MG (50000 UNIT) CAPS capsule Take 1 capsule by mouth once a week   aspirin 81 MG chewable tablet Chew 81 mg by mouth daily. (Patient not taking: Reported on 01/17/2024)   ezetimibe  (ZETIA ) 10 MG tablet Take 1 tablet (10 mg total)  by mouth daily.   omeprazole  (PRILOSEC) 20 MG capsule Take 1 capsule (20 mg total) by mouth daily.   oxybutynin  (DITROPAN -XL) 5 MG 24 hr tablet Take 1 tablet (5 mg total) by mouth at bedtime. (Patient not taking: Reported on 01/17/2024)   [DISCONTINUED] ezetimibe  (ZETIA ) 10 MG tablet Take 1 tablet (10 mg total) by mouth daily.   [DISCONTINUED] omeprazole  (PRILOSEC) 20 MG capsule Take 1 capsule (20 mg total) by mouth daily.   [DISCONTINUED] ondansetron  (ZOFRAN ) 4 MG tablet Take 1 tablet (4 mg total) by mouth 2 (two) times daily as needed for nausea or vomiting. (Patient not taking: Reported on 01/17/2024)   [DISCONTINUED] sulfamethoxazole -trimethoprim  (BACTRIM  DS) 800-160 MG tablet Take 1 tablet by mouth 2 (two) times daily.   No facility-administered encounter medications on file as of 01/17/2024.     Lab Results  Component Value Date   WBC 6.2 06/24/2023   HGB 13.4 06/24/2023   HCT 42.0 06/24/2023   PLT 269 06/24/2023   GLUCOSE 91 01/14/2024   CHOL 162 01/14/2024   TRIG 73.0 01/14/2024   HDL 44.50 01/14/2024   LDLDIRECT 159.5 01/16/2013   LDLCALC 103 (H) 01/14/2024   ALT 14 01/14/2024   AST 15 01/14/2024   NA 140 01/14/2024   K 4.1 01/14/2024   CL 105 01/14/2024   CREATININE 0.77 01/14/2024   BUN 15 01/14/2024   CO2 32 01/14/2024   TSH 1.83 05/31/2023   HGBA1C 5.6 01/14/2024   MICROALBUR 2.7 (H) 01/14/2024    CT CARDIAC SCORING Addendum Date: 10/14/2023 ADDENDUM REPORT: 10/14/2023 11:18 EXAM: OVER-READ INTERPRETATION  CT CHEST The  following report is an over-read performed by radiologist Dr. Andrea Gasman of [facility] on 10/14/2023. This over-read does not include interpretation of cardiac or coronary anatomy or pathology. The coronary calcium  score interpretation by the cardiologist is attached. COMPARISON:  None. FINDINGS: Vascular: The included aorta is normal in caliber. Mediastinum/nodes: Small calcified left hilar lymph nodes consistent with prior granulomatous  disease. Unremarkable esophagus. Lungs: No focal airspace disease. No pulmonary nodule. No pleural fluid. The included airways are patent. Upper abdomen: No acute or unexpected findings. Musculoskeletal: There are no acute or suspicious osseous abnormalities. IMPRESSION: No acute or unexpected extracardiac findings. Electronically Signed   By: Andrea Gasman M.D.   On: 10/14/2023 11:18   Result Date: 10/14/2023 CLINICAL DATA:  Risk stratification EXAM: Coronary Calcium  Score TECHNIQUE: The patient was scanned on a Siemens Somatom scanner. Axial non-contrast 3 mm slices were carried out through the heart. The data set was analyzed on a dedicated work station and scored using the Agatson method. FINDINGS: Non-cardiac: See separate report from [facility]. Ascending Aorta: Normal size.  Mild calcifications of the right STJ. Pericardium: Trivial effusion. Coronary arteries: Normal origin of left and right coronary arteries. Distribution of arterial calcifications if present, as noted below; LM 0 LAD 0 LCx 0 RCA 0 Total 0 IMPRESSION AND RECOMMENDATION: 1. Coronary calcium  score of 0. CAC 0, CAC-DRS A0. CAC-DRS A0: CAC score 0: Statin generally not recommended unless other high risk condition (e.g., FH, DM2, tobacco use, etc.) CAC-DRS A1: CAC score 1-99: moderate intensity statin CAC-DRS A2: CAC score 100-299: moderate to high intensity statin + ASA 81mg  CAC-DRS A3: CAC score > 300: high intensity statin + ASA 81mg  Continue heart healthy lifestyle and risk factor modification. Electronically Signed: By: Caron Poser On: 10/08/2023 11:05       Assessment & Plan:  Vitamin D  deficiency Assessment & Plan: Continue vitamin D  supplements.    Hypercholesterolemia Assessment & Plan: Off crestor . Caused muscle aches. Low cholesterol diet and exercise.  Continue zetia . Follow lipid panel.  Lab Results  Component Value Date   CHOL 162 01/14/2024   HDL 44.50 01/14/2024   LDLCALC 103 (H) 01/14/2024    LDLDIRECT 159.5 01/16/2013   TRIG 73.0 01/14/2024   CHOLHDL 4 01/14/2024    Orders: -     Basic metabolic panel with GFR; Future -     Lipid panel; Future -     Lipid panel; Future  Type 2 diabetes mellitus with hyperglycemia, with long-term current use of insulin (HCC) Assessment & Plan: Continues on mounjaro . Sugars as outlined. Reviewed outside sugar readings. Continue diet and exercise.  Follow met b and A1c.   Lab Results  Component Value Date   HGBA1C 5.6 01/14/2024     Orders: -     Hemoglobin A1c; Future  Primary hypertension Assessment & Plan: Continue amlodipine . Follow pressures. Follow metabolic panel.  No change in medication today. Reviewed outside readings.   Orders: -     CBC with Differential/Platelet; Future  Stress Assessment & Plan: Overall appears to be doing well. Follow.    OSA (obstructive sleep apnea) Assessment & Plan: Continue cpap.    Gastroesophageal reflux disease, unspecified whether esophagitis present Assessment & Plan: Continue omeprazole . Upper symptoms appear to be controlled. On 20mg . Follow.    Constipation, unspecified constipation type Assessment & Plan: No increased bowel issues reported. Saw GI as outlined.    Other orders -     Ezetimibe ; Take 1 tablet (10 mg total) by  mouth daily.  Dispense: 90 tablet; Refill: 1 -     Omeprazole ; Take 1 capsule (20 mg total) by mouth daily.  Dispense: 90 capsule; Refill: 1     Allena Hamilton, MD "

## 2024-01-23 ENCOUNTER — Encounter: Payer: Self-pay | Admitting: Internal Medicine

## 2024-01-23 NOTE — Assessment & Plan Note (Signed)
 Off crestor . Caused muscle aches. Low cholesterol diet and exercise.  Continue zetia . Follow lipid panel.  Lab Results  Component Value Date   CHOL 162 01/14/2024   HDL 44.50 01/14/2024   LDLCALC 103 (H) 01/14/2024   LDLDIRECT 159.5 01/16/2013   TRIG 73.0 01/14/2024   CHOLHDL 4 01/14/2024

## 2024-01-23 NOTE — Assessment & Plan Note (Signed)
 Continues on mounjaro . Sugars as outlined. Reviewed outside sugar readings. Continue diet and exercise.  Follow met b and A1c.   Lab Results  Component Value Date   HGBA1C 5.6 01/14/2024

## 2024-01-23 NOTE — Assessment & Plan Note (Signed)
-  Continue cpap

## 2024-01-23 NOTE — Assessment & Plan Note (Signed)
 Continue omeprazole . Upper symptoms appear to be controlled. On 20mg . Follow.

## 2024-01-23 NOTE — Assessment & Plan Note (Signed)
 Overall appears to be doing well.  Follow.

## 2024-01-23 NOTE — Assessment & Plan Note (Signed)
 Continue vitamin D  supplements.

## 2024-01-23 NOTE — Assessment & Plan Note (Signed)
 No increased bowel issues reported. Saw GI as outlined.

## 2024-01-23 NOTE — Assessment & Plan Note (Signed)
 Continue amlodipine . Follow pressures. Follow metabolic panel.  No change in medication today. Reviewed outside readings.

## 2024-01-24 ENCOUNTER — Encounter: Payer: Self-pay | Admitting: Sleep Medicine

## 2024-01-24 NOTE — Telephone Encounter (Signed)
 SABRA

## 2024-05-16 ENCOUNTER — Other Ambulatory Visit

## 2024-05-20 ENCOUNTER — Ambulatory Visit: Admitting: Internal Medicine
